# Patient Record
Sex: Female | Born: 1970 | Race: White | Hispanic: No | Marital: Married | Smoking: Current every day smoker
Health system: Southern US, Community
[De-identification: ages and names within clinical notes are randomized; demographics above are authoritative.]

## PROBLEM LIST (undated history)

## (undated) DIAGNOSIS — K209 Esophagitis, unspecified without bleeding: Secondary | ICD-10-CM

## (undated) DIAGNOSIS — F1010 Alcohol abuse, uncomplicated: Secondary | ICD-10-CM

## (undated) DIAGNOSIS — Z972 Presence of dental prosthetic device (complete) (partial): Secondary | ICD-10-CM

## (undated) DIAGNOSIS — I739 Peripheral vascular disease, unspecified: Secondary | ICD-10-CM

## (undated) DIAGNOSIS — I214 Non-ST elevation (NSTEMI) myocardial infarction: Secondary | ICD-10-CM

## (undated) DIAGNOSIS — Z85810 Personal history of malignant neoplasm of tongue: Secondary | ICD-10-CM

## (undated) DIAGNOSIS — Z87898 Personal history of other specified conditions: Secondary | ICD-10-CM

## (undated) DIAGNOSIS — Z955 Presence of coronary angioplasty implant and graft: Secondary | ICD-10-CM

## (undated) DIAGNOSIS — Z720 Tobacco use: Secondary | ICD-10-CM

## (undated) HISTORY — PX: TONGUE SURGERY: SHX810

## (undated) HISTORY — PX: BILATERAL SALPINGECTOMY: SHX5743

## (undated) HISTORY — DX: Peripheral vascular disease, unspecified: I73.9

---

## 1998-04-29 ENCOUNTER — Emergency Department (HOSPITAL_COMMUNITY): Admission: EM | Admit: 1998-04-29 | Discharge: 1998-04-29 | Payer: Self-pay | Admitting: Emergency Medicine

## 1998-05-06 ENCOUNTER — Emergency Department (HOSPITAL_COMMUNITY): Admission: EM | Admit: 1998-05-06 | Discharge: 1998-05-06 | Payer: Self-pay | Admitting: Emergency Medicine

## 2000-02-16 ENCOUNTER — Other Ambulatory Visit: Admission: RE | Admit: 2000-02-16 | Discharge: 2000-02-16 | Payer: Self-pay | Admitting: Obstetrics

## 2002-03-15 ENCOUNTER — Other Ambulatory Visit: Admission: RE | Admit: 2002-03-15 | Discharge: 2002-03-15 | Payer: Self-pay | Admitting: Family Medicine

## 2007-06-23 ENCOUNTER — Emergency Department (HOSPITAL_COMMUNITY): Admission: EM | Admit: 2007-06-23 | Discharge: 2007-06-23 | Payer: Self-pay | Admitting: Emergency Medicine

## 2017-07-15 DIAGNOSIS — I10 Essential (primary) hypertension: Secondary | ICD-10-CM | POA: Insufficient documentation

## 2017-07-15 DIAGNOSIS — R002 Palpitations: Secondary | ICD-10-CM | POA: Insufficient documentation

## 2017-07-15 DIAGNOSIS — Z87898 Personal history of other specified conditions: Secondary | ICD-10-CM | POA: Insufficient documentation

## 2017-07-15 HISTORY — DX: Palpitations: R00.2

## 2017-07-16 DIAGNOSIS — I4719 Other supraventricular tachycardia: Secondary | ICD-10-CM | POA: Insufficient documentation

## 2017-07-16 HISTORY — DX: Other supraventricular tachycardia: I47.19

## 2017-08-01 DIAGNOSIS — R6510 Systemic inflammatory response syndrome (SIRS) of non-infectious origin without acute organ dysfunction: Secondary | ICD-10-CM | POA: Insufficient documentation

## 2017-08-01 HISTORY — DX: Systemic inflammatory response syndrome (sirs) of non-infectious origin without acute organ dysfunction: R65.10

## 2017-09-30 DIAGNOSIS — Z860100 Personal history of colon polyps, unspecified: Secondary | ICD-10-CM | POA: Insufficient documentation

## 2017-11-24 DIAGNOSIS — Z87898 Personal history of other specified conditions: Secondary | ICD-10-CM | POA: Insufficient documentation

## 2017-11-24 DIAGNOSIS — I639 Cerebral infarction, unspecified: Secondary | ICD-10-CM | POA: Insufficient documentation

## 2017-12-02 DIAGNOSIS — G629 Polyneuropathy, unspecified: Secondary | ICD-10-CM | POA: Insufficient documentation

## 2017-12-02 DIAGNOSIS — I6783 Posterior reversible encephalopathy syndrome: Secondary | ICD-10-CM

## 2017-12-02 HISTORY — DX: Polyneuropathy, unspecified: G62.9

## 2017-12-02 HISTORY — DX: Posterior reversible encephalopathy syndrome: I67.83

## 2017-12-22 DIAGNOSIS — R569 Unspecified convulsions: Secondary | ICD-10-CM | POA: Insufficient documentation

## 2017-12-22 HISTORY — DX: Unspecified convulsions: R56.9

## 2018-01-01 DIAGNOSIS — F39 Unspecified mood [affective] disorder: Secondary | ICD-10-CM | POA: Insufficient documentation

## 2018-01-01 DIAGNOSIS — R45851 Suicidal ideations: Secondary | ICD-10-CM | POA: Insufficient documentation

## 2018-01-01 DIAGNOSIS — F339 Major depressive disorder, recurrent, unspecified: Secondary | ICD-10-CM

## 2018-01-01 HISTORY — DX: Major depressive disorder, recurrent, unspecified: F33.9

## 2018-01-01 HISTORY — DX: Suicidal ideations: R45.851

## 2018-01-01 HISTORY — DX: Unspecified mood (affective) disorder: F39

## 2018-01-21 ENCOUNTER — Ambulatory Visit: Payer: Self-pay | Admitting: Physician Assistant

## 2018-06-27 DIAGNOSIS — C040 Malignant neoplasm of anterior floor of mouth: Secondary | ICD-10-CM | POA: Insufficient documentation

## 2018-07-15 DIAGNOSIS — C049 Malignant neoplasm of floor of mouth, unspecified: Secondary | ICD-10-CM

## 2018-07-15 HISTORY — DX: Malignant neoplasm of floor of mouth, unspecified: C04.9

## 2021-09-04 DIAGNOSIS — I2510 Atherosclerotic heart disease of native coronary artery without angina pectoris: Secondary | ICD-10-CM

## 2021-09-04 HISTORY — DX: Atherosclerotic heart disease of native coronary artery without angina pectoris: I25.10

## 2021-09-07 ENCOUNTER — Emergency Department: Payer: 59

## 2021-09-07 ENCOUNTER — Encounter: Payer: Self-pay | Admitting: Emergency Medicine

## 2021-09-07 ENCOUNTER — Other Ambulatory Visit: Payer: Self-pay

## 2021-09-07 ENCOUNTER — Inpatient Hospital Stay
Admission: EM | Admit: 2021-09-07 | Discharge: 2021-09-11 | DRG: 247 | Disposition: A | Discharge status: Home / Self Care | Payer: 59 | Attending: Hospitalist | Admitting: Hospitalist

## 2021-09-07 DIAGNOSIS — Z85810 Personal history of malignant neoplasm of tongue: Secondary | ICD-10-CM

## 2021-09-07 DIAGNOSIS — F1010 Alcohol abuse, uncomplicated: Secondary | ICD-10-CM

## 2021-09-07 DIAGNOSIS — E871 Hypo-osmolality and hyponatremia: Secondary | ICD-10-CM

## 2021-09-07 DIAGNOSIS — I25111 Atherosclerotic heart disease of native coronary artery with angina pectoris with documented spasm: Principal | ICD-10-CM

## 2021-09-07 DIAGNOSIS — R1084 Generalized abdominal pain: Secondary | ICD-10-CM | POA: Diagnosis not present

## 2021-09-07 DIAGNOSIS — Z79899 Other long term (current) drug therapy: Secondary | ICD-10-CM

## 2021-09-07 DIAGNOSIS — I2584 Coronary atherosclerosis due to calcified coronary lesion: Secondary | ICD-10-CM | POA: Diagnosis present

## 2021-09-07 DIAGNOSIS — F419 Anxiety disorder, unspecified: Secondary | ICD-10-CM | POA: Diagnosis present

## 2021-09-07 DIAGNOSIS — Z20822 Contact with and (suspected) exposure to covid-19: Secondary | ICD-10-CM | POA: Diagnosis present

## 2021-09-07 DIAGNOSIS — L7632 Postprocedural hematoma of skin and subcutaneous tissue following other procedure: Secondary | ICD-10-CM | POA: Diagnosis not present

## 2021-09-07 DIAGNOSIS — E162 Hypoglycemia, unspecified: Secondary | ICD-10-CM

## 2021-09-07 DIAGNOSIS — M79605 Pain in left leg: Secondary | ICD-10-CM

## 2021-09-07 DIAGNOSIS — I2510 Atherosclerotic heart disease of native coronary artery without angina pectoris: Secondary | ICD-10-CM

## 2021-09-07 DIAGNOSIS — K551 Chronic vascular disorders of intestine: Secondary | ICD-10-CM

## 2021-09-07 DIAGNOSIS — Z716 Tobacco abuse counseling: Secondary | ICD-10-CM

## 2021-09-07 DIAGNOSIS — R710 Precipitous drop in hematocrit: Secondary | ICD-10-CM | POA: Diagnosis not present

## 2021-09-07 DIAGNOSIS — K760 Fatty (change of) liver, not elsewhere classified: Secondary | ICD-10-CM

## 2021-09-07 DIAGNOSIS — E872 Acidosis, unspecified: Secondary | ICD-10-CM

## 2021-09-07 DIAGNOSIS — I10 Essential (primary) hypertension: Secondary | ICD-10-CM | POA: Diagnosis present

## 2021-09-07 DIAGNOSIS — I739 Peripheral vascular disease, unspecified: Secondary | ICD-10-CM

## 2021-09-07 DIAGNOSIS — Z758 Other problems related to medical facilities and other health care: Secondary | ICD-10-CM | POA: Diagnosis not present

## 2021-09-07 DIAGNOSIS — F17200 Nicotine dependence, unspecified, uncomplicated: Secondary | ICD-10-CM

## 2021-09-07 DIAGNOSIS — I200 Unstable angina: Secondary | ICD-10-CM

## 2021-09-07 DIAGNOSIS — R109 Unspecified abdominal pain: Secondary | ICD-10-CM | POA: Diagnosis present

## 2021-09-07 DIAGNOSIS — E785 Hyperlipidemia, unspecified: Secondary | ICD-10-CM | POA: Diagnosis present

## 2021-09-07 DIAGNOSIS — F17210 Nicotine dependence, cigarettes, uncomplicated: Secondary | ICD-10-CM | POA: Diagnosis present

## 2021-09-07 DIAGNOSIS — K2970 Gastritis, unspecified, without bleeding: Secondary | ICD-10-CM | POA: Diagnosis present

## 2021-09-07 DIAGNOSIS — M79604 Pain in right leg: Secondary | ICD-10-CM

## 2021-09-07 DIAGNOSIS — G629 Polyneuropathy, unspecified: Secondary | ICD-10-CM | POA: Diagnosis present

## 2021-09-07 DIAGNOSIS — R079 Chest pain, unspecified: Principal | ICD-10-CM

## 2021-09-07 DIAGNOSIS — Z880 Allergy status to penicillin: Secondary | ICD-10-CM

## 2021-09-07 HISTORY — DX: Personal history of malignant neoplasm of tongue: Z85.810

## 2021-09-07 HISTORY — DX: Alcohol abuse, uncomplicated: F10.10

## 2021-09-07 HISTORY — DX: Tobacco use: Z72.0

## 2021-09-07 LAB — CBC WITH DIFFERENTIAL/PLATELET
Abs Immature Granulocytes: 0.02 10*3/uL (ref 0.00–0.07)
Basophils Absolute: 0.1 10*3/uL (ref 0.0–0.1)
Basophils Relative: 1 %
Eosinophils Absolute: 0.1 10*3/uL (ref 0.0–0.5)
Eosinophils Relative: 1 %
HCT: 41.2 % (ref 36.0–46.0)
Hemoglobin: 14.4 g/dL (ref 12.0–15.0)
Immature Granulocytes: 0 %
Lymphocytes Relative: 21 %
Lymphs Abs: 1.9 10*3/uL (ref 0.7–4.0)
MCH: 33.2 pg (ref 26.0–34.0)
MCHC: 35 g/dL (ref 30.0–36.0)
MCV: 94.9 fL (ref 80.0–100.0)
Monocytes Absolute: 0.5 10*3/uL (ref 0.1–1.0)
Monocytes Relative: 6 %
Neutro Abs: 6.3 10*3/uL (ref 1.7–7.7)
Neutrophils Relative %: 71 %
Platelets: 338 10*3/uL (ref 150–400)
RBC: 4.34 MIL/uL (ref 3.87–5.11)
RDW: 13 % (ref 11.5–15.5)
WBC: 8.9 10*3/uL (ref 4.0–10.5)
nRBC: 0 % (ref 0.0–0.2)

## 2021-09-07 LAB — COMPREHENSIVE METABOLIC PANEL
ALT: 74 U/L — ABNORMAL HIGH (ref 0–44)
AST: 121 U/L — ABNORMAL HIGH (ref 15–41)
Albumin: 4.7 g/dL (ref 3.5–5.0)
Alkaline Phosphatase: 76 U/L (ref 38–126)
Anion gap: 10 (ref 5–15)
BUN: 6 mg/dL (ref 6–20)
CO2: 22 mmol/L (ref 22–32)
Calcium: 9.1 mg/dL (ref 8.9–10.3)
Chloride: 100 mmol/L (ref 98–111)
Creatinine, Ser: 0.6 mg/dL (ref 0.44–1.00)
GFR, Estimated: >60 mL/min (ref >60)
Glucose, Bld: 124 mg/dL — ABNORMAL HIGH (ref 70–99)
Potassium: 3.6 mmol/L (ref 3.5–5.1)
Sodium: 132 mmol/L — ABNORMAL LOW (ref 135–145)
Total Bilirubin: 1 mg/dL (ref 0.3–1.2)
Total Protein: 8.2 g/dL — ABNORMAL HIGH (ref 6.5–8.1)

## 2021-09-07 LAB — BRAIN NATRIURETIC PEPTIDE: B Natriuretic Peptide: 30.4 pg/mL (ref 0.0–100.0)

## 2021-09-07 LAB — URINALYSIS, ROUTINE W REFLEX MICROSCOPIC
Bilirubin Urine: NEGATIVE
Glucose, UA: NEGATIVE mg/dL
Hgb urine dipstick: NEGATIVE
Ketones, ur: NEGATIVE mg/dL
Leukocytes,Ua: NEGATIVE
Nitrite: NEGATIVE
Protein, ur: NEGATIVE mg/dL
Specific Gravity, Urine: 1.001 — ABNORMAL LOW (ref 1.005–1.030)
pH: 6 (ref 5.0–8.0)

## 2021-09-07 LAB — LIPASE, BLOOD: Lipase: 33 U/L (ref 11–51)

## 2021-09-07 LAB — APTT: aPTT: 31 seconds (ref 24–36)

## 2021-09-07 LAB — PROTIME-INR
INR: 1 (ref 0.8–1.2)
Prothrombin Time: 13.1 seconds (ref 11.4–15.2)

## 2021-09-07 LAB — MAGNESIUM: Magnesium: 2.1 mg/dL (ref 1.7–2.4)

## 2021-09-07 LAB — SARS CORONAVIRUS 2 BY RT PCR: SARS Coronavirus 2 by RT PCR: NEGATIVE

## 2021-09-07 LAB — TROPONIN I (HIGH SENSITIVITY)
Troponin I (High Sensitivity): 6 ng/L (ref <18)
Troponin I (High Sensitivity): 7 ng/L (ref <18)

## 2021-09-07 LAB — POC URINE PREG, ED: Preg Test, Ur: NEGATIVE

## 2021-09-07 MED ORDER — MORPHINE SULFATE (PF) 2 MG/ML IV SOLN
2.0000 mg | INTRAVENOUS | Status: DC | PRN
  Administered 2021-09-08 (×2): 2 mg via INTRAVENOUS
  Filled 2021-09-07 (×3): qty 1

## 2021-09-07 MED ORDER — HEPARIN BOLUS VIA INFUSION
2800.0000 [IU] | Freq: Once | INTRAVENOUS | Status: AC
Start: 2021-09-07 — End: 2021-09-07
  Administered 2021-09-07: 2800 [IU] via INTRAVENOUS
  Filled 2021-09-07: qty 2800

## 2021-09-07 MED ORDER — NITROGLYCERIN 2 % TD OINT: 1.0000 [in_us] | TOPICAL_OINTMENT | Freq: Four times a day (QID) | TRANSDERMAL | Status: DC | PRN

## 2021-09-07 MED ORDER — MORPHINE SULFATE (PF) 2 MG/ML IV SOLN
2.0000 mg | INTRAVENOUS | Status: DC | PRN
Start: 2021-09-07 — End: 2021-09-07

## 2021-09-07 MED ORDER — HEPARIN (PORCINE) 25000 UT/250ML-% IV SOLN
850.0000 [IU]/h | INTRAVENOUS | Status: DC
  Administered 2021-09-07: 600 [IU]/h via INTRAVENOUS
  Filled 2021-09-07 (×2): qty 250

## 2021-09-07 MED ORDER — SODIUM CHLORIDE 0.9% FLUSH
3.0000 mL | Freq: Two times a day (BID) | INTRAVENOUS | Status: DC
Start: 2021-09-07 — End: 2021-09-11
  Administered 2021-09-07 – 2021-09-10 (×4): 3 mL via INTRAVENOUS

## 2021-09-07 MED ORDER — SENNOSIDES-DOCUSATE SODIUM 8.6-50 MG PO TABS: 1.0000 | ORAL_TABLET | Freq: Two times a day (BID) | ORAL | Status: DC | PRN

## 2021-09-07 MED ORDER — ACETAMINOPHEN 325 MG PO TABS
650.0000 mg | ORAL_TABLET | Freq: Four times a day (QID) | ORAL | Status: AC | PRN
  Filled 2021-09-07: qty 2

## 2021-09-07 MED ORDER — LORAZEPAM 2 MG/ML IJ SOLN: 2.0000 mg | INTRAMUSCULAR | Status: DC | PRN

## 2021-09-07 MED ORDER — ONDANSETRON HCL 4 MG/2ML IJ SOLN
4.0000 mg | Freq: Four times a day (QID) | INTRAMUSCULAR | Status: DC | PRN
  Administered 2021-09-08: 4 mg via INTRAVENOUS
  Filled 2021-09-07 (×2): qty 2

## 2021-09-07 MED ORDER — IOHEXOL 350 MG/ML SOLN
80.0000 mL | Freq: Once | INTRAVENOUS | Status: AC | PRN
  Administered 2021-09-07: 80 mL via INTRAVENOUS

## 2021-09-07 MED ORDER — ACETAMINOPHEN 650 MG RE SUPP: 650.0000 mg | Freq: Four times a day (QID) | RECTAL | Status: AC | PRN

## 2021-09-07 MED ORDER — ASPIRIN 81 MG PO CHEW
324.0000 mg | CHEWABLE_TABLET | Freq: Once | ORAL | Status: AC
  Administered 2021-09-07: 324 mg via ORAL
  Filled 2021-09-07: qty 4

## 2021-09-07 MED ORDER — NICOTINE 14 MG/24HR TD PT24: 14.0000 mg | MEDICATED_PATCH | Freq: Every day | TRANSDERMAL | Status: DC | PRN

## 2021-09-07 MED ORDER — ONDANSETRON HCL 4 MG PO TABS: 4.0000 mg | ORAL_TABLET | Freq: Four times a day (QID) | ORAL | Status: DC | PRN

## 2021-09-07 MED ORDER — FENTANYL CITRATE PF 50 MCG/ML IJ SOSY
25.0000 ug | PREFILLED_SYRINGE | INTRAMUSCULAR | Status: DC | PRN
Start: 2021-09-07 — End: 2021-09-07

## 2021-09-07 MED ORDER — LORAZEPAM 2 MG/ML IJ SOLN
2.0000 mg | INTRAMUSCULAR | Status: AC | PRN
  Administered 2021-09-08: 2 mg via INTRAVENOUS
  Filled 2021-09-07: qty 1

## 2021-09-07 MED ORDER — LORAZEPAM 2 MG/ML IJ SOLN
1.0000 mg | Freq: Once | INTRAMUSCULAR | Status: AC
  Administered 2021-09-07: 1 mg via INTRAVENOUS
  Filled 2021-09-07: qty 1

## 2021-09-07 NOTE — Assessment & Plan Note (Signed)
-   Bilateral leg pain and cramping that is worse with ambulation - Bilateral lower extremity ABI to assess for PAD

## 2021-09-07 NOTE — Assessment & Plan Note (Signed)
TOC consulted. 

## 2021-09-07 NOTE — Consult Note (Signed)
ANTICOAGULATION CONSULT NOTE   Pharmacy Consult for heparin Indication: chest pain/ACS  Allergies  Allergen Reactions   Amoxicillin Rash    Patient Measurements: Height: '5\' 2"'$  (157.5 cm) Weight: 47.6 kg (104 lb 15 oz) IBW/kg (Calculated) : 50.1 Heparin Dosing Weight: 47.6 kg  Vital Signs: Temp: 97.9 F (36.6 C) (06/04 1054) Temp Source: Oral (06/04 1054) BP: 102/65 (06/04 1400) Pulse Rate: 68 (06/04 1415)  Labs: Recent Labs    09/07/21 1125 09/07/21 1308  HGB 14.4  --   HCT 41.2  --   PLT 338  --   CREATININE 0.60  --   TROPONINIHS 6 7    Estimated Creatinine Clearance: 64.6 mL/min (by C-G formula based on SCr of 0.6 mg/dL).   Medical History: History reviewed. No pertinent past medical history.  Medications:  No PTA anticoagulation or antiplatelet therapy   Assessment: 51 y.o. female presenting to the ED with complaints of constant but progressively worsening epigastric and lower chest pain over the last several months. Pharmacy has been consulted for heparin dosing for ACS.   Baseline labs: Hgb: 14.4, plts 338, aPTT 31, INR 1.0  Goal of Therapy:  Heparin level 0.3-0.7 units/ml Monitor platelets by anticoagulation protocol: Yes   Plan:  Give 2800 units bolus x 1 Start heparin infusion at 600 units/hr Check anti-Xa level in 6 hours and daily while on heparin Continue to monitor H&H and platelets  Darnelle Bos, PharmD 09/07/2021,3:11 PM

## 2021-09-07 NOTE — Consult Note (Signed)
Sonya Harmon is a 51 y.o. female  [id number]  Primary Cardiologist: Neoma Laming. MD  Reason for Consultation: chest pain, abnormal EKG  HPI: Sonya Harmon is a 51 y.o. female here with multiple complaints.  The patient states that over the last several months, she has had fairly constant but progressively worsening epigastric and lower chest pain.  The pain is fairly sharp and stabbing in nature.  It is intermittently worse but has been increasingly severe and constant over the last week or so.  She has had some associated shortness of breath, generalized weakness.  She has had significant dyspnea with exertion and feels like she is having difficulty getting around as well.  She states that she does not regularly see doctors and has been trying to put this off to see if it would get better, but symptoms have gotten severe enough that she finally presents for evaluation.  Denies history of coronary disease.  She does have a history of fairly regular alcohol use, and reports a history of peripheral neuropathy related to this.  No leg swelling.   Review of Systems: currently denies chest pain, shortness of breath dizziness.    History reviewed. No pertinent past medical history.  (Not in a hospital admission)     aspirin  324 mg Oral Once    Infusions:   Allergies  Allergen Reactions   Amoxicillin Rash    Social History   Socioeconomic History   Marital status: Married    Spouse name: Not on file   Number of children: Not on file   Years of education: Not on file   Highest education level: Not on file  Occupational History   Not on file  Tobacco Use   Smoking status: Every Day    Types: Cigarettes   Smokeless tobacco: Current  Substance and Sexual Activity   Alcohol use: Not on file   Drug use: Not on file   Sexual activity: Not on file  Other Topics Concern   Not on file  Social History Narrative   Not on file   Social Determinants of Health   Financial  Resource Strain: Not on file  Food Insecurity: Not on file  Transportation Needs: Not on file  Physical Activity: Not on file  Stress: Not on file  Social Connections: Not on file  Intimate Partner Violence: Not on file    History reviewed. No pertinent family history.  PHYSICAL EXAM: Vitals:   09/07/21 1400 09/07/21 1415  BP: 102/65   Pulse: 63 68  Resp: 18 15  Temp:    SpO2: 97% 100%    No intake or output data in the 24 hours ending 09/07/21 1453  General:  Well appearing. No respiratory difficulty HEENT: normal Neck: supple. no JVD. Carotids 2+ bilat; no bruits. No lymphadenopathy or thryomegaly appreciated. Cor: PMI nondisplaced. Regular rate & rhythm. No rubs, gallops or murmurs. Lungs: clear Abdomen: soft, nontender, nondistended. No hepatosplenomegaly. No bruits or masses. Good bowel sounds. Extremities: no cyanosis, clubbing, rash, edema Neuro: alert & oriented x 3, cranial nerves grossly intact. moves all 4 extremities w/o difficulty. Affect pleasant.  ECG: sinus rhythm, HR 86 bpm, ST elevation noted in the inferior leads as well as T wave inversion in 1 and aVL  Results for orders placed or performed during the hospital encounter of 09/07/21 (from the past 24 hour(s))  CBC with Differential     Status: None   Collection Time: 09/07/21 11:25 AM  Result Value Ref  Range   WBC 8.9 4.0 - 10.5 K/uL   RBC 4.34 3.87 - 5.11 MIL/uL   Hemoglobin 14.4 12.0 - 15.0 g/dL   HCT 41.2 36.0 - 46.0 %   MCV 94.9 80.0 - 100.0 fL   MCH 33.2 26.0 - 34.0 pg   MCHC 35.0 30.0 - 36.0 g/dL   RDW 13.0 11.5 - 15.5 %   Platelets 338 150 - 400 K/uL   nRBC 0.0 0.0 - 0.2 %   Neutrophils Relative % 71 %   Neutro Abs 6.3 1.7 - 7.7 K/uL   Lymphocytes Relative 21 %   Lymphs Abs 1.9 0.7 - 4.0 K/uL   Monocytes Relative 6 %   Monocytes Absolute 0.5 0.1 - 1.0 K/uL   Eosinophils Relative 1 %   Eosinophils Absolute 0.1 0.0 - 0.5 K/uL   Basophils Relative 1 %   Basophils Absolute 0.1 0.0 - 0.1  K/uL   Immature Granulocytes 0 %   Abs Immature Granulocytes 0.02 0.00 - 0.07 K/uL  Comprehensive metabolic panel     Status: Abnormal   Collection Time: 09/07/21 11:25 AM  Result Value Ref Range   Sodium 132 (L) 135 - 145 mmol/L   Potassium 3.6 3.5 - 5.1 mmol/L   Chloride 100 98 - 111 mmol/L   CO2 22 22 - 32 mmol/L   Glucose, Bld 124 (H) 70 - 99 mg/dL   BUN 6 6 - 20 mg/dL   Creatinine, Ser 0.60 0.44 - 1.00 mg/dL   Calcium 9.1 8.9 - 10.3 mg/dL   Total Protein 8.2 (H) 6.5 - 8.1 g/dL   Albumin 4.7 3.5 - 5.0 g/dL   AST 121 (H) 15 - 41 U/L   ALT 74 (H) 0 - 44 U/L   Alkaline Phosphatase 76 38 - 126 U/L   Total Bilirubin 1.0 0.3 - 1.2 mg/dL   GFR, Estimated >60 >60 mL/min   Anion gap 10 5 - 15  Brain natriuretic peptide     Status: None   Collection Time: 09/07/21 11:25 AM  Result Value Ref Range   B Natriuretic Peptide 30.4 0.0 - 100.0 pg/mL  Troponin I (High Sensitivity)     Status: None   Collection Time: 09/07/21 11:25 AM  Result Value Ref Range   Troponin I (High Sensitivity) 6 <18 ng/L  Magnesium     Status: None   Collection Time: 09/07/21 11:25 AM  Result Value Ref Range   Magnesium 2.1 1.7 - 2.4 mg/dL  Lipase, blood     Status: None   Collection Time: 09/07/21 11:25 AM  Result Value Ref Range   Lipase 33 11 - 51 U/L  POC Urine Pregnancy, ED     Status: None   Collection Time: 09/07/21  1:03 PM  Result Value Ref Range   Preg Test, Ur NEGATIVE NEGATIVE  Urinalysis, Routine w reflex microscopic Urine, Clean Catch     Status: Abnormal   Collection Time: 09/07/21  1:08 PM  Result Value Ref Range   Color, Urine COLORLESS (A) YELLOW   APPearance CLEAR (A) CLEAR   Specific Gravity, Urine 1.001 (L) 1.005 - 1.030   pH 6.0 5.0 - 8.0   Glucose, UA NEGATIVE NEGATIVE mg/dL   Hgb urine dipstick NEGATIVE NEGATIVE   Bilirubin Urine NEGATIVE NEGATIVE   Ketones, ur NEGATIVE NEGATIVE mg/dL   Protein, ur NEGATIVE NEGATIVE mg/dL   Nitrite NEGATIVE NEGATIVE   Leukocytes,Ua NEGATIVE  NEGATIVE  Troponin I (High Sensitivity)     Status: None  Collection Time: 09/07/21  1:08 PM  Result Value Ref Range   Troponin I (High Sensitivity) 7 <18 ng/L   DG Chest Portable 1 View  Result Date: 09/07/2021 CLINICAL DATA:  Chest pain EXAM: PORTABLE CHEST 1 VIEW COMPARISON:  None Available. FINDINGS: The cardiomediastinal silhouette is normal in contour. No pleural effusion. No pneumothorax. No acute pleuroparenchymal abnormality. Visualized abdomen is unremarkable. IMPRESSION: No acute cardiopulmonary abnormality. Electronically Signed   By: Valentino Saxon M.D.   On: 09/07/2021 12:00   CT Angio Chest/Abd/Pel for Dissection W and/or Wo Contrast  Result Date: 09/07/2021 CLINICAL DATA:  51 year old female with acute chest, abdominal and pelvic pain. Evaluate aorta. EXAM: CT ANGIOGRAPHY CHEST, ABDOMEN AND PELVIS TECHNIQUE: Non-contrast CT of the chest was initially obtained. Multidetector CT imaging through the chest, abdomen and pelvis was performed using the standard protocol during bolus administration of intravenous contrast. Multiplanar reconstructed images and MIPs were obtained and reviewed to evaluate the vascular anatomy. RADIATION DOSE REDUCTION: This exam was performed according to the departmental dose-optimization program which includes automated exposure control, adjustment of the mA and/or kV according to patient size and/or use of iterative reconstruction technique. CONTRAST:  67m OMNIPAQUE IOHEXOL 350 MG/ML SOLN COMPARISON:  09/07/2021 chest radiograph FINDINGS: CTA CHEST FINDINGS Cardiovascular: Moderate atherosclerotic plaque within the descending thoracic aorta noted. There is no evidence of thoracic aortic aneurysm or dissection. No pulmonary emboli are identified. Heart size is normal. No pericardial effusion identified. Coronary artery atherosclerotic calcifications are present. Approximally 50% stenosis of the proximal LEFT subclavian artery noted. Mediastinum/Nodes: An 8 mm  probable lymph node in the anterior mediastinum/pre-vascular region noted (series 4: Image 25). Lungs/Pleura: Lungs are clear. No pleural effusion or pneumothorax. Musculoskeletal: No acute or suspicious bony abnormalities are noted. Remote LEFT rib fractures noted. Review of the MIP images confirms the above findings. CTA ABDOMEN AND PELVIS FINDINGS VASCULAR Aorta: Heavy atherosclerotic plaque/calcification of the abdominal aorta noted without aneurysm or dissection. Celiac: Patent without evidence of aneurysm, dissection, vasculitis or significant stenosis. SMA: Approximately 50% stenosis of the proximal SMA noted from atherosclerotic plaque. No evidence of aneurysm. Renals: Approximately 50% stenosis of the proximal LEFT renal artery noted. No other significant abnormalities. IMA: Patent without evidence of aneurysm, dissection, vasculitis or significant stenosis. Inflow: Moderate atherosclerotic plaque and calcifications noted in the common, external, and internal iliac arteries. 50-80% stenosis of portions of the portions of the LEFT common iliac, bilateral external iliac and bilateral internal iliac arteries noted. Veins: No obvious venous abnormality within the limitations of this arterial phase study. Review of the MIP images confirms the above findings. NON-VASCULAR Hepatobiliary: Hepatic steatosis noted. No focal hepatic abnormalities are identified. The gallbladder is unremarkable. There is no evidence of intrahepatic or extrahepatic biliary dilatation. Pancreas: Unremarkable Spleen: Unremarkable Adrenals/Urinary Tract: The kidneys, adrenal glands and bladder are unremarkable. Stomach/Bowel: Stomach is within normal limits. Appendix appears normal. No evidence of bowel wall thickening, distention, or inflammatory changes. Lymphatic: No abnormal lymph nodes identified. Reproductive: Uterus and bilateral adnexa are unremarkable. Other: No ascites, focal collection or pneumoperitoneum. Musculoskeletal: No  acute or suspicious bony abnormalities are noted. Review of the MIP images confirms the above findings. IMPRESSION: 1. No evidence of aortic aneurysm or dissection. No evidence of pulmonary emboli. 2. 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 3. 50-80% stenosis of portions of the LEFT common iliac, bilateral external iliac and bilateral internal iliac arteries. 4. Hepatic steatosis. 5. Coronary artery disease and aortic  Atherosclerosis (ICD10-I70.0). Electronically Signed   By: Margarette Canada M.D.   On: 09/07/2021 14:29     ASSESSMENT AND PLAN: Patient presented to ED complaining of left sided chest pressure and abdominal pain. Denies shortness of brain. Patient reports history of tobacco use, alcohol use, HTN, HLD. Denies DM, family history of known CAD. Troponin levels within normal range. Abnormal EKG with elevated T waves in setting of chest pain. Will schedule for cardiac cath for tomorrow. Procedure explained to patient, she is agreeable to proceed.  Engineer, drilling FNP-C

## 2021-09-07 NOTE — Hospital Course (Signed)
Sonya Harmon is a 51 year old female with history of poor healthcare maintenance, has not seen a doctor in a long time, heavy tobacco use, heavy tobacco dependence, who presents emergency department for chief concerns of generalized abdominal pain for several months. CT scan of the chest/abdomen/pelvis with contrast showed 50% stenosis in superior mesenteric arteries.  Peripheral vascular disease. Heart catheter was performed by cardiology on 6/5, showed RCA stenosis, intervention scheduled on 6/7

## 2021-09-07 NOTE — TOC Initial Note (Signed)
Transition of Care [facility]) - Initial/Assessment Note    Patient Details  Name: Sonya Harmon MRN: [id number] Date of Birth: April 12, 1970  [facility] CM/SW Contact:    Janyth Contes, [place] Phone Number: 09/07/2021, 4:19 PM  Clinical Narrative:                  [facility] consult for PCP need. CSW attached list of PCP's in network with the patient's insurance to AVS for patient to review. CSW also attached residential and outpatient substance use resources due to tobacco use concerns.       Patient Goals and CMS Choice        Expected Discharge Plan and Services                                                Prior Living Arrangements/Services                       Activities of Daily Living      Permission Sought/Granted                  Emotional Assessment              Admission diagnosis:  Abdominal pain [R10.9] Patient Active Problem List   Diagnosis Date Noted   Abdominal pain 09/07/2021   Tobacco dependence 09/07/2021   Leg pain, bilateral 09/07/2021   Alcohol abuse 09/07/2021   Hepatic steatosis 09/07/2021   Does not have primary care provider 09/07/2021   PCP:  Pcp, No Pharmacy:  No Pharmacies Listed    Social Determinants of Health (SDOH) Interventions    Readmission Risk Interventions     View : No data to display.

## 2021-09-07 NOTE — Assessment & Plan Note (Signed)
-   Patient endorses readiness to stop tobacco use. - Greater than 5 minutes spent counseling patient on tobacco cessation - Tobacco cessation counseling:  1. Week one and two, smoke 9 cigarettes per day. Week three and four, smoke 8 cigarettes per day. Week five and six, smoke 7 cigarettes per day, continue until smoking half the amount of cigarettes per day 2. Discuss with PCP for pharmacologic assistance with smoking cessation 3. Clean all indoor clothing, sheets, blankets, and freshen textile furniture to rid the smell of cigarettes 4. Only smoke outside and wear outer covering 5. Leave cigarettes and lighters outside in separate places to make it difficult for yourself to access cigarette smoking 6. During in-between cigarettes, if you feel the urge to smoke, use the following: stress squeezing devices/phone a trusted friend to talk you through the urge/walk in a safe environment 7. Avoid prolong interactions with individuals actively smoking cigarettes 8. If you smoke in social setting, avoid social setting where cigarette smoking is expected or considered acceptable  9. Call [facility] if in need of nicotine patches to help with cessation -Patient endorses understanding and compliance

## 2021-09-07 NOTE — H&P (Signed)
History and Physical   Sonya Harmon IOE:[id number] DOB: 1970/08/11 DOA: 09/07/2021  PCP: Pcp, No  Outpatient Specialists: None Patient coming from: Home  I have personally briefly reviewed patient's old medical records in [place].  Chief Concern: Abdominal pain  HPI: [name] is a 51 year old female with history of poor healthcare maintenance, has not seen a doctor in a long time, heavy tobacco use, heavy tobacco dependence, who presents emergency department for chief concerns of generalized abdominal pain for several months.  Initial vitals in the emergency department showed temperature of 97.9, respiration rate of 18, heart rate of 87, blood pressure of 128/85, SPO2 of 96% on room air.  Serum sodium is 132, potassium 3.6, chloride 100, bicarb 22, BUN of 6, serum creatinine 0.60, GFR greater than 60, nonfasting blood glucose 124, WBC 8.9, hemoglobin 14.4, platelets of 338.  UA was negative for leukocytes or nitrates.  High sensitive troponin was 6 and increased to 7.  BNP is 30.4, pregnancy test was negative.    EKG showed inferior Q waves.  No prior EKG for comparison. EDP consulted cardiology who states that they will come and evaluate the patient.  ED treatment: Aspirin 324 mg p.o. one-time dose, Ativan 1 mg IV  At bedside, she is aao to self, age, current location, and current year.   She reports left and generalized abdominal pain that radiates to her back.  She states this started about 3 months ago.  She denies trauma to her person.  She reports the pain comes and goes and not associated with eating.  She was not able to describe consistent duration of the pain.  She reports it is worse when she bends over.  She further states that nothing makes the pain go away. She reports at the peak, the pain is a 9-10/10 and currently at bedside, the pain is 5/10. She endorses associated shortness of breath. She endorses right arm and bilateral legs.   She endorses  shortness of breath that is worse with exertion.  She also endorses lower extremity pain and cramping that is worse with ambulation.   Social history: She lives at home with her husband. She smokes tobacco, currently smoking 1/2 ppd. She started smoking at age 51. She drinks about 6 (12 oz) beers per day. She denies history of seizures or tremors. Her last drinkw as 09/06/21. She endorses smoking THC, once per week.   ROS: Constitutional: no weight change, no fever ENT/Mouth: no sore throat, no rhinorrhea Eyes: no eye pain, no vision changes Cardiovascular: no chest pain, + dyspnea,  no edema, no palpitations Respiratory: no cough, no sputum, no wheezing Gastrointestinal: no nausea, no vomiting, no diarrhea, no constipation Genitourinary: no urinary incontinence, no dysuria, no hematuria Musculoskeletal: no arthralgias, no myalgias Skin: no skin lesions, no pruritus, Neuro: + weakness, no loss of consciousness, no syncope Psych: no anxiety, no depression, + decrease appetite Heme/Lymph: no bruising, no bleeding  ED Course: Discussed with emergency medicine provider, patient requiring hospitalization for chief concerns of acute cardiac ischemia with inferior Q waves, no prior EKG for comparison.  Assessment/Plan  Principal Problem:   Abdominal pain Active Problems:   Tobacco dependence   Leg pain, bilateral   Alcohol abuse   Hepatic steatosis   Does not have primary care provider   Assessment and Plan:  * Abdominal pain - Inferior Q waves on EKG, no prior EKG for comparison - Epigastric and back pain - Query chronic mesenteric ischemia - CTA  of the chest abdomen pelvis was read as no evidence of acute aortic aneurysm or dissection.  No evidence of PE.  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  50 to 80% stenosis with portion of left common iliac and bilateral external iliac and bilateral internal iliac arteries. -EDP  consulted cardiology Dr. Humphrey Rolls, who states he will see the patient, recommends heparin GGT for possible cath - Pain management: Nitroglycerin ointment, 1 inch every 6 hours as needed for chest and abdominal pain ordered; morphine 2 to 4 mg IV every 4 hours as needed for moderate and severe pain not controlled with nitroglycerin ointment, 4 doses ordered - AM team to reassess patient at bedside for continued opioid pain requirement - Continue with n.p.o. except for sips with meds  Alcohol abuse - Extensive alcohol cessation counseling has been given at bedside - Patient states that she has never had a history of delirium tremors, seizures - She states that over the last month she has gone 3 days without drinking before - Her last EtOH drink was 09/06/2021 evening - No indication for CIWA precaution at this time as she is not within the withdrawal window - Ativan 2 mg IV as needed for seizure and anxiety, 3 days ordered with instructions to administer as appropriate and then let provider know  Leg pain, bilateral - Bilateral leg pain and cramping that is worse with ambulation - Bilateral lower extremity ABI to assess for PAD  Tobacco dependence - Patient endorses readiness to stop tobacco use. - Greater than 5 minutes spent counseling patient on tobacco cessation - Tobacco cessation counseling:  Week one and two, smoke 9 cigarettes per day. Week three and four, smoke 8 cigarettes per day. Week five and six, smoke 7 cigarettes per day, continue until smoking half the amount of cigarettes per day Discuss with PCP for pharmacologic assistance with smoking cessation Clean all indoor clothing, sheets, blankets, and freshen textile furniture to rid the smell of cigarettes Only smoke outside and wear outer covering Leave cigarettes and lighters outside in separate places to make it difficult for yourself to access cigarette smoking During in-between cigarettes, if you feel the urge to smoke, use the  following: stress squeezing devices/phone a trusted friend to talk you through the urge/walk in a safe environment Avoid prolong interactions with individuals actively smoking cigarettes If you smoke in social setting, avoid social setting where cigarette smoking is expected or considered acceptable  Call [facility] if in need of nicotine patches to help with cessation -Patient endorses understanding and compliance  Does not have primary care provider - TOC consulted  Hepatic steatosis - Presumed secondary to alcohol abuse - Alcohol cessation counseling has been given - Outpatient follow-up with PCP  Chart reviewed.   DVT prophylaxis: Heparin GTT Code Status: Full code Diet: N.p.o. except for sips of meds Family Communication: No Disposition Plan: Pending clinical course Consults called: Cardiology Admission status: Progressive cardiac, observation  Past Medical History:  Diagnosis Date   Alcohol abuse    History of tongue cancer    Tobacco abuse    Past Surgical History:  Procedure Laterality Date   BILATERAL SALPINGECTOMY     TONGUE SURGERY     Social History:  reports that she has been smoking cigarettes. She has been smoking an average of .5 packs per day. She uses smokeless tobacco. She reports current alcohol use of about 42.0 standard drinks per week. She reports current  drug use. Drug: Marijuana.  Allergies  Allergen Reactions   Amoxicillin Rash   Family History  Problem Relation Age of Onset   Cancer Mother    Cancer Father    Stroke Sister    Aneurysm Sister    Family history: Family history reviewed and not pertinent.  Prior to Admission medications   Not on File   Physical Exam: Vitals:   09/07/21 1300 09/07/21 1330 09/07/21 1400 09/07/21 1415  BP: 117/67 114/71 102/65   Pulse: 60 70 63 68  Resp: 15 (!) '21 18 15  '$ Temp:      TempSrc:      SpO2: 100% 100% 97% 100%  Weight:      Height:       Constitutional: appears age-appropriate, NAD,  calm, comfortable Eyes: PERRL, lids and conjunctivae normal ENMT: Mucous membranes are moist. Posterior pharynx clear of any exudate or lesions. Age-appropriate dentition. Hearing appropriate Neck: normal, supple, no masses, no thyromegaly Respiratory: clear to auscultation bilaterally, no wheezing, no crackles. Normal respiratory effort. No accessory muscle use.  Cardiovascular: Regular rate and rhythm, no murmurs / rubs / gallops. No extremity edema. 2+ pedal pulses. No carotid bruits.  Abdomen: Generalized abdominal tenderness, no masses palpated, no hepatosplenomegaly. Bowel sounds positive.  Musculoskeletal: no clubbing / cyanosis. No joint deformity upper and lower extremities. Good ROM, no contractures, no atrophy. Normal muscle tone.  Skin: no rashes, lesions, ulcers. No induration Neurologic: Sensation intact. Strength 5/5 in all 4.  Psychiatric: Normal judgment and insight. Alert and oriented x 3. Normal mood.   EKG: independently reviewed, showing sinus rhythm with rate of 86, inferior Q waves in leads II, III and aVF, QTc 438  Chest x-ray on Admission: I personally reviewed and I agree with radiologist reading as below.  DG Chest Portable 1 View  Result Date: 09/07/2021 CLINICAL DATA:  Chest pain EXAM: PORTABLE CHEST 1 VIEW COMPARISON:  None Available. FINDINGS: The cardiomediastinal silhouette is normal in contour. No pleural effusion. No pneumothorax. No acute pleuroparenchymal abnormality. Visualized abdomen is unremarkable. IMPRESSION: No acute cardiopulmonary abnormality. Electronically Signed   By: Valentino Saxon M.D.   On: 09/07/2021 12:00   CT Angio Chest/Abd/Pel for Dissection W and/or Wo Contrast  Result Date: 09/07/2021 CLINICAL DATA:  51 year old female with acute chest, abdominal and pelvic pain. Evaluate aorta. EXAM: CT ANGIOGRAPHY CHEST, ABDOMEN AND PELVIS TECHNIQUE: Non-contrast CT of the chest was initially obtained. Multidetector CT imaging through the chest,  abdomen and pelvis was performed using the standard protocol during bolus administration of intravenous contrast. Multiplanar reconstructed images and MIPs were obtained and reviewed to evaluate the vascular anatomy. RADIATION DOSE REDUCTION: This exam was performed according to the departmental dose-optimization program which includes automated exposure control, adjustment of the mA and/or kV according to patient size and/or use of iterative reconstruction technique. CONTRAST:  70m OMNIPAQUE IOHEXOL 350 MG/ML SOLN COMPARISON:  09/07/2021 chest radiograph FINDINGS: CTA CHEST FINDINGS Cardiovascular: Moderate atherosclerotic plaque within the descending thoracic aorta noted. There is no evidence of thoracic aortic aneurysm or dissection. No pulmonary emboli are identified. Heart size is normal. No pericardial effusion identified. Coronary artery atherosclerotic calcifications are present. Approximally 50% stenosis of the proximal LEFT subclavian artery noted. Mediastinum/Nodes: An 8 mm probable lymph node in the anterior mediastinum/pre-vascular region noted (series 4: Image 25). Lungs/Pleura: Lungs are clear. No pleural effusion or pneumothorax. Musculoskeletal: No acute or suspicious bony abnormalities are noted. Remote LEFT rib fractures noted. Review of the MIP images confirms the above findings.  CTA ABDOMEN AND PELVIS FINDINGS VASCULAR Aorta: Heavy atherosclerotic plaque/calcification of the abdominal aorta noted without aneurysm or dissection. Celiac: Patent without evidence of aneurysm, dissection, vasculitis or significant stenosis. SMA: Approximately 50% stenosis of the proximal SMA noted from atherosclerotic plaque. No evidence of aneurysm. Renals: Approximately 50% stenosis of the proximal LEFT renal artery noted. No other significant abnormalities. IMA: Patent without evidence of aneurysm, dissection, vasculitis or significant stenosis. Inflow: Moderate atherosclerotic plaque and calcifications noted in  the common, external, and internal iliac arteries. 50-80% stenosis of portions of the portions of the LEFT common iliac, bilateral external iliac and bilateral internal iliac arteries noted. Veins: No obvious venous abnormality within the limitations of this arterial phase study. Review of the MIP images confirms the above findings. NON-VASCULAR Hepatobiliary: Hepatic steatosis noted. No focal hepatic abnormalities are identified. The gallbladder is unremarkable. There is no evidence of intrahepatic or extrahepatic biliary dilatation. Pancreas: Unremarkable Spleen: Unremarkable Adrenals/Urinary Tract: The kidneys, adrenal glands and bladder are unremarkable. Stomach/Bowel: Stomach is within normal limits. Appendix appears normal. No evidence of bowel wall thickening, distention, or inflammatory changes. Lymphatic: No abnormal lymph nodes identified. Reproductive: Uterus and bilateral adnexa are unremarkable. Other: No ascites, focal collection or pneumoperitoneum. Musculoskeletal: No acute or suspicious bony abnormalities are noted. Review of the MIP images confirms the above findings. IMPRESSION: 1. No evidence of aortic aneurysm or dissection. No evidence of pulmonary emboli. 2. 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 3. 50-80% stenosis of portions of the LEFT common iliac, bilateral external iliac and bilateral internal iliac arteries. 4. Hepatic steatosis. 5. Coronary artery disease and aortic Atherosclerosis (ICD10-I70.0). Electronically Signed   By: Margarette Canada M.D.   On: 09/07/2021 14:29    Labs on Admission: I have personally reviewed following labs  CBC: Recent Labs  Lab 09/07/21 1125  WBC 8.9  NEUTROABS 6.3  HGB 14.4  HCT 41.2  MCV 94.9  PLT 756   Basic Metabolic Panel: Recent Labs  Lab 09/07/21 1125  NA 132*  K 3.6  CL 100  CO2 22  GLUCOSE 124*  BUN 6  CREATININE 0.60  CALCIUM 9.1  MG 2.1   GFR: Estimated  Creatinine Clearance: 64.6 mL/min (by C-G formula based on SCr of 0.6 mg/dL).  Liver Function Tests: Recent Labs  Lab 09/07/21 1125  AST 121*  ALT 74*  ALKPHOS 76  BILITOT 1.0  PROT 8.2*  ALBUMIN 4.7   Recent Labs  Lab 09/07/21 1125  LIPASE 33   Urine analysis:    Component Value Date/Time   COLORURINE COLORLESS (A) 09/07/2021 1308   APPEARANCEUR CLEAR (A) 09/07/2021 1308   LABSPEC 1.001 (L) 09/07/2021 1308   PHURINE 6.0 09/07/2021 1308   GLUCOSEU NEGATIVE 09/07/2021 1308   HGBUR NEGATIVE 09/07/2021 1308   BILIRUBINUR NEGATIVE 09/07/2021 1308   KETONESUR NEGATIVE 09/07/2021 1308   PROTEINUR NEGATIVE 09/07/2021 1308   NITRITE NEGATIVE 09/07/2021 1308   LEUKOCYTESUR NEGATIVE 09/07/2021 1308   Dr. Tobie Poet [facility]  If 7PM-7AM, please contact overnight-coverage provider If 7AM-7PM, please contact day coverage provider [email or web address]  09/07/2021, 4:03 PM

## 2021-09-07 NOTE — ED Triage Notes (Signed)
Pt reports severe abd pain for the last 3 months or longer. Pt reports pain is sharp and stabbing in nature and intermittent. Pt reports pain is over her abd in general. Pt also reports no appetite.

## 2021-09-07 NOTE — ED Provider Notes (Signed)
[facility] Provider Note    Event Date/Time   First MD Initiated Contact with Patient 09/07/21 1102     (approximate)   History   Abdominal Pain   HPI  Sonya Harmon is a 51 y.o. female   here with multiple complaints.  The patient states that over the last several months, she has had fairly constant but progressively worsening epigastric and lower chest pain.  The pain is fairly sharp and stabbing in nature.  It is intermittently worse but has been increasingly severe and constant over the last week or so.  She has had some associated shortness of breath, generalized weakness.  She has had significant dyspnea with exertion and feels like she is having difficulty getting around as well.  She states that she does not regularly see doctors and has been trying to put this off to see if it would get better, but symptoms have gotten severe enough that she finally presents for evaluation.  Denies history of coronary disease.  She does have a history of fairly regular alcohol use, and reports a history of peripheral neuropathy related to this.  No leg swelling      Physical Exam   Triage Vital Signs: ED Triage Vitals  Enc Vitals Group     BP 09/07/21 1054 126/85     Pulse Rate 09/07/21 1054 87     Resp 09/07/21 1054 18     Temp 09/07/21 1054 97.9 F (36.6 C)     Temp Source 09/07/21 1054 Oral     SpO2 09/07/21 1054 96 %     Weight 09/07/21 1054 105 lb (47.6 kg)     Height 09/07/21 1054 '5\' 2"'$  (1.575 m)     Head Circumference --      Peak Flow --      Pain Score 09/07/21 1058 10     Pain Loc --      Pain Edu? --      Excl. in [place]? --     Most recent vital signs: Vitals:   09/07/21 1700 09/07/21 1900  BP: 136/74 120/71  Pulse: (!) 56 67  Resp: 15 16  Temp: 97.7 F (36.5 C)   SpO2: 100% 97%     General: Awake, no distress.  Mildly anxious. CV:  Good peripheral perfusion.  Regular rate and rhythm.  No murmurs. Resp:  Normal effort.  Mild  tachypnea, lungs clear bilaterally. Abd:  No distention.  Moderate epigastric discomfort, no rebound or guarding. Other:  Anxious.   ED Results / Procedures / Treatments   Labs (all labs ordered are listed, but only abnormal results are displayed) Labs Reviewed  COMPREHENSIVE METABOLIC PANEL - Abnormal; Notable for the following components:      Result Value   Sodium 132 (*)    Glucose, Bld 124 (*)    Total Protein 8.2 (*)    AST 121 (*)    ALT 74 (*)    All other components within normal limits  URINALYSIS, ROUTINE W REFLEX MICROSCOPIC - Abnormal; Notable for the following components:   Color, Urine COLORLESS (*)    APPearance CLEAR (*)    Specific Gravity, Urine 1.001 (*)    All other components within normal limits  SARS CORONAVIRUS 2 BY RT PCR  CBC WITH DIFFERENTIAL/PLATELET  BRAIN NATRIURETIC PEPTIDE  MAGNESIUM  LIPASE, BLOOD  APTT  PROTIME-INR  HIV ANTIBODY (ROUTINE TESTING W REFLEX)  HEPARIN LEVEL (UNFRACTIONATED)  CBC  PROTIME-INR  APTT  BASIC METABOLIC PANEL  POC URINE PREG, ED  TROPONIN I (HIGH SENSITIVITY)  TROPONIN I (HIGH SENSITIVITY)     EKG Normal sinus rhythm, ventricular rate 86.  PR 180, QRS 83, QTc 438.  ST elevations noted in the inferior leads as well as T wave inversion in 1 and aVL, though the elevations do not quite meet criteria and I suspect are exacerbated by some slight PR depression.   RADIOLOGY CT angio: Significant stenosis noted in the left subclavian, left renal artery, left common iliac, bilateral internal iliac, coronary artery disease Chest x-ray: No acute disease   I also independently reviewed and agree with radiologist interpretations.   PROCEDURES:  Critical Care performed: No  .1-3 Lead EKG Interpretation Performed by: Duffy Bruce, MD Authorized by: Duffy Bruce, MD     Interpretation: normal     ECG rate:  60-70   ECG rate assessment: normal     Rhythm: sinus rhythm     Ectopy: none     Conduction:  normal   Comments:     Indication: Chest pain    MEDICATIONS ORDERED IN ED: Medications  acetaminophen (TYLENOL) tablet 650 mg (has no administration in time range)    Or  acetaminophen (TYLENOL) suppository 650 mg (has no administration in time range)  ondansetron (ZOFRAN) tablet 4 mg (has no administration in time range)    Or  ondansetron (ZOFRAN) injection 4 mg (has no administration in time range)  senna-docusate (Senokot-S) tablet 1 tablet (has no administration in time range)  heparin ADULT infusion 100 units/mL (25000 units/240m) (600 Units/hr Intravenous Handoff 09/07/21 1711)  nicotine (NICODERM CQ - dosed in mg/24 hours) patch 14 mg (has no administration in time range)  nitroGLYCERIN (NITROGLYN) 2 % ointment 1 inch (has no administration in time range)  morphine (PF) 2 MG/ML injection 2-4 mg (has no administration in time range)  LORazepam (ATIVAN) injection 2 mg (has no administration in time range)  sodium chloride flush (NS) 0.9 % injection 3 mL (has no administration in time range)  LORazepam (ATIVAN) injection 1 mg (1 mg Intravenous Given 09/07/21 1322)  iohexol (OMNIPAQUE) 350 MG/ML injection 80 mL (80 mLs Intravenous Contrast Given 09/07/21 1347)  aspirin chewable tablet 324 mg (324 mg Oral Given 09/07/21 1528)  heparin bolus via infusion 2,800 Units (2,800 Units Intravenous Bolus from Bag 09/07/21 1647)     IMPRESSION / [place] / [place]/ [place]RSE  I reviewed the triage vital signs and the nursing notes.                               The patient is on the cardiac monitor to evaluate for evidence of arrhythmia and/or significant heart rate changes.   Ddx:  Differential includes the following, with pertinent life- or limb-threatening emergencies considered:  ACS, PE, dissection, PNA, PTX, pancreatitis, gastritis  Patient's presentation is most consistent with acute presentation with potential threat to life or bodily function.  MDM:  51yo F with h/o  tobacco independence here with CP, SOB. EKG on arrival with inferior ST changes, Q waves concerning for ischemia though pt has no active CP here. Discussed with Dr. KHumphrey Rollsgiven EKG findings, who recommends medical eval/admission and further workup. Given sharp, tearing nature of pain, CT angio also obtained and reviewed. No dissectoin noted, but pt does have significant PVD based on review. Pt also has notable CAD. Will place on heparin per discussion with  Cardiology, plan to admit for likely cath. Pt updated and is in agreement. Labs o/w reassuring. No significant leukocytosis. Lytes unremarkable.   MEDICATIONS GIVEN IN ED: Medications  acetaminophen (TYLENOL) tablet 650 mg (has no administration in time range)    Or  acetaminophen (TYLENOL) suppository 650 mg (has no administration in time range)  ondansetron (ZOFRAN) tablet 4 mg (has no administration in time range)    Or  ondansetron (ZOFRAN) injection 4 mg (has no administration in time range)  senna-docusate (Senokot-S) tablet 1 tablet (has no administration in time range)  heparin ADULT infusion 100 units/mL (25000 units/257m) (600 Units/hr Intravenous Handoff 09/07/21 1711)  nicotine (NICODERM CQ - dosed in mg/24 hours) patch 14 mg (has no administration in time range)  nitroGLYCERIN (NITROGLYN) 2 % ointment 1 inch (has no administration in time range)  morphine (PF) 2 MG/ML injection 2-4 mg (has no administration in time range)  LORazepam (ATIVAN) injection 2 mg (has no administration in time range)  sodium chloride flush (NS) 0.9 % injection 3 mL (has no administration in time range)  LORazepam (ATIVAN) injection 1 mg (1 mg Intravenous Given 09/07/21 1322)  iohexol (OMNIPAQUE) 350 MG/ML injection 80 mL (80 mLs Intravenous Contrast Given 09/07/21 1347)  aspirin chewable tablet 324 mg (324 mg Oral Given 09/07/21 1528)  heparin bolus via infusion 2,800 Units (2,800 Units Intravenous Bolus from Bag 09/07/21 1647)     Consults:   Hospitalist Cardiology, Dr. KHumphrey Rolls  EMR reviewed  No relevant     FINAL CLINICAL IMPRESSION(S) / ED DIAGNOSES   Final diagnoses:  Chest pain with high risk for cardiac etiology  PVD (peripheral vascular disease) ([place]     Rx / DC Orders   ED Discharge Orders     None        Note:  This document was prepared using Dragon voice recognition software and may include unintentional dictation errors.   IDuffy Bruce MD 09/07/21 1919

## 2021-09-07 NOTE — Assessment & Plan Note (Addendum)
-   Inferior Q waves on EKG, no prior EKG for comparison - Epigastric and back pain - Query chronic mesenteric ischemia - CTA of the chest abdomen pelvis was read as no evidence of acute aortic aneurysm or dissection.  No evidence of PE.  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  50 to 80% stenosis with portion of left common iliac and bilateral external iliac and bilateral internal iliac arteries. -EDP consulted cardiology Dr. Humphrey Rolls, who states he will see the patient, recommends heparin GGT for possible cath - Pain management: Nitroglycerin ointment, 1 inch every 6 hours as needed for chest and abdominal pain ordered; morphine 2 to 4 mg IV every 4 hours as needed for moderate and severe pain not controlled with nitroglycerin ointment, 4 doses ordered - AM team to reassess patient at bedside for continued opioid pain requirement - Continue with n.p.o. except for sips with meds

## 2021-09-07 NOTE — ED Notes (Signed)
Pt with c/o SOB and left lower chest pain, EKG done and EDP notified.

## 2021-09-07 NOTE — Discharge Instructions (Addendum)
PCPs in Network with [facility]   -Each of these offices are currently accepting new patients   -to access full list of providers, [email or web address] Scroll down, and click "Find a Doctor"     Charmian Muff, Jeneen Rinks, NP   [place] at [place], [place] [address].6 miles   [phone]      Jeralyn Ruths, NP   [place] at [place]   [place], [place] [address].6 miles   [phone]      Viviana Simpler, MD    [place] at [place], [place] [address].6 miles   [phone]     Denice Paradise, NP   [place] at [place]   [place], [place] [address].6 miles   [phone]      Doristine Mango, [place]   [place], [place] [address].9 miles   [phone]

## 2021-09-07 NOTE — Assessment & Plan Note (Addendum)
-   Presumed secondary to alcohol abuse - Alcohol cessation counseling has been given - Outpatient follow-up with PCP

## 2021-09-07 NOTE — Assessment & Plan Note (Addendum)
-   Extensive alcohol cessation counseling has been given at bedside - Patient states that she has never had a history of delirium tremors, seizures - She states that over the last month she has gone 3 days without drinking before - Her last EtOH drink was 09/06/2021 evening - No indication for CIWA precaution at this time as she is not within the withdrawal window - Ativan 2 mg IV as needed for seizure and anxiety, 3 days ordered with instructions to administer as appropriate and then let provider know

## 2021-09-08 ENCOUNTER — Encounter
Admission: EM | Disposition: A | Discharge status: Home / Self Care | Payer: Self-pay | Source: Home / Self Care | Attending: Internal Medicine

## 2021-09-08 ENCOUNTER — Encounter: Payer: Self-pay | Admitting: Cardiovascular Disease

## 2021-09-08 DIAGNOSIS — I2510 Atherosclerotic heart disease of native coronary artery without angina pectoris: Secondary | ICD-10-CM | POA: Diagnosis not present

## 2021-09-08 DIAGNOSIS — K551 Chronic vascular disorders of intestine: Secondary | ICD-10-CM | POA: Diagnosis present

## 2021-09-08 DIAGNOSIS — I10 Essential (primary) hypertension: Secondary | ICD-10-CM | POA: Diagnosis present

## 2021-09-08 DIAGNOSIS — K2970 Gastritis, unspecified, without bleeding: Secondary | ICD-10-CM | POA: Diagnosis present

## 2021-09-08 DIAGNOSIS — I739 Peripheral vascular disease, unspecified: Secondary | ICD-10-CM

## 2021-09-08 DIAGNOSIS — I25111 Atherosclerotic heart disease of native coronary artery with angina pectoris with documented spasm: Secondary | ICD-10-CM | POA: Diagnosis present

## 2021-09-08 DIAGNOSIS — Z79899 Other long term (current) drug therapy: Secondary | ICD-10-CM | POA: Diagnosis not present

## 2021-09-08 DIAGNOSIS — E162 Hypoglycemia, unspecified: Secondary | ICD-10-CM | POA: Diagnosis not present

## 2021-09-08 DIAGNOSIS — K760 Fatty (change of) liver, not elsewhere classified: Secondary | ICD-10-CM | POA: Diagnosis present

## 2021-09-08 DIAGNOSIS — F419 Anxiety disorder, unspecified: Secondary | ICD-10-CM | POA: Diagnosis present

## 2021-09-08 DIAGNOSIS — Z880 Allergy status to penicillin: Secondary | ICD-10-CM | POA: Diagnosis not present

## 2021-09-08 DIAGNOSIS — I200 Unstable angina: Secondary | ICD-10-CM | POA: Diagnosis not present

## 2021-09-08 DIAGNOSIS — F17200 Nicotine dependence, unspecified, uncomplicated: Secondary | ICD-10-CM | POA: Diagnosis not present

## 2021-09-08 DIAGNOSIS — F17210 Nicotine dependence, cigarettes, uncomplicated: Secondary | ICD-10-CM | POA: Diagnosis present

## 2021-09-08 DIAGNOSIS — R109 Unspecified abdominal pain: Secondary | ICD-10-CM | POA: Diagnosis present

## 2021-09-08 DIAGNOSIS — Z85810 Personal history of malignant neoplasm of tongue: Secondary | ICD-10-CM | POA: Diagnosis not present

## 2021-09-08 DIAGNOSIS — F1010 Alcohol abuse, uncomplicated: Secondary | ICD-10-CM | POA: Diagnosis present

## 2021-09-08 DIAGNOSIS — E871 Hypo-osmolality and hyponatremia: Secondary | ICD-10-CM | POA: Diagnosis present

## 2021-09-08 DIAGNOSIS — G629 Polyneuropathy, unspecified: Secondary | ICD-10-CM | POA: Diagnosis present

## 2021-09-08 DIAGNOSIS — R1084 Generalized abdominal pain: Secondary | ICD-10-CM | POA: Diagnosis not present

## 2021-09-08 DIAGNOSIS — E785 Hyperlipidemia, unspecified: Secondary | ICD-10-CM | POA: Diagnosis present

## 2021-09-08 DIAGNOSIS — E872 Acidosis, unspecified: Secondary | ICD-10-CM

## 2021-09-08 DIAGNOSIS — M79605 Pain in left leg: Secondary | ICD-10-CM | POA: Diagnosis present

## 2021-09-08 DIAGNOSIS — I25110 Atherosclerotic heart disease of native coronary artery with unstable angina pectoris: Secondary | ICD-10-CM | POA: Diagnosis not present

## 2021-09-08 DIAGNOSIS — M79604 Pain in right leg: Secondary | ICD-10-CM | POA: Diagnosis present

## 2021-09-08 DIAGNOSIS — I214 Non-ST elevation (NSTEMI) myocardial infarction: Secondary | ICD-10-CM | POA: Diagnosis not present

## 2021-09-08 DIAGNOSIS — L7632 Postprocedural hematoma of skin and subcutaneous tissue following other procedure: Secondary | ICD-10-CM | POA: Diagnosis not present

## 2021-09-08 DIAGNOSIS — Z20822 Contact with and (suspected) exposure to covid-19: Secondary | ICD-10-CM | POA: Diagnosis present

## 2021-09-08 DIAGNOSIS — I2584 Coronary atherosclerosis due to calcified coronary lesion: Secondary | ICD-10-CM | POA: Diagnosis present

## 2021-09-08 DIAGNOSIS — R710 Precipitous drop in hematocrit: Secondary | ICD-10-CM | POA: Diagnosis not present

## 2021-09-08 HISTORY — PX: CORONARY BALLOON ANGIOPLASTY: CATH118233

## 2021-09-08 HISTORY — PX: LEFT HEART CATH AND CORONARY ANGIOGRAPHY: CATH118249

## 2021-09-08 LAB — BASIC METABOLIC PANEL
Anion gap: 16 — ABNORMAL HIGH (ref 5–15)
BUN: 11 mg/dL (ref 6–20)
CO2: 18 mmol/L — ABNORMAL LOW (ref 22–32)
Calcium: 9.5 mg/dL (ref 8.9–10.3)
Chloride: 102 mmol/L (ref 98–111)
Creatinine, Ser: 0.76 mg/dL (ref 0.44–1.00)
GFR, Estimated: >60 mL/min (ref >60)
Glucose, Bld: 51 mg/dL — ABNORMAL LOW (ref 70–99)
Potassium: 4.1 mmol/L (ref 3.5–5.1)
Sodium: 136 mmol/L (ref 135–145)

## 2021-09-08 LAB — CBC
HCT: 41.7 % (ref 36.0–46.0)
Hemoglobin: 13.9 g/dL (ref 12.0–15.0)
MCH: 33.1 pg (ref 26.0–34.0)
MCHC: 33.3 g/dL (ref 30.0–36.0)
MCV: 99.3 fL (ref 80.0–100.0)
Platelets: 334 10*3/uL (ref 150–400)
RBC: 4.2 MIL/uL (ref 3.87–5.11)
RDW: 13.5 % (ref 11.5–15.5)
WBC: 9.7 10*3/uL (ref 4.0–10.5)
nRBC: 0 % (ref 0.0–0.2)

## 2021-09-08 LAB — HEPARIN LEVEL (UNFRACTIONATED)
Heparin Unfractionated: 0.16 IU/mL — ABNORMAL LOW (ref 0.30–0.70)
Heparin Unfractionated: 0.23 IU/mL — ABNORMAL LOW (ref 0.30–0.70)

## 2021-09-08 LAB — APTT: aPTT: 52 seconds — ABNORMAL HIGH (ref 24–36)

## 2021-09-08 LAB — PROTIME-INR
INR: 1 (ref 0.8–1.2)
Prothrombin Time: 13.3 seconds (ref 11.4–15.2)

## 2021-09-08 LAB — HIV ANTIBODY (ROUTINE TESTING W REFLEX): HIV Screen 4th Generation wRfx: NONREACTIVE

## 2021-09-08 LAB — TSH: TSH: 4.241 u[IU]/mL (ref 0.350–4.500)

## 2021-09-08 SURGERY — LEFT HEART CATH AND CORONARY ANGIOGRAPHY
Anesthesia: Moderate Sedation

## 2021-09-08 MED ORDER — SODIUM CHLORIDE 0.9 % WEIGHT BASED INFUSION: 1.0000 mL/kg/h | INTRAVENOUS | Status: AC

## 2021-09-08 MED ORDER — ATORVASTATIN CALCIUM 80 MG PO TABS
80.0000 mg | ORAL_TABLET | Freq: Every day | ORAL | Status: DC
  Administered 2021-09-09 – 2021-09-11 (×3): 80 mg via ORAL
  Filled 2021-09-08 (×5): qty 1

## 2021-09-08 MED ORDER — LABETALOL HCL 5 MG/ML IV SOLN: 10.0000 mg | INTRAVENOUS | Status: AC | PRN

## 2021-09-08 MED ORDER — SODIUM CHLORIDE 0.9 % IV SOLN: 250.0000 mL | INTRAVENOUS | Status: DC | PRN

## 2021-09-08 MED ORDER — SODIUM BICARBONATE 650 MG PO TABS
650.0000 mg | ORAL_TABLET | Freq: Three times a day (TID) | ORAL | Status: DC
  Administered 2021-09-08 – 2021-09-11 (×8): 650 mg via ORAL
  Filled 2021-09-08 (×8): qty 1

## 2021-09-08 MED ORDER — IOHEXOL 300 MG/ML  SOLN
INTRAMUSCULAR | Status: DC | PRN
  Administered 2021-09-08: 4 mL

## 2021-09-08 MED ORDER — MORPHINE SULFATE (PF) 2 MG/ML IV SOLN
INTRAVENOUS | Status: AC
  Administered 2021-09-08: 4 mg via INTRAVENOUS
  Filled 2021-09-08: qty 2

## 2021-09-08 MED ORDER — ASPIRIN 81 MG PO CHEW
81.0000 mg | CHEWABLE_TABLET | Freq: Every day | ORAL | Status: DC
  Administered 2021-09-10 – 2021-09-11 (×2): 81 mg via ORAL
  Filled 2021-09-08 (×4): qty 1

## 2021-09-08 MED ORDER — FENTANYL CITRATE (PF) 100 MCG/2ML IJ SOLN
INTRAMUSCULAR | Status: AC
  Filled 2021-09-08: qty 2

## 2021-09-08 MED ORDER — PANTOPRAZOLE SODIUM 40 MG PO TBEC
40.0000 mg | DELAYED_RELEASE_TABLET | Freq: Every day | ORAL | Status: DC
  Administered 2021-09-08 – 2021-09-11 (×4): 40 mg via ORAL
  Filled 2021-09-08 (×4): qty 1

## 2021-09-08 MED ORDER — HEPARIN (PORCINE) IN NACL 1000-0.9 UT/500ML-% IV SOLN
INTRAVENOUS | Status: AC
  Filled 2021-09-08: qty 1000

## 2021-09-08 MED ORDER — FENTANYL CITRATE (PF) 100 MCG/2ML IJ SOLN
25.0000 ug | Freq: Once | INTRAMUSCULAR | Status: AC | PRN
  Administered 2021-09-08: 25 ug via INTRAVENOUS

## 2021-09-08 MED ORDER — HYDRALAZINE HCL 20 MG/ML IJ SOLN
10.0000 mg | INTRAMUSCULAR | Status: AC | PRN
  Administered 2021-09-08: 10 mg via INTRAVENOUS
  Filled 2021-09-08: qty 1

## 2021-09-08 MED ORDER — ASPIRIN 81 MG PO CHEW
81.0000 mg | CHEWABLE_TABLET | ORAL | Status: AC
  Administered 2021-09-08 – 2021-09-09 (×2): 81 mg via ORAL

## 2021-09-08 MED ORDER — SODIUM CHLORIDE 0.9 % IV SOLN: INTRAVENOUS | Status: AC

## 2021-09-08 MED ORDER — SODIUM CHLORIDE 0.9 % WEIGHT BASED INFUSION
3.0000 mL/kg/h | INTRAVENOUS | Status: DC
  Administered 2021-09-08: 3 mL/kg/h via INTRAVENOUS

## 2021-09-08 MED ORDER — MIDAZOLAM HCL 2 MG/2ML IJ SOLN
INTRAMUSCULAR | Status: DC | PRN
  Administered 2021-09-08: 1 mg via INTRAVENOUS

## 2021-09-08 MED ORDER — IOHEXOL 300 MG/ML  SOLN
INTRAMUSCULAR | Status: DC | PRN
  Administered 2021-09-08: 63 mL

## 2021-09-08 MED ORDER — MIDAZOLAM HCL 2 MG/2ML IJ SOLN
INTRAMUSCULAR | Status: AC
  Filled 2021-09-08: qty 2

## 2021-09-08 MED ORDER — SODIUM CHLORIDE 0.9% FLUSH: 3.0000 mL | INTRAVENOUS | Status: DC | PRN

## 2021-09-08 MED ORDER — LIDOCAINE HCL (PF) 1 % IJ SOLN
INTRAMUSCULAR | Status: DC | PRN
  Administered 2021-09-08: 20 mL

## 2021-09-08 MED ORDER — LIDOCAINE HCL 1 % IJ SOLN
INTRAMUSCULAR | Status: AC
  Filled 2021-09-08: qty 20

## 2021-09-08 MED ORDER — SODIUM CHLORIDE 0.9% FLUSH
3.0000 mL | Freq: Two times a day (BID) | INTRAVENOUS | Status: DC
  Administered 2021-09-09 – 2021-09-10 (×3): 3 mL via INTRAVENOUS

## 2021-09-08 MED ORDER — METOPROLOL TARTRATE 25 MG PO TABS
12.5000 mg | ORAL_TABLET | Freq: Two times a day (BID) | ORAL | Status: DC
  Administered 2021-09-08 – 2021-09-11 (×6): 12.5 mg via ORAL
  Filled 2021-09-08 (×9): qty 1

## 2021-09-08 MED ORDER — SODIUM CHLORIDE 0.9% FLUSH
3.0000 mL | Freq: Two times a day (BID) | INTRAVENOUS | Status: DC
  Administered 2021-09-08 – 2021-09-10 (×4): 3 mL via INTRAVENOUS

## 2021-09-08 MED ORDER — SODIUM CHLORIDE 0.9 % WEIGHT BASED INFUSION
1.0000 mL/kg/h | INTRAVENOUS | Status: DC
Start: 2021-09-09 — End: 2021-09-08
  Administered 2021-09-08: 1 mL/kg/h via INTRAVENOUS

## 2021-09-08 MED ORDER — FENTANYL CITRATE (PF) 100 MCG/2ML IJ SOLN
INTRAMUSCULAR | Status: DC | PRN
Start: 2021-09-08 — End: 2021-09-08
  Administered 2021-09-08: 50 ug via INTRAVENOUS

## 2021-09-08 MED ORDER — ONDANSETRON HCL 4 MG/2ML IJ SOLN: 4.0000 mg | Freq: Four times a day (QID) | INTRAMUSCULAR | Status: DC | PRN

## 2021-09-08 MED ORDER — HEPARIN BOLUS VIA INFUSION
1400.0000 [IU] | Freq: Once | INTRAVENOUS | Status: AC
Start: 2021-09-08 — End: 2021-09-08
  Administered 2021-09-08: 1400 [IU] via INTRAVENOUS
  Filled 2021-09-08: qty 1400

## 2021-09-08 MED ORDER — HEPARIN SODIUM (PORCINE) 1000 UNIT/ML IJ SOLN
INTRAMUSCULAR | Status: AC
  Filled 2021-09-08: qty 10

## 2021-09-08 MED ORDER — FENTANYL CITRATE (PF) 100 MCG/2ML IJ SOLN
INTRAMUSCULAR | Status: DC | PRN
  Administered 2021-09-08: 50 ug via INTRAVENOUS

## 2021-09-08 MED ORDER — TICAGRELOR 90 MG PO TABS
ORAL_TABLET | ORAL | Status: AC
  Filled 2021-09-08: qty 2

## 2021-09-08 MED ORDER — ACETAMINOPHEN 325 MG PO TABS
650.0000 mg | ORAL_TABLET | ORAL | Status: DC | PRN
  Administered 2021-09-08 – 2021-09-10 (×2): 650 mg via ORAL
  Filled 2021-09-08: qty 2

## 2021-09-08 MED ORDER — SODIUM CHLORIDE 0.9% FLUSH
3.0000 mL | INTRAVENOUS | Status: DC | PRN
  Administered 2021-09-08: 3 mL via INTRAVENOUS

## 2021-09-08 MED ORDER — HEPARIN BOLUS VIA INFUSION
700.0000 [IU] | Freq: Once | INTRAVENOUS | Status: DC
  Filled 2021-09-08: qty 700

## 2021-09-08 MED ORDER — ASPIRIN 81 MG PO CHEW
CHEWABLE_TABLET | ORAL | Status: AC
  Filled 2021-09-08: qty 1

## 2021-09-08 MED ORDER — SODIUM CHLORIDE 0.9 % IV SOLN
250.0000 mL | INTRAVENOUS | Status: DC | PRN
Start: 2021-09-08 — End: 2021-09-11

## 2021-09-08 SURGICAL SUPPLY — 15 items
CATH INFINITI 5FR MULTPACK ANG (CATHETERS) ×1 IMPLANT
DEVICE CLOSURE MYNXGRIP 6/7F (Vascular Products) ×1 IMPLANT
DEVICE SAFEGUARD 24CM (GAUZE/BANDAGES/DRESSINGS) ×2 IMPLANT
KIT ENCORE 26 ADVANTAGE (KITS) IMPLANT
NDL PERC 18GX7CM (NEEDLE) IMPLANT
NEEDLE PERC 18GX7CM (NEEDLE) ×2 IMPLANT
PACK CARDIAC CATH (CUSTOM PROCEDURE TRAY) ×2 IMPLANT
PROTECTION STATION PRESSURIZED (MISCELLANEOUS) ×2
SET ATX SIMPLICITY (MISCELLANEOUS) ×1 IMPLANT
SHEATH AVANTI 5FR X 11CM (SHEATH) ×1 IMPLANT
SHEATH AVANTI 6FR X 11CM (SHEATH) ×1 IMPLANT
STATION PROTECTION PRESSURIZED (MISCELLANEOUS) IMPLANT
TUBING CIL FLEX 10 FLL-RA (TUBING) IMPLANT
WIRE GUIDERIGHT .035X150 (WIRE) ×1 IMPLANT
WIRE RUNTHROUGH .014X180CM (WIRE) IMPLANT

## 2021-09-08 NOTE — Progress Notes (Signed)
I was asked to perform right coronary artery PCI by Dr. Humphrey Rolls earlier today.  The patient has peripheral arterial disease.  Arterial access was already obtained via the right common femoral artery.  The sheath was exchanged into a 6 French sheath for PCI.  Angiography via the sheath showed significant right external iliac artery stenosis and the sheath was occlusive.  I felt it was not safe to proceed with PCI due to increased risk of right leg thrombosis and ischemia.  Thus, the sheath was removed and hemostasis was achieved with a Mynx grip closure device done under fluoroscopy.  A small hematoma was noted and thus manual pressure was held.  I felt that it was not safe to proceed with right coronary artery PCI via the right radial artery at the same time. The patient had recurrent right groin hematoma in the holding area and additional pressure was held.  Vital signs remained stable and distal pulses were palpable.  The plan is to proceed with right coronary artery PCI tomorrow via the right radial artery by Dr. Saunders Revel as long as the right groin hematoma is stable and hemoglobin remained stable.  We will check CBC in a.m.  I discussed the PCI procedure in details with the patient as well as risk and benefits. The patient does have significant bilateral leg claudication.  CTA already showed significant iliac disease bilaterally.  This can be managed in the outpatient setting.

## 2021-09-08 NOTE — Progress Notes (Signed)
She has a high-grade lesion in the mid RCA requiring PCI and stenting and will be kept in the hospital till tomorrow.  Patient with was added Brilinta 90 twice daily on top of aspirin.  Advised cessation of smoking.

## 2021-09-08 NOTE — Progress Notes (Signed)
  Progress Note   Patient: Sonya Harmon ZMO:[id number] DOB: [birth year] DOA: 09/07/2021     0 DOS: the patient was seen and examined on 09/08/2021   Brief hospital course: Sonya Harmon is a 51 year old female with history of poor healthcare maintenance, has not seen a doctor in a long time, heavy tobacco use, heavy tobacco dependence, who presents emergency department for chief concerns of generalized abdominal pain for several months. CT scan of the chest/abdomen/pelvis with contrast showed 50% stenosis in superior mesenteric arteries.  Peripheral vascular disease.     Assessment and Plan: Abdominal pain. Patient is complaining of left upper abdominal pain for the last 3 months.  She also has significant anxiety.  The pain happens on a daily basis, with each episode lasted about 15 minutes.  She was also nauseated, but no vomiting.  Examination showed tenderness in left upper quadrant, most likely due to gastritis versus peptic ulcer disease. I will start Protonix. Another consideration is due to angina from right coronary disease.  Patient had heart cath today, showed RCA stenosis.  Planning for intervention tomorrow.  Coronary disease. Patient has RCA stenosis, planning for heart cath tomorrow.  Anxiety. Check a TSH to rule out hyperthyroidism.  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  This does not most likely not the source of abdominal pain.  Patient can follow-up with vascular surgery.  Peripheral arterial disease. Follow-up with vascular surgery as outpatient.  Hyponatremia. Metabolic acidosis. Start sodium bicarb and orally.  Check level tomorrow.  Liver function changes. Given patient nausea, will check hepatitis panel.     Subjective:  Patient currently doing well, denies any abdominal pain or chest pain.  No shortness of breath.  No nausea.  Physical Exam: Vitals:   09/08/21 1125 09/08/21 1130 09/08/21  1200 09/08/21 1230  BP: (!) 132/106 130/86 (!) 143/76 (!) 155/78  Pulse: (!) 101 63 (!) 57 (!) 50  Resp: (!) '25 14 16 16  '$ Temp:      TempSrc:      SpO2: 100% 99% 98% 99%  Weight:      Height:       General exam: Appears calm and comfortable  Respiratory system: Clear to auscultation. Respiratory effort normal. Cardiovascular system: S1 & S2 heard, RRR. No JVD, murmurs, rubs, gallops or clicks. No pedal edema. Gastrointestinal system: Abdomen is nondistended, soft and LUQ tender. No organomegaly or masses felt. Normal bowel sounds heard. Central nervous system: Alert and oriented. No focal neurological deficits. Extremities: Symmetric 5 x 5 power. Skin: No rashes, lesions or ulcers Psychiatry: Judgement and insight appear normal. Mood & affect appropriate.   Data Reviewed:  Reviewed the CT angiogram of the chest/abdomen/pelvis.  Reviewed all lab results.  Family Communication:   Disposition: Status is: Inpatient Remains inpatient appropriate because: Severity of disease, pending inpatient procedure  Planned Discharge Destination: Home    Time spent: 35 minutes  Author: Sharen Hones, MD 09/08/2021 1:32 PM  For on call review [email or web address].

## 2021-09-08 NOTE — Progress Notes (Signed)
Unable to do PCI of RCA today due to severe PVD, needing to reschedual tomorrow via radial approach.

## 2021-09-08 NOTE — Consult Note (Signed)
ANTICOAGULATION CONSULT NOTE   Pharmacy Consult for heparin Indication: chest pain/ACS  Allergies  Allergen Reactions   Amoxicillin Rash    Patient Measurements: Height: '5\' 2"'$  (157.5 cm) Weight: 47.6 kg (104 lb 15 oz) IBW/kg (Calculated) : 50.1 Heparin Dosing Weight: 47.6 kg  Vital Signs: Temp: 97.7 F (36.5 C) (06/04 1700) Temp Source: Oral (06/04 1700) BP: 127/72 (06/04 2100) Pulse Rate: 62 (06/04 2100)  Labs: Recent Labs    09/07/21 1125 09/07/21 1308 09/07/21 1600 09/08/21 0057  HGB 14.4  --   --   --   HCT 41.2  --   --   --   PLT 338  --   --   --   APTT  --   --  31  --   LABPROT  --   --  13.1  --   INR  --   --  1.0  --   HEPARINUNFRC  --   --   --  0.16*  CREATININE 0.60  --   --   --   TROPONINIHS 6 7  --   --      Estimated Creatinine Clearance: 64.6 mL/min (by C-G formula based on SCr of 0.6 mg/dL).   Medical History: Past Medical History:  Diagnosis Date   Alcohol abuse    History of tongue cancer    Tobacco abuse     Medications:  No PTA anticoagulation or antiplatelet therapy   Assessment: 51 y.o. female presenting to the ED with complaints of constant but progressively worsening epigastric and lower chest pain over the last several months. Pharmacy has been consulted for heparin dosing for ACS.   Baseline labs: Hgb: 14.4, plts 338, aPTT 31, INR 1.0  Goal of Therapy:  Heparin level 0.3-0.7 units/ml Monitor platelets by anticoagulation protocol: Yes  6/05 0057 HL 0.16, subtherapeutic   Plan:  Bolus 1400 units x 1 Increase heparin infusion to 750 units/hr Recheck HL in 6 hr after rate change. CBC daily while on heparin.  Renda Rolls, PharmD, [facility] 09/08/2021 1:34 AM

## 2021-09-08 NOTE — Progress Notes (Signed)
SUBJECTIVE: Has intermittent chest pain.   Vitals:   09/08/21 0400 09/08/21 0430 09/08/21 0600 09/08/21 0630  BP: 105/64 98/66 109/68 (!) 104/57  Pulse: 65 77 72 71  Resp: '14 18 15 20  '$ Temp:      TempSrc:      SpO2: 98% 96% 94% 98%  Weight:      Height:       No intake or output data in the 24 hours ending 09/08/21 0742  LABS: Basic Metabolic Panel: Recent Labs    09/07/21 1125  NA 132*  K 3.6  CL 100  CO2 22  GLUCOSE 124*  BUN 6  CREATININE 0.60  CALCIUM 9.1  MG 2.1   Liver Function Tests: Recent Labs    09/07/21 1125  AST 121*  ALT 74*  ALKPHOS 76  BILITOT 1.0  PROT 8.2*  ALBUMIN 4.7   Recent Labs    09/07/21 1125  LIPASE 33   CBC: Recent Labs    09/07/21 1125 09/08/21 0722  WBC 8.9 9.7  NEUTROABS 6.3  --   HGB 14.4 13.9  HCT 41.2 41.7  MCV 94.9 99.3  PLT 338 334   Cardiac Enzymes: No results for input(s): CKTOTAL, CKMB, CKMBINDEX, TROPONINI in the last 72 hours. BNP: Invalid input(s): POCBNP D-Dimer: No results for input(s): DDIMER in the last 72 hours. Hemoglobin A1C: No results for input(s): HGBA1C in the last 72 hours. Fasting Lipid Panel: No results for input(s): CHOL, HDL, LDLCALC, TRIG, CHOLHDL, LDLDIRECT in the last 72 hours. Thyroid Function Tests: No results for input(s): TSH, T4TOTAL, T3FREE, THYROIDAB in the last 72 hours.  Invalid input(s): FREET3 Anemia Panel: No results for input(s): VITAMINB12, FOLATE, FERRITIN, TIBC, IRON, RETICCTPCT in the last 72 hours.   PHYSICAL EXAM General: Well developed, well nourished, in no acute distress HEENT:  Normocephalic and atramatic Neck:  No JVD.  Lungs: Clear bilaterally to auscultation and percussion. Heart: HRRR . Normal S1 and S2 without gallops or murmurs.  Abdomen: Bowel sounds are positive, abdomen soft and non-tender  Msk:  Back normal, normal gait. Normal strength and tone for age. Extremities: No clubbing, cyanosis or edema.   Neuro: Alert and oriented X 3. Psych:   Good affect, responds appropriately  TELEMETRY: Sinus rhythm  ASSESSMENT AND PLAN: Unstable angina with minimal ST elevation in the inferior and lateral leads with Q waves.  Will do left heart catheterization today and if is negative patient can be discharged with follow-up in the office 1 week from now.  May be next Monday at 9 AM.  Principal Problem:   Abdominal pain Active Problems:   Tobacco dependence   Leg pain, bilateral   Alcohol abuse   Hepatic steatosis   Does not have primary care provider    Dionisio David, MD, [facility] 09/08/2021 7:42 AM

## 2021-09-08 NOTE — Consult Note (Signed)
ANTICOAGULATION CONSULT NOTE   Pharmacy Consult for heparin Indication: chest pain/ACS  Allergies  Allergen Reactions   Amoxicillin Rash    Patient Measurements: Height: '5\' 2"'$  (157.5 cm) Weight: 47.6 kg (104 lb 15 oz) IBW/kg (Calculated) : 50.1 Heparin Dosing Weight: 47.6 kg  Vital Signs: Temp: 97.7 F (36.5 C) (06/05 0823) Temp Source: Oral (06/05 0823) BP: 162/89 (06/05 0823) Pulse Rate: 70 (06/05 0823)  Labs: Recent Labs    09/07/21 1125 09/07/21 1308 09/07/21 1600 09/08/21 0057 09/08/21 0722  HGB 14.4  --   --   --  13.9  HCT 41.2  --   --   --  41.7  PLT 338  --   --   --  334  APTT  --   --  31  --  52*  LABPROT  --   --  13.1  --  13.3  INR  --   --  1.0  --  1.0  HEPARINUNFRC  --   --   --  0.16* 0.23*  CREATININE 0.60  --   --   --  0.76  TROPONINIHS 6 7  --   --   --      Estimated Creatinine Clearance: 64.6 mL/min (by C-G formula based on SCr of 0.76 mg/dL).   Medical History: Past Medical History:  Diagnosis Date   Alcohol abuse    History of tongue cancer    Tobacco abuse     Medications:  No PTA anticoagulation or antiplatelet therapy   Assessment: 51 y.o. female presenting to the ED with complaints of constant but progressively worsening epigastric and lower chest pain over the last several months. Pharmacy has been consulted for heparin dosing for ACS.   Baseline labs: Hgb: 14.4, plts 338, aPTT 31, INR 1.0  Goal of Therapy:  Heparin level 0.3-0.7 units/ml Monitor platelets by anticoagulation protocol: Yes  6/05 0057 HL 0.16, subtherapeutic 6/5   0722 HL 0.23, subthera, increase rate from 750 to 850 units/hr   Plan:  *patient in Cath lab currently 6/5'@1000'$ * (Will consider Bolus 700 units x 1 and Increase heparin infusion to 850 units/hr if it is to be continued) Recheck HL in 6 hr after rate change. CBC daily while on heparin.  Chinita Greenland PharmD Clinical Pharmacist 09/08/2021

## 2021-09-08 NOTE — Plan of Care (Signed)
  Problem: Activity: Goal: Ability to return to baseline activity level will improve Outcome: Progressing   Problem: Cardiovascular: Goal: Ability to achieve and maintain adequate cardiovascular perfusion will improve Outcome: Progressing Goal: Vascular access site(s) Level 0-1 will be maintained Outcome: Progressing   

## 2021-09-09 ENCOUNTER — Encounter: Payer: Self-pay | Admitting: Cardiovascular Disease

## 2021-09-09 DIAGNOSIS — K760 Fatty (change of) liver, not elsewhere classified: Secondary | ICD-10-CM | POA: Diagnosis not present

## 2021-09-09 DIAGNOSIS — I739 Peripheral vascular disease, unspecified: Secondary | ICD-10-CM

## 2021-09-09 DIAGNOSIS — R1084 Generalized abdominal pain: Secondary | ICD-10-CM | POA: Diagnosis not present

## 2021-09-09 DIAGNOSIS — E871 Hypo-osmolality and hyponatremia: Secondary | ICD-10-CM

## 2021-09-09 DIAGNOSIS — I214 Non-ST elevation (NSTEMI) myocardial infarction: Secondary | ICD-10-CM | POA: Diagnosis not present

## 2021-09-09 DIAGNOSIS — E162 Hypoglycemia, unspecified: Secondary | ICD-10-CM

## 2021-09-09 DIAGNOSIS — I25111 Atherosclerotic heart disease of native coronary artery with angina pectoris with documented spasm: Secondary | ICD-10-CM | POA: Diagnosis not present

## 2021-09-09 LAB — CBC
HCT: 31.4 % — ABNORMAL LOW (ref 36.0–46.0)
HCT: 30.7 % — ABNORMAL LOW (ref 36.0–46.0)
Hemoglobin: 10.7 g/dL — ABNORMAL LOW (ref 12.0–15.0)
Hemoglobin: 10.7 g/dL — ABNORMAL LOW (ref 12.0–15.0)
MCH: 33.5 pg (ref 26.0–34.0)
MCH: 34 pg (ref 26.0–34.0)
MCHC: 34.1 g/dL (ref 30.0–36.0)
MCHC: 34.9 g/dL (ref 30.0–36.0)
MCV: 98.4 fL (ref 80.0–100.0)
MCV: 97.5 fL (ref 80.0–100.0)
Platelets: 276 10*3/uL (ref 150–400)
Platelets: 259 10*3/uL (ref 150–400)
RBC: 3.19 MIL/uL — ABNORMAL LOW (ref 3.87–5.11)
RBC: 3.15 MIL/uL — ABNORMAL LOW (ref 3.87–5.11)
RDW: 13.2 % (ref 11.5–15.5)
RDW: 13.2 % (ref 11.5–15.5)
WBC: 8.2 10*3/uL (ref 4.0–10.5)
WBC: 7.9 10*3/uL (ref 4.0–10.5)
nRBC: 0 % (ref 0.0–0.2)
nRBC: 0 % (ref 0.0–0.2)

## 2021-09-09 LAB — BASIC METABOLIC PANEL
Anion gap: 9 (ref 5–15)
BUN: 11 mg/dL (ref 6–20)
CO2: 22 mmol/L (ref 22–32)
Calcium: 9.1 mg/dL (ref 8.9–10.3)
Chloride: 100 mmol/L (ref 98–111)
Creatinine, Ser: 0.78 mg/dL (ref 0.44–1.00)
GFR, Estimated: >60 mL/min (ref >60)
Glucose, Bld: 69 mg/dL — ABNORMAL LOW (ref 70–99)
Potassium: 4.1 mmol/L (ref 3.5–5.1)
Sodium: 131 mmol/L — ABNORMAL LOW (ref 135–145)

## 2021-09-09 LAB — HEPATITIS PANEL, ACUTE
HCV Ab: NONREACTIVE
Hep A IgM: NONREACTIVE
Hep B C IgM: NONREACTIVE
Hepatitis B Surface Ag: NONREACTIVE

## 2021-09-09 LAB — LIPID PANEL
Cholesterol: 177 mg/dL (ref 0–200)
HDL: 95 mg/dL (ref >40)
LDL Cholesterol: 76 mg/dL (ref 0–99)
Total CHOL/HDL Ratio: 1.9 RATIO
Triglycerides: 30 mg/dL (ref <150)
VLDL: 6 mg/dL (ref 0–40)

## 2021-09-09 LAB — MAGNESIUM: Magnesium: 2.1 mg/dL (ref 1.7–2.4)

## 2021-09-09 MED ORDER — CLOPIDOGREL BISULFATE 75 MG PO TABS
75.0000 mg | ORAL_TABLET | Freq: Every day | ORAL | Status: DC
  Administered 2021-09-10 – 2021-09-11 (×2): 75 mg via ORAL
  Filled 2021-09-09 (×2): qty 1

## 2021-09-09 MED ORDER — CLOPIDOGREL BISULFATE 75 MG PO TABS
600.0000 mg | ORAL_TABLET | Freq: Once | ORAL | Status: AC
  Administered 2021-09-09: 600 mg via ORAL
  Filled 2021-09-09: qty 8

## 2021-09-09 NOTE — Progress Notes (Signed)
  Progress Note   Patient: Sonya Harmon BSJ:[id number] DOB: 1970/12/12 DOA: 09/07/2021     1 DOS: the patient was seen and examined on 09/09/2021   Brief hospital course: Sonya Harmon is a 51 year old female with history of poor healthcare maintenance, has not seen a doctor in a long time, heavy tobacco use, heavy tobacco dependence, who presents emergency department for chief concerns of generalized abdominal pain for several months. CT scan of the chest/abdomen/pelvis with contrast showed 50% stenosis in superior mesenteric arteries.  Peripheral vascular disease. Heart catheter was performed by cardiology on 6/5, showed RCA stenosis, intervention scheduled on 6/7     Assessment and Plan: Abdominal pain. Chest pain appears to be secondary to gastritis.  Another possibility is due to angina from RCA.  Continue PPI for now.  Abdominal twinge better today.  Coronary artery disease. Patient has RCA stenosis, planning for heart cath 6/7.   Anxiety. Normal TSH.  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  This does not most likely not the source of abdominal pain.  Patient can follow-up with vascular surgery as outpatient.  Peripheral arterial disease. Outpatient follow-up with vascular surgery.   Hyponatremia. Metabolic acidosis. Hypoglycemia. Continue sodium bicarbonate tablets for another day.  May discontinue tomorrow. Patient also developed hypoglycemia, she is not diabetic.  We will check cortisol level to rule out adrenal insufficiency.  Liver function changes. Check acute hepatitis panel.        Subjective:  Patient doing much better, no abdominal pain today.  No nausea vomiting.  Physical Exam: Vitals:   09/09/21 0003 09/09/21 0459 09/09/21 0759 09/09/21 1202  BP: 122/75 110/79 102/63 102/63  Pulse: 73 70 61 65  Resp: '19 20 20 18  '$ Temp: 98.3 F (36.8 C) 98.2 F (36.8 C) 97.9 F (36.6 C) 98.1 F (36.7 C)   TempSrc: Oral Oral Oral   SpO2: 100% 99% 100% 100%  Weight:      Height:       General exam: Appears calm and comfortable  Respiratory system: Clear to auscultation. Respiratory effort normal. Cardiovascular system: S1 & S2 heard, RRR. No JVD, murmurs, rubs, gallops or clicks. No pedal edema. Gastrointestinal system: Abdomen is nondistended, soft and nontender. No organomegaly or masses felt. Normal bowel sounds heard. Central nervous system: Alert and oriented. No focal neurological deficits. Extremities: Symmetric 5 x 5 power. Skin: No rashes, lesions or ulcers Psychiatry: Judgement and insight appear normal. Mood & affect appropriate.   Data Reviewed:  Lab results reviewed  Family Communication:   Disposition: Status is: Inpatient Remains inpatient appropriate because: Severity of disease, pending procedure.  Planned Discharge Destination: Home    Time spent: 30 minutes  Author: Sharen Hones, MD 09/09/2021 1:36 PM  For on call review [email or web address].

## 2021-09-09 NOTE — Progress Notes (Signed)
Interventional Cardiology Progress Note  Patient Name: YANIAH THIEMANN Date of Encounter: 09/09/2021  [facility] Cardiologist: Humphrey Rolls  Subjective   No CP or shortness of breath.  Right groin pain has resolved, though Safeguard is still in place.  Inpatient Medications    Scheduled Meds:  aspirin  81 mg Oral Daily   atorvastatin  80 mg Oral Daily   metoprolol tartrate  12.5 mg Oral BID   pantoprazole  40 mg Oral Daily   sodium bicarbonate  650 mg Oral TID   sodium chloride flush  3 mL Intravenous Q12H   sodium chloride flush  3 mL Intravenous Q12H   sodium chloride flush  3 mL Intravenous Q12H   Continuous Infusions:  sodium chloride     sodium chloride     PRN Meds: sodium chloride, sodium chloride, acetaminophen **OR** acetaminophen, acetaminophen, LORazepam, morphine injection, nicotine, nitroGLYCERIN, ondansetron **OR** ondansetron (ZOFRAN) IV, ondansetron (ZOFRAN) IV, senna-docusate, sodium chloride flush, sodium chloride flush   Vital Signs    Vitals:   09/08/21 1956 09/09/21 0003 09/09/21 0459 09/09/21 0759  BP: 102/70 122/75 110/79 102/63  Pulse: 93 73 70 61  Resp: '20 19 20 20  '$ Temp: 98.3 F (36.8 C) 98.3 F (36.8 C) 98.2 F (36.8 C) 97.9 F (36.6 C)  TempSrc: Oral Oral Oral Oral  SpO2: 100% 100% 99% 100%  Weight:      Height:        Intake/Output Summary (Last 24 hours) at 09/09/2021 0810 Last data filed at 09/09/2021 0500 Gross per 24 hour  Intake 600 ml  Output 351 ml  Net 249 ml      09/07/2021   10:59 AM 09/07/2021   10:54 AM  Last 3 Weights  Weight (lbs) 104 lb 15 oz 105 lb  Weight (kg) 47.6 kg 47.628 kg      Telemetry    NSR and sinus tachycardia - Personally Reviewed  ECG    No new tracing.  Physical Exam   GEN: No acute distress.   Neck: No JVD Cardiac: RRR, no murmurs, rubs, or gallops.  Respiratory: Mildly diminished breath sounds throughout.  Right groin with Safeguard in place.  After removal of Safeguard, minimal bruising  noted.  No hematoma palpable.  Right femoral pulse is 1+ without bruit. GI: Soft, nontender, non-distended  MS: No edema; No deformity. Neuro:  Nonfocal  Psych: Normal affect   Labs    High Sensitivity Troponin:   Recent Labs  Lab 09/07/21 1125 09/07/21 1308  TROPONINIHS 6 7     Chemistry Recent Labs  Lab 09/07/21 1125 09/08/21 0722 09/09/21 0541  NA 132* 136 131*  K 3.6 4.1 4.1  CL 100 102 100  CO2 22 18* 22  GLUCOSE 124* 51* 69*  BUN '6 11 11  '$ CREATININE 0.60 0.76 0.78  CALCIUM 9.1 9.5 9.1  MG 2.1  --  2.1  PROT 8.2*  --   --   ALBUMIN 4.7  --   --   AST 121*  --   --   ALT 74*  --   --   ALKPHOS 76  --   --   BILITOT 1.0  --   --   GFRNONAA >60 >60 >60  ANIONGAP 10 16* 9    Lipids  Recent Labs  Lab 09/09/21 0541  CHOL 177  TRIG 30  HDL 95  LDLCALC 76  CHOLHDL 1.9    Hematology Recent Labs  Lab 09/07/21 1125 09/08/21 0722 09/09/21 0541  WBC 8.9 9.7 8.2  RBC 4.34 4.20 3.19*  HGB 14.4 13.9 10.7*  HCT 41.2 41.7 31.4*  MCV 94.9 99.3 98.4  MCH 33.2 33.1 33.5  MCHC 35.0 33.3 34.1  RDW 13.0 13.5 13.2  PLT 338 334 276   Thyroid  Recent Labs  Lab 09/08/21 1524  TSH 4.241    BNP Recent Labs  Lab 09/07/21 1125  BNP 30.4    DDimer No results for input(s): DDIMER in the last 168 hours.   Radiology    CARDIAC CATHETERIZATION  Result Date: 09/08/2021   Mid RCA lesion is 85% stenosed.   LV Johnn Krasowski diastolic pressure is normal.   The left ventricular ejection fraction is greater than 65% by visual estimate. Patient has high-grade lesion in mid RCA with normal coronaries and normal ejection fraction.  Advised PCI with stenting of the RCA.  Advise Brilinta and aspirin.   CARDIAC CATHETERIZATION  Result Date: 09/08/2021 Percutaneous coronary intervention was requested on the right coronary artery by Dr. Humphrey Rolls after diagnostic angiography. However, the 6 French sheath was occlusive in the right common femoral artery due to significant disease in the right  external iliac artery.  As such, I had no option but to remove the sheath and achieve hemostasis with a Mynx grip closure device. We will plan on RCA PCI with Dr. Saunders Revel tomorrow via the right radial artery. The patient has significant leg claudication that can be managed in the outpatient setting.   DG Chest Portable 1 View  Result Date: 09/07/2021 CLINICAL DATA:  Chest pain EXAM: PORTABLE CHEST 1 VIEW COMPARISON:  None Available. FINDINGS: The cardiomediastinal silhouette is normal in contour. No pleural effusion. No pneumothorax. No acute pleuroparenchymal abnormality. Visualized abdomen is unremarkable. IMPRESSION: No acute cardiopulmonary abnormality. Electronically Signed   By: Valentino Saxon M.D.   On: 09/07/2021 12:00   CT Angio Chest/Abd/Pel for Dissection W and/or Wo Contrast  Result Date: 09/07/2021 CLINICAL DATA:  51 year old female with acute chest, abdominal and pelvic pain. Evaluate aorta. EXAM: CT ANGIOGRAPHY CHEST, ABDOMEN AND PELVIS TECHNIQUE: Non-contrast CT of the chest was initially obtained. Multidetector CT imaging through the chest, abdomen and pelvis was performed using the standard protocol during bolus administration of intravenous contrast. Multiplanar reconstructed images and MIPs were obtained and reviewed to evaluate the vascular anatomy. RADIATION DOSE REDUCTION: This exam was performed according to the departmental dose-optimization program which includes automated exposure control, adjustment of the mA and/or kV according to patient size and/or use of iterative reconstruction technique. CONTRAST:  70m OMNIPAQUE IOHEXOL 350 MG/ML SOLN COMPARISON:  09/07/2021 chest radiograph FINDINGS: CTA CHEST FINDINGS Cardiovascular: Moderate atherosclerotic plaque within the descending thoracic aorta noted. There is no evidence of thoracic aortic aneurysm or dissection. No pulmonary emboli are identified. Heart size is normal. No pericardial effusion identified. Coronary artery  atherosclerotic calcifications are present. Approximally 50% stenosis of the proximal LEFT subclavian artery noted. Mediastinum/Nodes: An 8 mm probable lymph node in the anterior mediastinum/pre-vascular region noted (series 4: Image 25). Lungs/Pleura: Lungs are clear. No pleural effusion or pneumothorax. Musculoskeletal: No acute or suspicious bony abnormalities are noted. Remote LEFT rib fractures noted. Review of the MIP images confirms the above findings. CTA ABDOMEN AND PELVIS FINDINGS VASCULAR Aorta: Heavy atherosclerotic plaque/calcification of the abdominal aorta noted without aneurysm or dissection. Celiac: Patent without evidence of aneurysm, dissection, vasculitis or significant stenosis. SMA: Approximately 50% stenosis of the proximal SMA noted from atherosclerotic plaque. No evidence of aneurysm. Renals: Approximately 50% stenosis of the proximal LEFT  renal artery noted. No other significant abnormalities. IMA: Patent without evidence of aneurysm, dissection, vasculitis or significant stenosis. Inflow: Moderate atherosclerotic plaque and calcifications noted in the common, external, and internal iliac arteries. 50-80% stenosis of portions of the portions of the LEFT common iliac, bilateral external iliac and bilateral internal iliac arteries noted. Veins: No obvious venous abnormality within the limitations of this arterial phase study. Review of the MIP images confirms the above findings. NON-VASCULAR Hepatobiliary: Hepatic steatosis noted. No focal hepatic abnormalities are identified. The gallbladder is unremarkable. There is no evidence of intrahepatic or extrahepatic biliary dilatation. Pancreas: Unremarkable Spleen: Unremarkable Adrenals/Urinary Tract: The kidneys, adrenal glands and bladder are unremarkable. Stomach/Bowel: Stomach is within normal limits. Appendix appears normal. No evidence of bowel wall thickening, distention, or inflammatory changes. Lymphatic: No abnormal lymph nodes  identified. Reproductive: Uterus and bilateral adnexa are unremarkable. Other: No ascites, focal collection or pneumoperitoneum. Musculoskeletal: No acute or suspicious bony abnormalities are noted. Review of the MIP images confirms the above findings. IMPRESSION: 1. No evidence of aortic aneurysm or dissection. No evidence of pulmonary emboli. 2. 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 3. 50-80% stenosis of portions of the LEFT common iliac, bilateral external iliac and bilateral internal iliac arteries. 4. Hepatic steatosis. 5. Coronary artery disease and aortic Atherosclerosis (ICD10-I70.0). Electronically Signed   By: Margarette Canada M.D.   On: 09/07/2021 14:29    Cardiac Studies   See above.  Patient Profile     51 y.o. female admitted with NSTEMI found to have LAD bridging and severe RCA disease.  Assessment & Plan    NSTEMI: No further angina.  PCI aborted yesterday due to severe PAD with 52F sheath in the right femoral artery occluding the iliac artery.  Post-procedure course also complicated by hematoma with significant drop in hemoglobin. -Plan for PCI tomorrow if hemoglobin stable.  I will recheck a CBC this afternoon and load her with clopidogrel if stable and no other signs of bleeding. -Continued medical therapy per Dr. Humphrey Rolls. -OK to eat today.  NPO after MN for possible cath tomorrow.  PAD: Significant bilateral iliac disease with reported claudication.  Further evaluation as an outpatient.   For questions or updates, please contact [place] Please consult [email or web address] for contact info under [place].     Signed, Nelva Bush, MD  09/09/2021, 8:10 AM

## 2021-09-10 ENCOUNTER — Encounter
Admission: EM | Disposition: A | Discharge status: Home / Self Care | Payer: Self-pay | Source: Home / Self Care | Attending: Internal Medicine

## 2021-09-10 DIAGNOSIS — I2510 Atherosclerotic heart disease of native coronary artery without angina pectoris: Secondary | ICD-10-CM | POA: Diagnosis not present

## 2021-09-10 DIAGNOSIS — I25110 Atherosclerotic heart disease of native coronary artery with unstable angina pectoris: Secondary | ICD-10-CM

## 2021-09-10 DIAGNOSIS — I25111 Atherosclerotic heart disease of native coronary artery with angina pectoris with documented spasm: Secondary | ICD-10-CM | POA: Diagnosis not present

## 2021-09-10 DIAGNOSIS — F17200 Nicotine dependence, unspecified, uncomplicated: Secondary | ICD-10-CM | POA: Diagnosis not present

## 2021-09-10 HISTORY — PX: CORONARY STENT INTERVENTION: CATH118234

## 2021-09-10 LAB — BASIC METABOLIC PANEL
Anion gap: 8 (ref 5–15)
BUN: 9 mg/dL (ref 6–20)
CO2: 25 mmol/L (ref 22–32)
Calcium: 9 mg/dL (ref 8.9–10.3)
Chloride: 98 mmol/L (ref 98–111)
Creatinine, Ser: 0.64 mg/dL (ref 0.44–1.00)
GFR, Estimated: >60 mL/min (ref >60)
Glucose, Bld: 89 mg/dL (ref 70–99)
Potassium: 3.5 mmol/L (ref 3.5–5.1)
Sodium: 131 mmol/L — ABNORMAL LOW (ref 135–145)

## 2021-09-10 LAB — CBC
HCT: 30.4 % — ABNORMAL LOW (ref 36.0–46.0)
Hemoglobin: 10.7 g/dL — ABNORMAL LOW (ref 12.0–15.0)
MCH: 34.6 pg — ABNORMAL HIGH (ref 26.0–34.0)
MCHC: 35.2 g/dL (ref 30.0–36.0)
MCV: 98.4 fL (ref 80.0–100.0)
Platelets: 246 10*3/uL (ref 150–400)
RBC: 3.09 MIL/uL — ABNORMAL LOW (ref 3.87–5.11)
RDW: 12.9 % (ref 11.5–15.5)
WBC: 9.4 10*3/uL (ref 4.0–10.5)
nRBC: 0 % (ref 0.0–0.2)

## 2021-09-10 LAB — MAGNESIUM: Magnesium: 2 mg/dL (ref 1.7–2.4)

## 2021-09-10 LAB — LIPOPROTEIN A (LPA): Lipoprotein (a): 67.6 nmol/L — ABNORMAL HIGH (ref <75.0)

## 2021-09-10 LAB — CORTISOL-AM, BLOOD: Cortisol - AM: 12.8 ug/dL (ref 6.7–22.6)

## 2021-09-10 LAB — HCV INTERPRETATION

## 2021-09-10 SURGERY — LEFT HEART CATH AND CORONARY ANGIOGRAPHY
Anesthesia: Moderate Sedation

## 2021-09-10 SURGERY — CORONARY STENT INTERVENTION
Anesthesia: Moderate Sedation

## 2021-09-10 MED ORDER — NITROGLYCERIN 1 MG/10 ML FOR IR/CATH LAB
INTRA_ARTERIAL | Status: DC | PRN
  Administered 2021-09-10: 200 ug via INTRACORONARY
  Administered 2021-09-10: 100 ug via INTRACORONARY

## 2021-09-10 MED ORDER — LIDOCAINE HCL 1 % IJ SOLN
INTRAMUSCULAR | Status: AC
  Filled 2021-09-10: qty 20

## 2021-09-10 MED ORDER — SODIUM CHLORIDE 0.9 % IV SOLN: 250.0000 mL | INTRAVENOUS | Status: DC | PRN

## 2021-09-10 MED ORDER — IOHEXOL 300 MG/ML  SOLN
INTRAMUSCULAR | Status: DC | PRN
  Administered 2021-09-10: 58 mL

## 2021-09-10 MED ORDER — HEPARIN (PORCINE) IN NACL 1000-0.9 UT/500ML-% IV SOLN
INTRAVENOUS | Status: AC
  Filled 2021-09-10: qty 1000

## 2021-09-10 MED ORDER — HEPARIN (PORCINE) IN NACL 1000-0.9 UT/500ML-% IV SOLN
INTRAVENOUS | Status: DC | PRN
  Administered 2021-09-10 (×2): 500 mL

## 2021-09-10 MED ORDER — SODIUM CHLORIDE 0.9 % WEIGHT BASED INFUSION: 3.0000 mL/kg/h | INTRAVENOUS | Status: DC

## 2021-09-10 MED ORDER — SODIUM CHLORIDE 0.9% FLUSH
3.0000 mL | Freq: Two times a day (BID) | INTRAVENOUS | Status: DC
  Administered 2021-09-10: 3 mL via INTRAVENOUS

## 2021-09-10 MED ORDER — HEPARIN SODIUM (PORCINE) 1000 UNIT/ML IJ SOLN
INTRAMUSCULAR | Status: AC
  Filled 2021-09-10: qty 10

## 2021-09-10 MED ORDER — MIDAZOLAM HCL 2 MG/2ML IJ SOLN
INTRAMUSCULAR | Status: DC | PRN
  Administered 2021-09-10: 1 mg via INTRAVENOUS

## 2021-09-10 MED ORDER — FENTANYL CITRATE (PF) 100 MCG/2ML IJ SOLN
INTRAMUSCULAR | Status: AC
  Filled 2021-09-10: qty 2

## 2021-09-10 MED ORDER — ASPIRIN 81 MG PO CHEW: 81.0000 mg | CHEWABLE_TABLET | ORAL | Status: DC

## 2021-09-10 MED ORDER — SODIUM CHLORIDE 0.9 % IV SOLN
INTRAVENOUS | Status: AC | PRN
  Administered 2021-09-10: 250 mL via INTRAVENOUS

## 2021-09-10 MED ORDER — SODIUM CHLORIDE 0.9% FLUSH: 3.0000 mL | INTRAVENOUS | Status: DC | PRN

## 2021-09-10 MED ORDER — MIDAZOLAM HCL 2 MG/2ML IJ SOLN
INTRAMUSCULAR | Status: AC
  Filled 2021-09-10: qty 2

## 2021-09-10 MED ORDER — HEPARIN SODIUM (PORCINE) 1000 UNIT/ML IJ SOLN
INTRAMUSCULAR | Status: DC | PRN
  Administered 2021-09-10: 5000 [IU] via INTRAVENOUS

## 2021-09-10 MED ORDER — FENTANYL CITRATE (PF) 100 MCG/2ML IJ SOLN
INTRAMUSCULAR | Status: DC | PRN
Start: 2021-09-10 — End: 2021-09-10
  Administered 2021-09-10: 50 ug via INTRAVENOUS

## 2021-09-10 MED ORDER — LIDOCAINE HCL (PF) 1 % IJ SOLN
INTRAMUSCULAR | Status: DC | PRN
  Administered 2021-09-10: 2 mL

## 2021-09-10 MED ORDER — SODIUM CHLORIDE 0.9 % WEIGHT BASED INFUSION: 1.0000 mL/kg/h | INTRAVENOUS | Status: DC

## 2021-09-10 MED ORDER — VERAPAMIL HCL 2.5 MG/ML IV SOLN
INTRAVENOUS | Status: AC
  Filled 2021-09-10: qty 2

## 2021-09-10 SURGICAL SUPPLY — 18 items
BALLN TREK RX 2.5X20 (BALLOONS) ×2
BALLN ~~LOC~~ EUPHORA RX 3.0X20 (BALLOONS) ×2
BALLOON TREK RX 2.5X20 (BALLOONS) IMPLANT
BALLOON ~~LOC~~ EUPHORA RX 3.0X20 (BALLOONS) IMPLANT
CATH VISTA GUIDE 6FR JR4 (CATHETERS) ×1 IMPLANT
DEVICE RAD TR BAND REGULAR (VASCULAR PRODUCTS) ×1 IMPLANT
DRAPE BRACHIAL (DRAPES) ×1 IMPLANT
GLIDESHEATH SLEND SS 6F .021 (SHEATH) ×1 IMPLANT
GUIDEWIRE INQWIRE 1.5J.035X260 (WIRE) IMPLANT
INQWIRE 1.5J .035X260CM (WIRE) ×2
KIT ENCORE 26 ADVANTAGE (KITS) ×1 IMPLANT
PACK CARDIAC CATH (CUSTOM PROCEDURE TRAY) ×2 IMPLANT
PROTECTION STATION PRESSURIZED (MISCELLANEOUS) ×2
SET ATX SIMPLICITY (MISCELLANEOUS) ×1 IMPLANT
STATION PROTECTION PRESSURIZED (MISCELLANEOUS) IMPLANT
STENT ONYX FRONTIER 2.75X38 (Permanent Stent) ×1 IMPLANT
TUBING CIL FLEX 10 FLL-RA (TUBING) ×1 IMPLANT
WIRE RUNTHROUGH .014X180CM (WIRE) ×1 IMPLANT

## 2021-09-10 NOTE — Interval H&P Note (Signed)
Cath Lab Visit (complete for each Cath Lab visit)  Clinical Evaluation Leading to the Procedure:   ACS: Yes.   Unstable angina  Non-ACS:  n/a  History and Physical Interval Note:  09/10/2021 2:27 PM  Shelva Majestic  has presented today for surgery, with the diagnosis of unstable angina.  The various methods of treatment have been discussed with the patient and family. After consideration of risks, benefits and other options for treatment, the patient has consented to  Procedure(s): CORONARY STENT INTERVENTION (N/A) as a surgical intervention.  The patient's history has been reviewed, patient examined, no change in status, stable for surgery.  I have reviewed the patient's chart and labs.  Questions were answered to the patient's satisfaction.     Kathlyn Sacramento

## 2021-09-10 NOTE — H&P (View-Only) (Signed)
Progress Note  Patient Name: Sonya Harmon Date of Encounter: 09/10/2021   Cardiologist: Dr. Humphrey Rolls  Subjective   Denies chest pain or shortness of breath, awaiting left heart cardiac radial approach today.  Inpatient Medications    Scheduled Meds:  aspirin  81 mg Oral Daily   atorvastatin  80 mg Oral Daily   clopidogrel  75 mg Oral Daily   metoprolol tartrate  12.5 mg Oral BID   pantoprazole  40 mg Oral Daily   sodium bicarbonate  650 mg Oral TID   sodium chloride flush  3 mL Intravenous Q12H   sodium chloride flush  3 mL Intravenous Q12H   sodium chloride flush  3 mL Intravenous Q12H   Continuous Infusions:  sodium chloride     sodium chloride     PRN Meds: sodium chloride, sodium chloride, acetaminophen **OR** acetaminophen, acetaminophen, LORazepam, morphine injection, nicotine, nitroGLYCERIN, ondansetron **OR** ondansetron (ZOFRAN) IV, ondansetron (ZOFRAN) IV, senna-docusate, sodium chloride flush, sodium chloride flush   Vital Signs    Vitals:   09/09/21 2006 09/09/21 2352 09/10/21 0458 09/10/21 1202  BP: 129/61 138/78 (!) 159/77 123/77  Pulse: 66 (!) 58 61 70  Resp: '18 17 17 17  '$ Temp: 97.7 F (36.5 C) 97.8 F (36.6 C) 97.8 F (36.6 C) 98.3 F (36.8 C)  TempSrc:      SpO2: 100% 100% 100% 100%  Weight:      Height:        Intake/Output Summary (Last 24 hours) at 09/10/2021 1328 Last data filed at 09/10/2021 0845 Gross per 24 hour  Intake 240 ml  Output 150 ml  Net 90 ml      09/07/2021   10:59 AM 09/07/2021   10:54 AM  Last 3 Weights  Weight (lbs) 104 lb 15 oz 105 lb  Weight (kg) 47.6 kg 47.628 kg      Telemetry    Sinus rhythm/sinus bradycardia heart rate 59- Personally Reviewed  ECG     - Personally Reviewed  Physical Exam   GEN: No acute distress.   Neck: No JVD Cardiac: RRR, no murmurs, rubs, or gallops.  Respiratory: Clear to auscultation bilaterally. GI: Soft, nontender, non-distended  MS: No edema; No deformity. Neuro:  Nonfocal   Psych: Normal affect   Labs    High Sensitivity Troponin:   Recent Labs  Lab 09/07/21 1125 09/07/21 1308  TROPONINIHS 6 7     Chemistry Recent Labs  Lab 09/07/21 1125 09/08/21 0722 09/09/21 0541 09/10/21 0338  NA 132* 136 131* 131*  K 3.6 4.1 4.1 3.5  CL 100 102 100 98  CO2 22 18* 22 25  GLUCOSE 124* 51* 69* 89  BUN '6 11 11 9  '$ CREATININE 0.60 0.76 0.78 0.64  CALCIUM 9.1 9.5 9.1 9.0  MG 2.1  --  2.1 2.0  PROT 8.2*  --   --   --   ALBUMIN 4.7  --   --   --   AST 121*  --   --   --   ALT 74*  --   --   --   ALKPHOS 76  --   --   --   BILITOT 1.0  --   --   --   GFRNONAA >60 >60 >60 >60  ANIONGAP 10 16* 9 8    Lipids  Recent Labs  Lab 09/09/21 0541  CHOL 177  TRIG 30  HDL 95  LDLCALC 76  CHOLHDL 1.9    Hematology Recent  Labs  Lab 09/08/21 0722 09/09/21 0541 09/09/21 1552  WBC 9.7 8.2 7.9  RBC 4.20 3.19* 3.15*  HGB 13.9 10.7* 10.7*  HCT 41.7 31.4* 30.7*  MCV 99.3 98.4 97.5  MCH 33.1 33.5 34.0  MCHC 33.3 34.1 34.9  RDW 13.5 13.2 13.2  PLT 334 276 259   Thyroid  Recent Labs  Lab 09/08/21 1524  TSH 4.241    BNP Recent Labs  Lab 09/07/21 1125  BNP 30.4    DDimer No results for input(s): DDIMER in the last 168 hours.   Radiology    No results found.  Cardiac Studies     Patient Profile     51 y.o. female with history of CAD, tobacco use being seen due to CAD.  Left heart cath showed significant RCA stenosis, cardiac cath pending today.  Assessment & Plan    CAD, severe RCA stenosis -Denies chest pain -Left heart cath and possible PCI today -Continue aspirin and Lipitor, Plavix, Lopressor. -Further recommendations pending left heart cath results.  2. Smoking -Cessation recommended       Signed, Kate Sable, MD  09/10/2021, 1:28 PM

## 2021-09-10 NOTE — Progress Notes (Signed)
Progress Note  Patient Name: Sonya Harmon Date of Encounter: 09/10/2021   Cardiologist: Dr. Humphrey Rolls  Subjective   Denies chest pain or shortness of breath, awaiting left heart cardiac radial approach today.  Inpatient Medications    Scheduled Meds:  aspirin  81 mg Oral Daily   atorvastatin  80 mg Oral Daily   clopidogrel  75 mg Oral Daily   metoprolol tartrate  12.5 mg Oral BID   pantoprazole  40 mg Oral Daily   sodium bicarbonate  650 mg Oral TID   sodium chloride flush  3 mL Intravenous Q12H   sodium chloride flush  3 mL Intravenous Q12H   sodium chloride flush  3 mL Intravenous Q12H   Continuous Infusions:  sodium chloride     sodium chloride     PRN Meds: sodium chloride, sodium chloride, acetaminophen **OR** acetaminophen, acetaminophen, LORazepam, morphine injection, nicotine, nitroGLYCERIN, ondansetron **OR** ondansetron (ZOFRAN) IV, ondansetron (ZOFRAN) IV, senna-docusate, sodium chloride flush, sodium chloride flush   Vital Signs    Vitals:   09/09/21 2006 09/09/21 2352 09/10/21 0458 09/10/21 1202  BP: 129/61 138/78 (!) 159/77 123/77  Pulse: 66 (!) 58 61 70  Resp: '18 17 17 17  '$ Temp: 97.7 F (36.5 C) 97.8 F (36.6 C) 97.8 F (36.6 C) 98.3 F (36.8 C)  TempSrc:      SpO2: 100% 100% 100% 100%  Weight:      Height:        Intake/Output Summary (Last 24 hours) at 09/10/2021 1328 Last data filed at 09/10/2021 0845 Gross per 24 hour  Intake 240 ml  Output 150 ml  Net 90 ml      09/07/2021   10:59 AM 09/07/2021   10:54 AM  Last 3 Weights  Weight (lbs) 104 lb 15 oz 105 lb  Weight (kg) 47.6 kg 47.628 kg      Telemetry    Sinus rhythm/sinus bradycardia heart rate 59- Personally Reviewed  ECG     - Personally Reviewed  Physical Exam   GEN: No acute distress.   Neck: No JVD Cardiac: RRR, no murmurs, rubs, or gallops.  Respiratory: Clear to auscultation bilaterally. GI: Soft, nontender, non-distended  MS: No edema; No deformity. Neuro:  Nonfocal   Psych: Normal affect   Labs    High Sensitivity Troponin:   Recent Labs  Lab 09/07/21 1125 09/07/21 1308  TROPONINIHS 6 7     Chemistry Recent Labs  Lab 09/07/21 1125 09/08/21 0722 09/09/21 0541 09/10/21 0338  NA 132* 136 131* 131*  K 3.6 4.1 4.1 3.5  CL 100 102 100 98  CO2 22 18* 22 25  GLUCOSE 124* 51* 69* 89  BUN '6 11 11 9  '$ CREATININE 0.60 0.76 0.78 0.64  CALCIUM 9.1 9.5 9.1 9.0  MG 2.1  --  2.1 2.0  PROT 8.2*  --   --   --   ALBUMIN 4.7  --   --   --   AST 121*  --   --   --   ALT 74*  --   --   --   ALKPHOS 76  --   --   --   BILITOT 1.0  --   --   --   GFRNONAA >60 >60 >60 >60  ANIONGAP 10 16* 9 8    Lipids  Recent Labs  Lab 09/09/21 0541  CHOL 177  TRIG 30  HDL 95  LDLCALC 76  CHOLHDL 1.9    Hematology Recent  Labs  Lab 09/08/21 0722 09/09/21 0541 09/09/21 1552  WBC 9.7 8.2 7.9  RBC 4.20 3.19* 3.15*  HGB 13.9 10.7* 10.7*  HCT 41.7 31.4* 30.7*  MCV 99.3 98.4 97.5  MCH 33.1 33.5 34.0  MCHC 33.3 34.1 34.9  RDW 13.5 13.2 13.2  PLT 334 276 259   Thyroid  Recent Labs  Lab 09/08/21 1524  TSH 4.241    BNP Recent Labs  Lab 09/07/21 1125  BNP 30.4    DDimer No results for input(s): DDIMER in the last 168 hours.   Radiology    No results found.  Cardiac Studies     Patient Profile     51 y.o. female with history of CAD, tobacco use being seen due to CAD.  Left heart cath showed significant RCA stenosis, cardiac cath pending today.  Assessment & Plan    CAD, severe RCA stenosis -Denies chest pain -Left heart cath and possible PCI today -Continue aspirin and Lipitor, Plavix, Lopressor. -Further recommendations pending left heart cath results.  2. Smoking -Cessation recommended       Signed, Kate Sable, MD  09/10/2021, 1:28 PM

## 2021-09-10 NOTE — Progress Notes (Signed)
  Progress Note   Patient: Sonya Harmon PPJ:[id number] DOB: [birth year] DOA: 09/07/2021     2 DOS: the patient was seen and examined on 09/10/2021   Brief hospital course: [name] is a 51 year old female with history of poor healthcare maintenance, has not seen a doctor in a long time, heavy tobacco use, heavy tobacco dependence, who presents emergency department for chief concerns of generalized abdominal pain for several months. CT scan of the chest/abdomen/pelvis with contrast showed 50% stenosis in superior mesenteric arteries.  Peripheral vascular disease. Heart catheter was performed by cardiology on 6/5, showed RCA stenosis, intervention scheduled on 6/7     Assessment and Plan: Abdominal pain. Chest pain appears to be secondary to gastritis.  Another possibility is due to angina from RCA.   --cont PPI  Coronary artery disease. --heart cath today  Anxiety.  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  This is most likely not the source of abdominal pain.   --Patient can follow-up with vascular surgery as outpatient.  Peripheral arterial disease. Outpatient follow-up with Dr. Fletcher Anon   Metabolic acidosis. --resolved after Na bicarb   Hyponatremia, mild --of unclear clinical significance  Liver function changes. acute hepatitis panel neg        Subjective:  Abdominal pain resolved.     Physical Exam:  Constitutional: NAD, AAOx3 HEENT: conjunctivae and lids normal, EOMI CV: No cyanosis.   RESP: normal respiratory effort, on RA Neuro: II - XII grossly intact.   Psych: labile mood and affect.     Data Reviewed:  Lab results reviewed  Family Communication:   Disposition: Status is: Inpatient Remains inpatient appropriate because: Severity of disease, pending procedure.  Planned Discharge Destination: Home    Time spent: 25 minutes  Author: Enzo Bi, MD 09/10/2021 6:01 PM  For on call  review [email or web address].

## 2021-09-11 ENCOUNTER — Telehealth: Payer: Self-pay

## 2021-09-11 ENCOUNTER — Encounter: Payer: Self-pay | Admitting: Cardiovascular Disease

## 2021-09-11 DIAGNOSIS — I739 Peripheral vascular disease, unspecified: Secondary | ICD-10-CM

## 2021-09-11 DIAGNOSIS — I25111 Atherosclerotic heart disease of native coronary artery with angina pectoris with documented spasm: Secondary | ICD-10-CM | POA: Diagnosis not present

## 2021-09-11 LAB — CBC
HCT: 30.1 % — ABNORMAL LOW (ref 36.0–46.0)
Hemoglobin: 10.4 g/dL — ABNORMAL LOW (ref 12.0–15.0)
MCH: 33.8 pg (ref 26.0–34.0)
MCHC: 34.6 g/dL (ref 30.0–36.0)
MCV: 97.7 fL (ref 80.0–100.0)
Platelets: 251 10*3/uL (ref 150–400)
RBC: 3.08 MIL/uL — ABNORMAL LOW (ref 3.87–5.11)
RDW: 12.8 % (ref 11.5–15.5)
WBC: 7.8 10*3/uL (ref 4.0–10.5)
nRBC: 0 % (ref 0.0–0.2)

## 2021-09-11 LAB — HEPATITIS PANEL, ACUTE

## 2021-09-11 LAB — BASIC METABOLIC PANEL
Anion gap: 8 (ref 5–15)
BUN: 7 mg/dL (ref 6–20)
CO2: 24 mmol/L (ref 22–32)
Calcium: 9 mg/dL (ref 8.9–10.3)
Chloride: 103 mmol/L (ref 98–111)
Creatinine, Ser: 0.54 mg/dL (ref 0.44–1.00)
GFR, Estimated: >60 mL/min (ref >60)
Glucose, Bld: 98 mg/dL (ref 70–99)
Potassium: 3.5 mmol/L (ref 3.5–5.1)
Sodium: 135 mmol/L (ref 135–145)

## 2021-09-11 LAB — POCT ACTIVATED CLOTTING TIME: Activated Clotting Time: 353 seconds

## 2021-09-11 LAB — MAGNESIUM: Magnesium: 2 mg/dL (ref 1.7–2.4)

## 2021-09-11 MED ORDER — NICOTINE 14 MG/24HR TD PT24: 14.0000 mg | MEDICATED_PATCH | Freq: Every day | TRANSDERMAL | 0 refills | Status: DC | PRN

## 2021-09-11 MED ORDER — CLOPIDOGREL BISULFATE 75 MG PO TABS: 75.0000 mg | ORAL_TABLET | Freq: Every day | ORAL | 11 refills | Status: DC

## 2021-09-11 MED ORDER — ASPIRIN 81 MG PO CHEW
81.0000 mg | CHEWABLE_TABLET | Freq: Every day | ORAL | Status: DC
Start: 2021-09-11 — End: 2021-11-02

## 2021-09-11 MED ORDER — ATORVASTATIN CALCIUM 80 MG PO TABS: 80.0000 mg | ORAL_TABLET | Freq: Every day | ORAL | 2 refills | Status: DC

## 2021-09-11 MED ORDER — METOPROLOL TARTRATE 25 MG PO TABS: 12.5000 mg | ORAL_TABLET | Freq: Two times a day (BID) | ORAL | 2 refills | Status: DC

## 2021-09-11 NOTE — Progress Notes (Signed)
Patient discharged to home. Tele and IV d/c'd. Patient verbalizes understanding of discharge instructions. Discharge care regarding post cardiac cath site care went over with and given to patient.

## 2021-09-11 NOTE — Telephone Encounter (Signed)
-----   Message from Wellington Hampshire, MD sent at 09/11/2021  4:24 PM EDT ----- This patient was discharged from [facility] today.  Please schedule her for lower extremity arterial segmental Doppler.  Follow-up with me in 1 to 2 weeks.

## 2021-09-11 NOTE — Progress Notes (Signed)
Progress Note  Patient Name: Sonya Harmon Date of Encounter: 09/11/2021  [facility] Cardiologist: Dr Humphrey Rolls  Subjective   Patient seen on AM rounds. Denies any chest pain or shortness of breath. Denies any groin pain. Underwent successful DES placement to the mid RCA via right radial approach on 09/10/2021.  Inpatient Medications    Scheduled Meds:  aspirin  81 mg Oral Daily   atorvastatin  80 mg Oral Daily   clopidogrel  75 mg Oral Daily   metoprolol tartrate  12.5 mg Oral BID   pantoprazole  40 mg Oral Daily   sodium bicarbonate  650 mg Oral TID   sodium chloride flush  3 mL Intravenous Q12H   sodium chloride flush  3 mL Intravenous Q12H   sodium chloride flush  3 mL Intravenous Q12H   sodium chloride flush  3 mL Intravenous Q12H   Continuous Infusions:  sodium chloride     sodium chloride     PRN Meds: sodium chloride, sodium chloride, acetaminophen, morphine injection, nicotine, nitroGLYCERIN, ondansetron **OR** ondansetron (ZOFRAN) IV, ondansetron (ZOFRAN) IV, senna-docusate, sodium chloride flush, sodium chloride flush   Vital Signs    Vitals:   09/10/21 2001 09/10/21 2358 09/11/21 0408 09/11/21 0811  BP: 114/71 110/66 121/65 (!) 144/83  Pulse: 76 64 61 86  Resp: '16 18 15 19  '$ Temp:  97.8 F (36.6 C) (!) 97.1 F (36.2 C) 98 F (36.7 C)  TempSrc:  Oral Oral   SpO2:  100%  100%  Weight:      Height:        Intake/Output Summary (Last 24 hours) at 09/11/2021 0832 Last data filed at 09/10/2021 0845 Gross per 24 hour  Intake 240 ml  Output --  Net 240 ml      09/10/2021    1:50 PM 09/07/2021   10:59 AM 09/07/2021   10:54 AM  Last 3 Weights  Weight (lbs) 105 lb 104 lb 15 oz 105 lb  Weight (kg) 47.628 kg 47.6 kg 47.628 kg      Telemetry    SR rates 60-80- Personally Reviewed  ECG    No new tracings- Personally Reviewed  Physical Exam   GEN: No acute distress. Sitting upright in bed watching TV.  Neck: No JVD appreciated Cardiac: RRR, no murmurs,  rubs, or gallops. Right radial cath site with small amount of bruising at site without bleeding or hematoma, opsite and gauze dressing with old blood noted. 2+ pulse. Right groin site with bruising without hematoma or bleeding, 1+ pulse without bruit Respiratory: Clear to auscultation bilaterally. Respirations are unlabored on room air. GI: Soft, nontender, non-distended  MS: No edema; No deformity.  Neuro:  Nonfocal  Psych: Normal affect   Labs    High Sensitivity Troponin:   Recent Labs  Lab 09/07/21 1125 09/07/21 1308  TROPONINIHS 6 7     Chemistry Recent Labs  Lab 09/07/21 1125 09/08/21 0722 09/09/21 0541 09/10/21 0338 09/11/21 0604  NA 132*   < > 131* 131* 135  K 3.6   < > 4.1 3.5 3.5  CL 100   < > 100 98 103  CO2 22   < > '22 25 24  '$ GLUCOSE 124*   < > 69* 89 98  BUN 6   < > '11 9 7  '$ CREATININE 0.60   < > 0.78 0.64 0.54  CALCIUM 9.1   < > 9.1 9.0 9.0  MG 2.1  --  2.1 2.0 2.0  PROT  8.2*  --   --   --   --   ALBUMIN 4.7  --   --   --   --   AST 121*  --   --   --   --   ALT 74*  --   --   --   --   ALKPHOS 76  --   --   --   --   BILITOT 1.0  --   --   --   --   GFRNONAA >60   < > >60 >60 >60  ANIONGAP 10   < > '9 8 8   '$ < > = values in this interval not displayed.    Lipids  Recent Labs  Lab 09/09/21 0541  CHOL 177  TRIG 30  HDL 95  LDLCALC 76  CHOLHDL 1.9    Hematology Recent Labs  Lab 09/09/21 1552 09/10/21 1337 09/11/21 0604  WBC 7.9 9.4 7.8  RBC 3.15* 3.09* 3.08*  HGB 10.7* 10.7* 10.4*  HCT 30.7* 30.4* 30.1*  MCV 97.5 98.4 97.7  MCH 34.0 34.6* 33.8  MCHC 34.9 35.2 34.6  RDW 13.2 12.9 12.8  PLT 259 246 251   Thyroid  Recent Labs  Lab 09/08/21 1524  TSH 4.241    BNP Recent Labs  Lab 09/07/21 1125  BNP 30.4    DDimer No results for input(s): "DDIMER" in the last 168 hours.   Radiology    CARDIAC CATHETERIZATION  Result Date: 09/10/2021   Prox RCA to Mid RCA lesion is 90% stenosed.   Prox RCA lesion is 30% stenosed.   A  drug-eluting stent was successfully placed using a STENT ONYX FRONTIER [id number].   Post intervention, there is a 0% residual stenosis. Successful angioplasty and drug-eluting stent placement to the mid right coronary artery. Recommendations: Dual antiplatelet therapy for at least 6 months. Smoking cessation. Aggressive treatment of risk factors. The patient can be discharged home tomorrow morning.  She can follow-up with me for management of peripheral arterial disease as an outpatient.    Cardiac Studies  Coronary Stent Intervention completed on 09/10/2021  Prox RCA to Mid RCA lesion is 90% stenosed.   Prox RCA lesion is 30% stenosed.   A drug-eluting stent was successfully placed using a STENT ONYX FRONTIER [id number].   Post intervention, there is a 0% residual stenosis.   Successful angioplasty and drug-eluting stent placement to the mid right coronary artery.  Diagnostic Dominance: Right  Intervention     Left Heart Cath and Coronary Angiography completed on 09/08/2021   Mid RCA lesion is 85% stenosed.   LV end diastolic pressure is normal.   The left ventricular ejection fraction is greater than 65% by visual estimate. Diagnostic Dominance: Right   Patient Profile     51 y.o. female with a history of coronary artery disease and tobacco use who is being seen due to NSTEMI found to have LAD bridging and severe RCA disease on left heart catheterization. She underwent coronary stenting to the mid RCA on 09/10/2021.  Assessment & Plan    Coronary artery disease with severe RCA stenosis - underwent successful stented with Onyx frontier 2.75 x 38 mm DES to the mid RCA on 09/10/2021 - Continue asa 81 mg daily - Continue plavix 75 mg daily - Continue metoprolol tartrate 12.5 mg bid - Continue atorvastatin 80 mg daily - Will need cardiac rehab scheduled   2. 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 Tobacco abuse - smoking cessation recommended    For questions or updates, please contact [place] Please consult [email or web address] for contact info under        Signed, Amadi Yoshino, NP  09/11/2021, 8:32 AM

## 2021-09-11 NOTE — Telephone Encounter (Signed)
Ordered as requested. Sending to scheduling.

## 2021-09-11 NOTE — Discharge Summary (Incomplete)
Physician Discharge Summary   Sonya Harmon  female DOB: [birth year]  QPY:[id number]  PCP: Pcp, No  Admit date: 09/07/2021 Discharge date: 09/11/2021  Admitted From: home Disposition:  home CODE STATUS: Full code  Discharge Instructions     AMB Referral to Cardiac Rehabilitation - Phase II   Complete by: As directed    Diagnosis: Coronary Stents   After initial evaluation and assessments completed: Virtual Based Care may be provided alone or in conjunction with Phase 2 Cardiac Rehab based on patient barriers.: Yes   Diet - low sodium heart healthy   Complete by: As directed    Discharge instructions   Complete by: As directed    You have received a cardiac stent.  You will need to take aspirin 81 mg and plavix 75 mg daily together for 1 year without missing a dose.   Dr. Enzo Bi [facility] Course:  For full details, please see H&P, progress notes, consult notes and ancillary notes.  Briefly,  [name] is a 51 year old female with history of poor healthcare maintenance, has not seen a doctor in a long time, heavy tobacco use, who presented to emergency department for chief concerns of generalized abdominal pain for several months. CT scan of the chest/abdomen/pelvis with contrast showed 50% stenosis in superior mesenteric arteries.  Peripheral vascular disease.  Heart catheter was performed by cardiology on 6/5, showed RCA stenosis.  Coronary artery disease S/p DES placement to the mid RCA  --cont ASA and plavix for a year.   --cont Lipitor 80 mg daily --F/u with Dr. Fletcher Anon   NSTEMI, ruled out --trop neg x2  Abdominal pain. --unclear etiology.  Resolved prior to discharge.  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  This is most likely not the source of abdominal pain.   --Patient can follow-up with vascular surgery as outpatient.  Peripheral arterial disease. - significant bilateral iliac  disease with reported claudication Outpatient follow-up with Dr. Fletcher Anon  Tobacco abuse - smoking cessation recommended   Metabolic acidosis. --resolved after Na bicarb    Hyponatremia, mild --of unclear clinical significance  Liver function changes. acute hepatitis panel neg   Discharge Diagnoses:  Principal Problem:   Abdominal pain Active Problems:   Tobacco dependence   Leg pain, bilateral   Alcohol abuse   Hepatic steatosis   Does not have primary care provider   Coronary artery disease involving native coronary artery of native heart with angina pectoris with documented spasm ([place])   Metabolic acidosis   Superior mesenteric artery stenosis (HCC)   PAD (peripheral artery disease) ([place])   Unstable angina ([place])   Hyponatremia   Hypoglycemia   Coronary artery disease without angina pectoris   30 Day Unplanned Readmission Risk Score    Flowsheet Row ED to Hosp-Admission (Current) from 09/07/2021 in [place] [facility]  30 Day Unplanned Readmission Risk Score (%) 8.01 Filed at 09/11/2021 0801       This score is the patient's risk of an unplanned readmission within 30 days of being discharged (0 -100%). The score is based on dignosis, age, lab data, medications, orders, and past utilization.   Low:  0-14.9   Medium: 15-21.9   High: 22-29.9   Extreme: 30 and above         Discharge Instructions:  Allergies as of 09/11/2021       Reactions   Amoxicillin Rash  Medication List     TAKE these medications    aspirin 81 MG chewable tablet Chew 1 tablet (81 mg total) by mouth daily.   atorvastatin 80 MG tablet Commonly known as: LIPITOR Take 1 tablet (80 mg total) by mouth daily.   clopidogrel 75 MG tablet Commonly known as: PLAVIX Take 1 tablet (75 mg total) by mouth daily.   ibuprofen 200 MG tablet Commonly known as: ADVIL Take 400-600 mg by mouth every 6 (six) hours as needed for mild pain or moderate pain.   metoprolol tartrate  25 MG tablet Commonly known as: LOPRESSOR Take 0.5 tablets (12.5 mg total) by mouth 2 (two) times daily.   nicotine 14 mg/24hr patch Commonly known as: NICODERM CQ - dosed in mg/24 hours Place 1 patch (14 mg total) onto the skin daily as needed (nicotine craving).         Follow-up Information     Wellington Hampshire, MD Follow up in 1 week(s).   Specialty: Cardiology Contact information: [address] [place] [address] [phone]                 Allergies  Allergen Reactions   Amoxicillin Rash     The results of significant diagnostics from this hospitalization (including imaging, microbiology, ancillary and laboratory) are listed below for reference.   Consultations:   Procedures/Studies: CARDIAC CATHETERIZATION  Result Date: 09/10/2021   Prox RCA to Mid RCA lesion is 90% stenosed.   Prox RCA lesion is 30% stenosed.   A drug-eluting stent was successfully placed using a STENT ONYX FRONTIER [id number].   Post intervention, there is a 0% residual stenosis. Successful angioplasty and drug-eluting stent placement to the mid right coronary artery. Recommendations: Dual antiplatelet therapy for at least 6 months. Smoking cessation. Aggressive treatment of risk factors. The patient can be discharged home tomorrow morning.  She can follow-up with me for management of peripheral arterial disease as an outpatient.   CARDIAC CATHETERIZATION  Result Date: 09/08/2021   Mid RCA lesion is 85% stenosed.   LV end diastolic pressure is normal.   The left ventricular ejection fraction is greater than 65% by visual estimate. Patient has high-grade lesion in mid RCA with normal coronaries and normal ejection fraction.  Advised PCI with stenting of the RCA.  Advise Brilinta and aspirin.   CARDIAC CATHETERIZATION  Result Date: 09/08/2021 Percutaneous coronary intervention was requested on the right coronary artery by Dr. Humphrey Rolls after diagnostic angiography. However, the 6  French sheath was occlusive in the right common femoral artery due to significant disease in the right external iliac artery.  As such, I had no option but to remove the sheath and achieve hemostasis with a Mynx grip closure device. We will plan on RCA PCI with Dr. Saunders Revel tomorrow via the right radial artery. The patient has significant leg claudication that can be managed in the outpatient setting.   CT Angio Chest/Abd/Pel for Dissection W and/or Wo Contrast  Result Date: 09/07/2021 CLINICAL DATA:  51 year old female with acute chest, abdominal and pelvic pain. Evaluate aorta. EXAM: CT ANGIOGRAPHY CHEST, ABDOMEN AND PELVIS TECHNIQUE: Non-contrast CT of the chest was initially obtained. Multidetector CT imaging through the chest, abdomen and pelvis was performed using the standard protocol during bolus administration of intravenous contrast. Multiplanar reconstructed images and MIPs were obtained and reviewed to evaluate the vascular anatomy. RADIATION DOSE REDUCTION: This exam was performed according to the departmental dose-optimization program which includes automated exposure control, adjustment of  the mA and/or kV according to patient size and/or use of iterative reconstruction technique. CONTRAST:  49m OMNIPAQUE IOHEXOL 350 MG/ML SOLN COMPARISON:  09/07/2021 chest radiograph FINDINGS: CTA CHEST FINDINGS Cardiovascular: Moderate atherosclerotic plaque within the descending thoracic aorta noted. There is no evidence of thoracic aortic aneurysm or dissection. No pulmonary emboli are identified. Heart size is normal. No pericardial effusion identified. Coronary artery atherosclerotic calcifications are present. Approximally 50% stenosis of the proximal LEFT subclavian artery noted. Mediastinum/Nodes: An 8 mm probable lymph node in the anterior mediastinum/pre-vascular region noted (series 4: Image 25). Lungs/Pleura: Lungs are clear. No pleural effusion or pneumothorax. Musculoskeletal: No acute or suspicious bony  abnormalities are noted. Remote LEFT rib fractures noted. Review of the MIP images confirms the above findings. CTA ABDOMEN AND PELVIS FINDINGS VASCULAR Aorta: Heavy atherosclerotic plaque/calcification of the abdominal aorta noted without aneurysm or dissection. Celiac: Patent without evidence of aneurysm, dissection, vasculitis or significant stenosis. SMA: Approximately 50% stenosis of the proximal SMA noted from atherosclerotic plaque. No evidence of aneurysm. Renals: Approximately 50% stenosis of the proximal LEFT renal artery noted. No other significant abnormalities. IMA: Patent without evidence of aneurysm, dissection, vasculitis or significant stenosis. Inflow: Moderate atherosclerotic plaque and calcifications noted in the common, external, and internal iliac arteries. 50-80% stenosis of portions of the portions of the LEFT common iliac, bilateral external iliac and bilateral internal iliac arteries noted. Veins: No obvious venous abnormality within the limitations of this arterial phase study. Review of the MIP images confirms the above findings. NON-VASCULAR Hepatobiliary: Hepatic steatosis noted. No focal hepatic abnormalities are identified. The gallbladder is unremarkable. There is no evidence of intrahepatic or extrahepatic biliary dilatation. Pancreas: Unremarkable Spleen: Unremarkable Adrenals/Urinary Tract: The kidneys, adrenal glands and bladder are unremarkable. Stomach/Bowel: Stomach is within normal limits. Appendix appears normal. No evidence of bowel wall thickening, distention, or inflammatory changes. Lymphatic: No abnormal lymph nodes identified. Reproductive: Uterus and bilateral adnexa are unremarkable. Other: No ascites, focal collection or pneumoperitoneum. Musculoskeletal: No acute or suspicious bony abnormalities are noted. Review of the MIP images confirms the above findings. IMPRESSION: 1. No evidence of aortic aneurysm or dissection. No evidence of pulmonary emboli. 2.  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 3. 50-80% stenosis of portions of the LEFT common iliac, bilateral external iliac and bilateral internal iliac arteries. 4. Hepatic steatosis. 5. Coronary artery disease and aortic Atherosclerosis (ICD10-I70.0). Electronically Signed   By: JMargarette CanadaM.D.   On: 09/07/2021 14:29   DG Chest Portable 1 View  Result Date: 09/07/2021 CLINICAL DATA:  Chest pain EXAM: PORTABLE CHEST 1 VIEW COMPARISON:  None Available. FINDINGS: The cardiomediastinal silhouette is normal in contour. No pleural effusion. No pneumothorax. No acute pleuroparenchymal abnormality. Visualized abdomen is unremarkable. IMPRESSION: No acute cardiopulmonary abnormality. Electronically Signed   By: SValentino SaxonM.D.   On: 09/07/2021 12:00      Labs: BNP (last 3 results) Recent Labs    09/07/21 1125  BNP 376.7  Basic Metabolic Panel: Recent Labs  Lab 09/07/21 1125 09/08/21 0722 09/09/21 0541 09/10/21 0338 09/11/21 0604  NA 132* 136 131* 131* 135  K 3.6 4.1 4.1 3.5 3.5  CL 100 102 100 98 103  CO2 22 18* '22 25 24  '$ GLUCOSE 124* 51* 69* 89 98  BUN '6 11 11 9 7  '$ CREATININE 0.60 0.76 0.78 0.64 0.54  CALCIUM 9.1 9.5 9.1 9.0 9.0  MG 2.1  --  2.1 2.0 2.0  Liver Function Tests: Recent Labs  Lab 09/07/21 1125  AST 121*  ALT 74*  ALKPHOS 76  BILITOT 1.0  PROT 8.2*  ALBUMIN 4.7   Recent Labs  Lab 09/07/21 1125  LIPASE 33   No results for input(s): "AMMONIA" in the last 168 hours. CBC: Recent Labs  Lab 09/07/21 1125 09/08/21 0722 09/09/21 0541 09/09/21 1552 09/10/21 1337 09/11/21 0604  WBC 8.9 9.7 8.2 7.9 9.4 7.8  NEUTROABS 6.3  --   --   --   --   --   HGB 14.4 13.9 10.7* 10.7* 10.7* 10.4*  HCT 41.2 41.7 31.4* 30.7* 30.4* 30.1*  MCV 94.9 99.3 98.4 97.5 98.4 97.7  PLT 338 334 276 259 246 251   Cardiac Enzymes: No results for input(s): "CKTOTAL", "CKMB", "CKMBINDEX", "TROPONINI" in the  last 168 hours. BNP: Invalid input(s): "POCBNP" CBG: No results for input(s): "GLUCAP" in the last 168 hours. D-Dimer No results for input(s): "DDIMER" in the last 72 hours. Hgb A1c No results for input(s): "HGBA1C" in the last 72 hours. Lipid Profile Recent Labs    09/09/21 0541  CHOL 177  HDL 95  LDLCALC 76  TRIG 30  CHOLHDL 1.9   Thyroid function studies Recent Labs    09/08/21 1524  TSH 4.241   Anemia work up No results for input(s): "VITAMINB12", "FOLATE", "FERRITIN", "TIBC", "IRON", "RETICCTPCT" in the last 72 hours. Urinalysis    Component Value Date/Time   COLORURINE COLORLESS (A) 09/07/2021 1308   APPEARANCEUR CLEAR (A) 09/07/2021 1308   LABSPEC 1.001 (L) 09/07/2021 1308   PHURINE 6.0 09/07/2021 1308   GLUCOSEU NEGATIVE 09/07/2021 1308   HGBUR NEGATIVE 09/07/2021 1308   BILIRUBINUR NEGATIVE 09/07/2021 1308   KETONESUR NEGATIVE 09/07/2021 1308   PROTEINUR NEGATIVE 09/07/2021 1308   NITRITE NEGATIVE 09/07/2021 1308   LEUKOCYTESUR NEGATIVE 09/07/2021 1308   Sepsis Labs Recent Labs  Lab 09/09/21 0541 09/09/21 1552 09/10/21 1337 09/11/21 0604  WBC 8.2 7.9 9.4 7.8   Microbiology Recent Results (from the past 240 hour(s))  SARS Coronavirus 2 by RT PCR (hospital order, performed in [facility] hospital lab) *cepheid single result test* Anterior Nasal Swab     Status: None   Collection Time: 09/07/21  4:00 PM   Specimen: Anterior Nasal Swab  Result Value Ref Range Status   SARS Coronavirus 2 by RT PCR NEGATIVE NEGATIVE Final    Comment: (NOTE) SARS-CoV-2 target nucleic acids are NOT DETECTED.  The SARS-CoV-2 RNA is generally detectable in upper and lower respiratory specimens during the acute phase of infection. The lowest concentration of SARS-CoV-2 viral copies this assay can detect is 250 copies / mL. A negative result does not preclude SARS-CoV-2 infection and should not be used as the sole basis for treatment or other patient management  decisions.  A negative result may occur with improper specimen collection / handling, submission of specimen other than nasopharyngeal swab, presence of viral mutation(s) within the areas targeted by this assay, and inadequate number of viral copies (<250 copies / mL). A negative result must be combined with clinical observations, patient history, and epidemiological information.  Fact Sheet for Patients:   https://www.patel.info/  Fact Sheet for Healthcare Providers: https://hall.com/  This test is not yet approved or  cleared by the [place] FDA and has been authorized for detection and/or diagnosis of SARS-CoV-2 by FDA under an Emergency Use Authorization (EUA).  This EUA will remain in effect (meaning this test can be used) for the duration of the COVID-19  declaration under Section 564(b)(1) of the Act, 21 U.S.C. section 360bbb-3(b)(1), unless the authorization is terminated or revoked sooner.  Performed at [facility], [address] [place], [place] [address]      Total time spend on discharging this patient, including the last patient exam, discussing the hospital stay, instructions for ongoing care as it relates to all pertinent caregivers, as well as preparing the medical discharge records, prescriptions, and/or referrals as applicable, is 35 minutes.    Enzo Bi, MD  [facility] 09/11/2021, 8:32 AM

## 2021-09-11 NOTE — Plan of Care (Signed)
  Problem: Education: Goal: Understanding of CV disease, CV risk reduction, and recovery process will improve Outcome: Progressing Goal: Individualized Educational Video(s) Outcome: Progressing   Problem: Activity: Goal: Ability to return to baseline activity level will improve Outcome: Progressing   Problem: Cardiovascular: Goal: Ability to achieve and maintain adequate cardiovascular perfusion will improve Outcome: Progressing Goal: Vascular access site(s) Level 0-1 will be maintained Outcome: Progressing   Problem: Health Behavior/Discharge Planning: Goal: Ability to safely manage health-related needs after discharge will improve Outcome: Progressing   Problem: Clinical Measurements: Goal: Ability to maintain clinical measurements within normal limits will improve Outcome: Progressing Goal: Will remain free from infection Outcome: Progressing Goal: Diagnostic test results will improve Outcome: Progressing Goal: Respiratory complications will improve Outcome: Progressing Goal: Cardiovascular complication will be avoided Outcome: Progressing   Problem: Nutrition: Goal: Adequate nutrition will be maintained Outcome: Progressing   Problem: Coping: Goal: Level of anxiety will decrease Outcome: Progressing   Problem: Elimination: Goal: Will not experience complications related to bowel motility Outcome: Progressing Goal: Will not experience complications related to urinary retention Outcome: Progressing   Problem: Safety: Goal: Ability to remain free from injury will improve Outcome: Progressing

## 2021-09-12 ENCOUNTER — Other Ambulatory Visit: Payer: Self-pay

## 2021-09-12 DIAGNOSIS — Z7982 Long term (current) use of aspirin: Secondary | ICD-10-CM | POA: Insufficient documentation

## 2021-09-12 DIAGNOSIS — X58XXXA Exposure to other specified factors, initial encounter: Secondary | ICD-10-CM | POA: Insufficient documentation

## 2021-09-12 DIAGNOSIS — Z7902 Long term (current) use of antithrombotics/antiplatelets: Secondary | ICD-10-CM | POA: Diagnosis not present

## 2021-09-12 DIAGNOSIS — Z79899 Other long term (current) drug therapy: Secondary | ICD-10-CM | POA: Insufficient documentation

## 2021-09-12 DIAGNOSIS — I2510 Atherosclerotic heart disease of native coronary artery without angina pectoris: Secondary | ICD-10-CM | POA: Diagnosis not present

## 2021-09-12 DIAGNOSIS — S301XXA Contusion of abdominal wall, initial encounter: Secondary | ICD-10-CM | POA: Insufficient documentation

## 2021-09-12 DIAGNOSIS — Z85810 Personal history of malignant neoplasm of tongue: Secondary | ICD-10-CM | POA: Insufficient documentation

## 2021-09-12 DIAGNOSIS — S3991XA Unspecified injury of abdomen, initial encounter: Secondary | ICD-10-CM | POA: Diagnosis present

## 2021-09-12 LAB — CBC WITH DIFFERENTIAL/PLATELET
Abs Immature Granulocytes: 0.05 10*3/uL (ref 0.00–0.07)
Basophils Absolute: 0.1 10*3/uL (ref 0.0–0.1)
Basophils Relative: 1 %
Eosinophils Absolute: 0.4 10*3/uL (ref 0.0–0.5)
Eosinophils Relative: 3 %
HCT: 28.5 % — ABNORMAL LOW (ref 36.0–46.0)
Hemoglobin: 9.7 g/dL — ABNORMAL LOW (ref 12.0–15.0)
Immature Granulocytes: 0 %
Lymphocytes Relative: 17 %
Lymphs Abs: 2 10*3/uL (ref 0.7–4.0)
MCH: 33.7 pg (ref 26.0–34.0)
MCHC: 34 g/dL (ref 30.0–36.0)
MCV: 99 fL (ref 80.0–100.0)
Monocytes Absolute: 1.2 10*3/uL — ABNORMAL HIGH (ref 0.1–1.0)
Monocytes Relative: 11 %
Neutro Abs: 8.1 10*3/uL — ABNORMAL HIGH (ref 1.7–7.7)
Neutrophils Relative %: 68 %
Platelets: 305 10*3/uL (ref 150–400)
RBC: 2.88 MIL/uL — ABNORMAL LOW (ref 3.87–5.11)
RDW: 12.9 % (ref 11.5–15.5)
WBC: 11.7 10*3/uL — ABNORMAL HIGH (ref 4.0–10.5)
nRBC: 0 % (ref 0.0–0.2)

## 2021-09-12 LAB — COMPREHENSIVE METABOLIC PANEL
ALT: 25 U/L (ref 0–44)
AST: 31 U/L (ref 15–41)
Albumin: 4.2 g/dL (ref 3.5–5.0)
Alkaline Phosphatase: 72 U/L (ref 38–126)
Anion gap: 13 (ref 5–15)
BUN: <5 mg/dL — ABNORMAL LOW (ref 6–20)
CO2: 18 mmol/L — ABNORMAL LOW (ref 22–32)
Calcium: 9.2 mg/dL (ref 8.9–10.3)
Chloride: 103 mmol/L (ref 98–111)
Creatinine, Ser: 0.43 mg/dL — ABNORMAL LOW (ref 0.44–1.00)
GFR, Estimated: >60 mL/min (ref >60)
Glucose, Bld: 100 mg/dL — ABNORMAL HIGH (ref 70–99)
Potassium: 3.1 mmol/L — ABNORMAL LOW (ref 3.5–5.1)
Sodium: 134 mmol/L — ABNORMAL LOW (ref 135–145)
Total Bilirubin: 0.8 mg/dL (ref 0.3–1.2)
Total Protein: 7.6 g/dL (ref 6.5–8.1)

## 2021-09-12 NOTE — ED Triage Notes (Signed)
Pt complains of bruising and pain to right groin where cardiologist performed cardiac stent placement on Tuesday. Pt with ecchymosis noted to site.

## 2021-09-13 ENCOUNTER — Emergency Department
Admission: EM | Admit: 2021-09-13 | Discharge: 2021-09-13 | Disposition: A | Discharge status: Home / Self Care | Payer: 59 | Attending: Emergency Medicine | Admitting: Emergency Medicine

## 2021-09-13 ENCOUNTER — Emergency Department: Payer: 59

## 2021-09-13 DIAGNOSIS — I729 Aneurysm of unspecified site: Secondary | ICD-10-CM

## 2021-09-13 DIAGNOSIS — T148XXA Other injury of unspecified body region, initial encounter: Secondary | ICD-10-CM

## 2021-09-13 MED ORDER — OXYCODONE-ACETAMINOPHEN 5-325 MG PO TABS: 2.0000 | ORAL_TABLET | Freq: Four times a day (QID) | ORAL | 0 refills | Status: DC | PRN

## 2021-09-13 MED ORDER — MORPHINE SULFATE (PF) 4 MG/ML IV SOLN
4.0000 mg | Freq: Once | INTRAVENOUS | Status: AC
  Administered 2021-09-13: 4 mg via INTRAVENOUS
  Filled 2021-09-13: qty 1

## 2021-09-13 MED ORDER — ONDANSETRON HCL 4 MG/2ML IJ SOLN
4.0000 mg | Freq: Once | INTRAMUSCULAR | Status: AC
  Administered 2021-09-13: 4 mg via INTRAVENOUS
  Filled 2021-09-13: qty 2

## 2021-09-13 MED ORDER — POTASSIUM CHLORIDE CRYS ER 20 MEQ PO TBCR
40.0000 meq | EXTENDED_RELEASE_TABLET | Freq: Once | ORAL | Status: AC
  Administered 2021-09-13: 40 meq via ORAL
  Filled 2021-09-13: qty 2

## 2021-09-13 MED ORDER — ONDANSETRON 4 MG PO TBDP: 4.0000 mg | ORAL_TABLET | Freq: Four times a day (QID) | ORAL | 0 refills | Status: DC | PRN

## 2021-09-13 NOTE — ED Provider Notes (Signed)
[facility] Provider Note    Event Date/Time   First MD Initiated Contact with Patient 09/13/21 0101     (approximate)   History   bruising to right groin   HPI  Sonya Harmon is a 51 y.o. female with history of CAD who presents to the emergency department with bruising, swelling and discomfort to her right groin.  Patient underwent cardiac catheterization on 09/08/21 and 09/10/2021 and had angioplasty and drug-eluting stent placement to the mid right coronary artery by Dr. Fletcher Anon.  Patient is on dual antiplatelet therapy with aspirin and Plavix.  She states that her foot is tingling.  She states that she did develop a hematoma during the procedure on 09/08/2021.  Per catheterization note the 6 French sheath was occlusive in the right common femoral artery due to significant disease in the right external iliac artery and the sheath had to be removed and hemostasis was obtained with a Mynx grip closure device.  Patient went back to the catheterization lab 2 days later through the right radial artery.  She denies any chest pain or shortness of breath today.  She does also have some discomfort in the left antecubital fossa where peripheral IV was placed.  No bruising, swelling, redness or warmth to this area.  No fever.   History provided by patient.    Past Medical History:  Diagnosis Date   Alcohol abuse    History of tongue cancer    Tobacco abuse     Past Surgical History:  Procedure Laterality Date   BILATERAL SALPINGECTOMY     CORONARY BALLOON ANGIOPLASTY N/A 09/08/2021   Procedure: CORONARY BALLOON ANGIOPLASTY - ABORTED;  Surgeon: Wellington Hampshire, MD;  Location: [place] [facility];  Service: Cardiovascular;  Laterality: N/A;   CORONARY STENT INTERVENTION N/A 09/10/2021   Procedure: CORONARY STENT INTERVENTION;  Surgeon: Wellington Hampshire, MD;  Location: [place] [facility];  Service: Cardiovascular;  Laterality: N/A;   LEFT HEART CATH AND CORONARY  ANGIOGRAPHY N/A 09/08/2021   Procedure: LEFT HEART CATH AND CORONARY ANGIOGRAPHY;  Surgeon: Dionisio David, MD;  Location: [place] [facility];  Service: Cardiovascular;  Laterality: N/A;   TONGUE SURGERY      MEDICATIONS:  Prior to Admission medications   Medication Sig Start Date End Date Taking? Authorizing Provider  aspirin 81 MG chewable tablet Chew 1 tablet (81 mg total) by mouth daily. 09/11/21 09/11/22  Enzo Bi, MD  atorvastatin (LIPITOR) 80 MG tablet Take 1 tablet (80 mg total) by mouth daily. 09/11/21 12/10/21  Enzo Bi, MD  clopidogrel (PLAVIX) 75 MG tablet Take 1 tablet (75 mg total) by mouth daily. 09/11/21 09/11/22  Enzo Bi, MD  ibuprofen (ADVIL) 200 MG tablet Take 400-600 mg by mouth every 6 (six) hours as needed for mild pain or moderate pain.    [provider]  metoprolol tartrate (LOPRESSOR) 25 MG tablet Take 0.5 tablets (12.5 mg total) by mouth 2 (two) times daily. 09/11/21 12/10/21  Enzo Bi, MD  nicotine (NICODERM CQ - DOSED IN MG/24 HOURS) 14 mg/24hr patch Place 1 patch (14 mg total) onto the skin daily as needed (nicotine craving). 09/11/21   Enzo Bi, MD    Physical Exam   Triage Vital Signs: ED Triage Vitals  Enc Vitals Group     BP 09/12/21 2301 (!) 152/85     Pulse Rate 09/12/21 2301 (!) 101     Resp 09/12/21 2301 16     Temp 09/12/21  2301 98.4 F (36.9 C)     Temp Source 09/12/21 2301 Oral     SpO2 09/12/21 2301 100 %     Weight 09/12/21 2302 103 lb 9.9 oz (47 kg)     Height 09/12/21 2302 '5\' 2"'$  (1.575 m)     Head Circumference --      Peak Flow --      Pain Score 09/12/21 2302 8     Pain Loc --      Pain Edu? --      Excl. in [place]? --     Most recent vital signs: Vitals:   09/12/21 2301 09/13/21 0218  BP: (!) 152/85 111/85  Pulse: (!) 101 80  Resp: 16 18  Temp: 98.4 F (36.9 C)   SpO2: 100% 98%    CONSTITUTIONAL: Alert and oriented and responds appropriately to questions.  Appears uncomfortable, tearful HEAD: Normocephalic, atraumatic EYES:  Conjunctivae clear, pupils appear equal, sclera nonicteric ENT: normal nose; moist mucous membranes NECK: Supple, normal ROM CARD: Regular and slightly tachycardic; S1 and S2 appreciated; no murmurs, no clicks, no rubs, no gallops RESP: Normal chest excursion without splinting or tachypnea; breath sounds clear and equal bilaterally; no wheezes, no rhonchi, no rales, no hypoxia or respiratory distress, speaking full sentences ABD/GI: Normal bowel sounds; non-distended; soft, non-tender, no rebound, no guarding, no peritoneal signs; patient has soft tissue swelling and ecchymosis to the right pubic area and femoral area.  It is difficult to assess a pulse in this area due to her level of discomfort.   BACK: The back appears normal EXT: No joint effusion.  Compartments in the right leg soft.  No calf tenderness or calf swelling.  She has a strong palpable 2+ DP pulse.  Normal capillary refill.  Normal sensation in the right leg. SKIN: Normal color for age and race; warm; no rash on exposed skin NEURO: Moves all extremities equally, normal speech PSYCH: The patient's mood and manner are appropriate.   ED Results / Procedures / Treatments   LABS: (all labs ordered are listed, but only abnormal results are displayed) Labs Reviewed  CBC WITH DIFFERENTIAL/PLATELET - Abnormal; Notable for the following components:      Result Value   WBC 11.7 (*)    RBC 2.88 (*)    Hemoglobin 9.7 (*)    HCT 28.5 (*)    Neutro Abs 8.1 (*)    Monocytes Absolute 1.2 (*)    All other components within normal limits  COMPREHENSIVE METABOLIC PANEL - Abnormal; Notable for the following components:   Sodium 134 (*)    Potassium 3.1 (*)    CO2 18 (*)    Glucose, Bld 100 (*)    BUN <5 (*)    Creatinine, Ser 0.43 (*)    All other components within normal limits     EKG:  EKG Interpretation  Date/Time:  Saturday September 13 2021 01:14:25 EDT Ventricular Rate:  90 PR Interval:  146 QRS Duration: 78 QT  Interval:  370 QTC Calculation: 453 R Axis:   58 Text Interpretation: Sinus or ectopic atrial rhythm Abnormal R-wave progression, early transition No significant change since last tracing Reconfirmed by Pryor Curia [phone]) on 09/13/2021 1:39:39 AM         RADIOLOGY: My personal review and interpretation of imaging: Ultrasound shows hematoma but no pseudoaneurysm or fistula.  I have personally reviewed all radiology reports.   [place] Lower Ext Art Right Ltd  Result Date: 09/13/2021 CLINICAL DATA:  Right groin  pain following catheterization EXAM: RIGHT LOWER EXTREMITY LIMITED SOFT TISSUE ULTRASOUND TECHNIQUE: Ultrasound examination of the lower extremity soft tissues was performed in the area of clinical concern. COMPARISON:  None Available. FINDINGS: Grayscale, color Doppler, and duplex sonographic images were obtained of the right common femoral arterial vasculature in the area of reported focal tenderness. The common femoral artery demonstrates a a monophasic arterial waveform suggesting a more proximal hemodynamically significant stenosis or segmental occlusion involving the lower extremity arterial inflow. The common femoral vein demonstrates a normal venous waveform. No arteriovenous fistula or pseudoaneurysm is identified AA 3.7 x 1.0 x 2.5 cm fluid collection is seen superficial to the common femoral vasculature most in keeping with a subcutaneous hematoma. IMPRESSION: 3.7 cm right groin subcutaneous hematoma. No evidence of pseudoaneurysm or arteriovenous fistula. Electronically Signed   By: Fidela Salisbury M.D.   On: 09/13/2021 02:32     PROCEDURES:  Critical Care performed: No     .1-3 Lead EKG Interpretation  Performed by: Farron Lafond, Delice Bison, DO Authorized by: Yao Hyppolite, Delice Bison, DO     Interpretation: abnormal     ECG rate:  101   ECG rate assessment: tachycardic     Rhythm: sinus tachycardia     Ectopy: none     Conduction: normal       IMPRESSION / MDM / ASSESSMENT AND PLAN  / ED COURSE  I reviewed the triage vital signs and the nursing notes.    Patient here with hematoma, discomfort to the right inguinal area after this area was accessed for cardiac catheterization on June 5.  The patient is on the cardiac monitor to evaluate for evidence of arrhythmia and/or significant heart rate changes.   DIFFERENTIAL DIAGNOSIS (includes but not limited to):   Hematoma, pseudoaneurysm, no signs of compartment syndrome, arterial obstruction, DVT.  No signs of cellulitis, abscess, necrotizing fasciitis.   Patient's presentation is most consistent with acute presentation with potential threat to life or bodily function.   PLAN: We will obtain CBC, BMP, arterial ultrasound to rule out pseudoaneurysm.  We will give pain medication.  We will apply ice to this area.  She is neurovascular intact distally.  No signs of infectious etiology, compartment syndrome, DVT on exam.   MEDICATIONS GIVEN IN ED: Medications  potassium chloride SA (KLOR-CON M) CR tablet 40 mEq (has no administration in time range)  morphine (PF) 4 MG/ML injection 4 mg (4 mg Intravenous Given 09/13/21 0144)  ondansetron (ZOFRAN) injection 4 mg (4 mg Intravenous Given 09/13/21 0142)     ED COURSE: Patient's blood work shows anemia which is stable compared to previous.  No thrombocytopenia.  Slightly low potassium level at 3.1 without EKG changes.  Will give oral replacement.  She does have some metabolic acidosis but has normal blood glucose and normal anion gap.  It appears she has had bicarb of 18 four days ago.  Ultrasound reviewed/interpreted by myself radiology and shows hematoma but no fistula or pseudoaneurysm.  She is still neurovascular intact distally without signs of compartment syndrome or DVT.  Again no signs of infectious etiology.  Her hematoma does not appear to be expanding in nature while in the ED.  Have recommended rest, ice to this area but advised that she continue her antiplatelet therapy  given she had a drug-eluting stent placed to her RCA.  She is not having any chest pain or shortness of breath.  She is hemodynamically stable and pain well controlled after morphine.  She did complain of  some left arm pain but she states this is due to her peripheral IV that was placed.  There is no sign of DVT in the upper extremity, thrombophlebitis, cellulitis, abscess.  I feel she is safe for discharge home with close outpatient follow-up.  Discussed supportive care instructions and return precautions.  Will discharge with course of pain medication for analgesia.   At this time, I do not feel there is any life-threatening condition present. I reviewed all nursing notes, vitals, pertinent previous records.  All lab and urine results, EKGs, imaging ordered have been independently reviewed and interpreted by myself.  I reviewed all available radiology reports from any imaging ordered this visit.  Based on my assessment, I feel the patient is safe to be discharged home without further emergent workup and can continue workup as an outpatient as needed. Discussed all findings, treatment plan as well as usual and customary return precautions with patient.  They verbalize understanding and are comfortable with this plan.  Outpatient follow-up has been provided as needed.  All questions have been answered.    CONSULTS: Admission considered but given hematoma is not expanding, patient is hemodynamically stable and neurovascular intact distally without signs of pseudoaneurysm or fistula, will discharge home.   OUTSIDE RECORDS REVIEWED: Reviewed patient's cardiac catheterization notes on 09/08/2021 and 09/10/2021.       FINAL CLINICAL IMPRESSION(S) / ED DIAGNOSES   Final diagnoses:  Hematoma     Rx / DC Orders   ED Discharge Orders          Ordered    oxyCODONE-acetaminophen (PERCOCET) 5-325 MG tablet  Every 6 hours PRN        09/13/21 0252    ondansetron (ZOFRAN-ODT) 4 MG disintegrating tablet   Every 6 hours PRN        09/13/21 0252             Note:  This document was prepared using Dragon voice recognition software and may include unintentional dictation errors.   Taresa [place], Delice Bison, DO 09/13/21 [phone]

## 2021-09-13 NOTE — Discharge Instructions (Addendum)

## 2021-09-16 NOTE — Telephone Encounter (Signed)
Patient scheduled for LE arterial segmental doppler on 09/25/21, and has follow up with Cadence Furth on 09/30/21. Closing encounter.

## 2021-09-17 ENCOUNTER — Other Ambulatory Visit: Payer: Self-pay | Admitting: Cardiovascular Disease

## 2021-09-17 DIAGNOSIS — I739 Peripheral vascular disease, unspecified: Secondary | ICD-10-CM

## 2021-09-22 ENCOUNTER — Telehealth: Payer: Self-pay | Admitting: Cardiovascular Disease

## 2021-09-22 DIAGNOSIS — M7989 Other specified soft tissue disorders: Secondary | ICD-10-CM

## 2021-09-22 NOTE — Telephone Encounter (Signed)
Pt c/o swelling: STAT is pt has developed SOB within 24 hours  If swelling, where is the swelling located? Right leg down to ankle  How much weight have you gained and in what time span?   Have you gained 3 pounds in a day or 5 pounds in a week?   Do you have a log of your daily weights (if so, list)?   Are you currently taking a fluid pill? No  Are you currently SOB? No  Have you traveled recently? No   Pt states that she is also having pain when she walks and would like to know if she will be able to have scheduled appt on 6/22 due to swelling and pain. Please advise

## 2021-09-22 NOTE — Telephone Encounter (Signed)
I spoke with the patient. She advised: - heart stent was placed 2 weeks ago tomorrow - attempted right groin approach, but could not proceed due to PAD (she did develop a hematoma post procedure>> seen in the ER 2 weekends ago and advised Ice & rest).  - right wrist approach was successfully performed  - this weekend, she noticed that her right leg was hurting - she realized she was swelling from the right knee down (could not see her ankle bone) - no left leg swelling - the right leg is not hot/ warm to touch  The patient is scheduled for a LE arterial study on 09/25/21 and wanted to make sure there was nothing she needed to do in the interim for her right leg swelling.  I have advised the patient to elevated her right lower extremity as much as possible. Will forward to Dr. Fletcher Anon for any further recommendations, but other keep her appointment on 09/25/21. ER precautions advised for worsening symptoms in the interim.  The patient voices understanding and is agreeable.

## 2021-09-23 ENCOUNTER — Ambulatory Visit: Payer: 59

## 2021-09-23 DIAGNOSIS — M7989 Other specified soft tissue disorders: Secondary | ICD-10-CM

## 2021-09-23 NOTE — Telephone Encounter (Signed)
Patient's daughter calling back. She is requesting to rule out a DVT with a venous doppler. She states she is very concerned about the patients severe edema on the same side as her stent.

## 2021-09-23 NOTE — Telephone Encounter (Signed)
Patient Sonya Harmon (DVT) venous dopp scheduled today at the [facility] office today @ 11:30 am.  Patient made aware of the appt date, time, and location.

## 2021-09-23 NOTE — Telephone Encounter (Signed)
I agree with a venous Doppler which should be done today.

## 2021-09-25 ENCOUNTER — Other Ambulatory Visit: Payer: Self-pay | Admitting: Cardiovascular Disease

## 2021-09-25 ENCOUNTER — Ambulatory Visit (INDEPENDENT_AMBULATORY_CARE_PROVIDER_SITE_OTHER): Payer: 59

## 2021-09-25 DIAGNOSIS — Z95828 Presence of other vascular implants and grafts: Secondary | ICD-10-CM

## 2021-09-25 DIAGNOSIS — I739 Peripheral vascular disease, unspecified: Secondary | ICD-10-CM | POA: Diagnosis not present

## 2021-09-30 ENCOUNTER — Encounter: Payer: Self-pay | Admitting: Medical

## 2021-09-30 ENCOUNTER — Ambulatory Visit: Payer: 59 | Admitting: Medical

## 2021-09-30 ENCOUNTER — Other Ambulatory Visit
Admission: RE | Admit: 2021-09-30 | Discharge: 2021-09-30 | Disposition: A | Discharge status: Home / Self Care | Payer: 59 | Attending: Medical | Admitting: Medical

## 2021-09-30 VITALS — BP 120/70 | HR 72 | Ht 62.0 in | Wt 102.2 lb

## 2021-09-30 DIAGNOSIS — I739 Peripheral vascular disease, unspecified: Secondary | ICD-10-CM

## 2021-09-30 DIAGNOSIS — E782 Mixed hyperlipidemia: Secondary | ICD-10-CM

## 2021-09-30 DIAGNOSIS — I25111 Atherosclerotic heart disease of native coronary artery with angina pectoris with documented spasm: Secondary | ICD-10-CM

## 2021-09-30 DIAGNOSIS — Z720 Tobacco use: Secondary | ICD-10-CM

## 2021-09-30 LAB — BASIC METABOLIC PANEL
Anion gap: 11 (ref 5–15)
BUN: 10 mg/dL (ref 6–20)
CO2: 24 mmol/L (ref 22–32)
Calcium: 9.7 mg/dL (ref 8.9–10.3)
Chloride: 103 mmol/L (ref 98–111)
Creatinine, Ser: 0.71 mg/dL (ref 0.44–1.00)
GFR, Estimated: >60 mL/min (ref >60)
Glucose, Bld: 95 mg/dL (ref 70–99)
Potassium: 4.3 mmol/L (ref 3.5–5.1)
Sodium: 138 mmol/L (ref 135–145)

## 2021-09-30 LAB — CBC
HCT: 32.6 % — ABNORMAL LOW (ref 36.0–46.0)
Hemoglobin: 10.6 g/dL — ABNORMAL LOW (ref 12.0–15.0)
MCH: 33.2 pg (ref 26.0–34.0)
MCHC: 32.5 g/dL (ref 30.0–36.0)
MCV: 102.2 fL — ABNORMAL HIGH (ref 80.0–100.0)
Platelets: 587 10*3/uL — ABNORMAL HIGH (ref 150–400)
RBC: 3.19 MIL/uL — ABNORMAL LOW (ref 3.87–5.11)
RDW: 15 % (ref 11.5–15.5)
WBC: 9.5 10*3/uL (ref 4.0–10.5)
nRBC: 0 % (ref 0.0–0.2)

## 2021-09-30 MED ORDER — SODIUM CHLORIDE 0.9% FLUSH: 3.0000 mL | Freq: Two times a day (BID) | INTRAVENOUS | Status: DC

## 2021-09-30 NOTE — H&P (View-Only) (Signed)
Cardiology Office Note:    Date:  09/30/2021   ID:  JERZY CROTTEAU, DOB 1970/05/24, MRN [id number]  PCP:  Leonides Sake, MD  [facility] Cardiologist:  None  [facility] Electrophysiologist:  None   Referring MD: No ref. provider found   Chief Complaint: Hospital follow-up  History of Present Illness:    Sonya Harmon is a 51 y.o. female with a hx of PAD, poor healthcare maintenance, tobacco use who is being seen for hospital follow-up.   She was recently admitted for chest pain. Hs trop negative x 2. Heart cath 6/5 showed RCA stenosis, but PCI could not be performed due to PVD with 56F sheath in the right femoral artery occluding the iliac artery. Post-procedure complicated by hematoma and drop in Hgb. The patient was taken back to the cath lab 6/7 and underwent successful PCI to [facility]. Recommenadations for DAPT for at least 6 months. Plan for further PVD management as outpatient.   Follow-up US aorta/IVC/iliac showed significant iliac artery disease b/l. R ABI 1.07 and left ABI 1.09.  Today, the patient reports she has been doing well since being home. She has some burning in her legs and itching. She has some lower right leg edema, it's better at rest. No chest pain or shortness of breath. Not able to walk very far due to leg burning. She is taking DAPT. She is not smoking, has been 3 weeks. She is agreeable to cardiac rehab. She was supposed to go back on the 14th, however cannot stand for too long and she is a Scientist, water quality. She needs apt after July 1st.   Past Medical History:  Diagnosis Date   Alcohol abuse    History of tongue cancer    Tobacco abuse     Past Surgical History:  Procedure Laterality Date   BILATERAL SALPINGECTOMY     CORONARY BALLOON ANGIOPLASTY N/A 09/08/2021   Procedure: CORONARY BALLOON ANGIOPLASTY - ABORTED;  Surgeon: Wellington Hampshire, MD;  Location: [place] [facility];  Service: Cardiovascular;  Laterality: N/A;   CORONARY STENT INTERVENTION N/A  09/10/2021   Procedure: CORONARY STENT INTERVENTION;  Surgeon: Wellington Hampshire, MD;  Location: [place] [facility];  Service: Cardiovascular;  Laterality: N/A;   LEFT HEART CATH AND CORONARY ANGIOGRAPHY N/A 09/08/2021   Procedure: LEFT HEART CATH AND CORONARY ANGIOGRAPHY;  Surgeon: Dionisio David, MD;  Location: [place] [facility];  Service: Cardiovascular;  Laterality: N/A;   TONGUE SURGERY      Current Medications: Current Meds  Medication Sig   aspirin 81 MG chewable tablet Chew 1 tablet (81 mg total) by mouth daily.   atorvastatin (LIPITOR) 80 MG tablet Take 1 tablet (80 mg total) by mouth daily.   clopidogrel (PLAVIX) 75 MG tablet Take 1 tablet (75 mg total) by mouth daily.   ibuprofen (ADVIL) 200 MG tablet Take 400-600 mg by mouth every 6 (six) hours as needed for mild pain or moderate pain.   metoprolol tartrate (LOPRESSOR) 25 MG tablet Take 0.5 tablets (12.5 mg total) by mouth 2 (two) times daily.   ondansetron (ZOFRAN-ODT) 4 MG disintegrating tablet Take 1 tablet (4 mg total) by mouth every 6 (six) hours as needed for nausea or vomiting.   Current Facility-Administered Medications for the 09/30/21 encounter (Office Visit) with Kathlen Mody, Shaquavia Whisonant H, PA-C  Medication   sodium chloride flush (NS) 0.9 % injection 3 mL     Allergies:   Amoxicillin   Social History   Socioeconomic History  Marital status: Married    Spouse name: Not on file   Number of children: Not on file   Years of education: Not on file   Highest education level: Not on file  Occupational History   Not on file  Tobacco Use   Smoking status: Former    Packs/day: 0.50    Types: Cigarettes    Quit date: 09/07/2021    Years since quitting: 0.0   Smokeless tobacco: Current  Substance and Sexual Activity   Alcohol use: Not Currently    Alcohol/week: 42.0 standard drinks of alcohol    Types: 42 Cans of beer per week   Drug use: Not Currently    Types: Marijuana   Sexual activity: Yes    Partners: Male   Other Topics Concern   Not on file  Social History Narrative   ** Merged History Encounter **       Social Determinants of Health   Financial Resource Strain: Not on file  Food Insecurity: Not on file  Transportation Needs: Not on file  Physical Activity: Not on file  Stress: Not on file  Social Connections: Not on file     Family History: The patient's family history includes Aneurysm in her sister; Cancer in her father and mother; Stroke in her sister.  ROS:   Please see the history of present illness.     All other systems reviewed and are negative.  EKGs/Labs/Other Studies Reviewed:    The following studies were reviewed today:  Cardiac cath 09/08/21     Mid RCA lesion is 85% stenosed.   LV end diastolic pressure is normal.   The left ventricular ejection fraction is greater than 65% by visual estimate.   Patient has high-grade lesion in mid RCA with normal coronaries and normal ejection fraction.  Advised PCI with stenting of the RCA.  Advise Brilinta and aspirin.  Procedures  CORONARY BALLOON ANGIOPLASTY - ABORTED   Conclusion  Percutaneous coronary intervention was requested on the right coronary artery by Dr. Humphrey Rolls after diagnostic angiography. However, the 6 French sheath was occlusive in the right common femoral artery due to significant disease in the right external iliac artery.  As such, I had no option but to remove the sheath and achieve hemostasis with a Mynx grip closure device.   We will plan on RCA PCI with Dr. Saunders Revel tomorrow via the right radial artery. The patient has significant leg claudication that can be managed in the outpatient setting.    Cardiac cath 6/7   Prox RCA to Mid RCA lesion is 90% stenosed.   Prox RCA lesion is 30% stenosed.   A drug-eluting stent was successfully placed using a STENT ONYX FRONTIER [id number].   Post intervention, there is a 0% residual stenosis.   Successful angioplasty and drug-eluting stent placement to the mid  right coronary artery.   Recommendations: Dual antiplatelet therapy for at least 6 months. Smoking cessation. Aggressive treatment of risk factors. The patient can be discharged home tomorrow morning.  She can follow-up with me for management of peripheral arterial disease as an outpatient.    EKG:  EKG is not ordered today.    Recent Labs: 09/07/2021: B Natriuretic Peptide 30.4 09/08/2021: TSH 4.241 09/11/2021: Magnesium 2.0 09/12/2021: ALT 25 09/30/2021: BUN 10; Creatinine, Ser 0.71; Hemoglobin 10.6; Platelets 587; Potassium 4.3; Sodium 138  Recent Lipid Panel    Component Value Date/Time   CHOL 177 09/09/2021 0541   TRIG 30 09/09/2021 0541   HDL 95  09/09/2021 0541   CHOLHDL 1.9 09/09/2021 0541   VLDL 6 09/09/2021 0541   LDLCALC 76 09/09/2021 0541    Physical Exam:    VS:  BP 120/70 (BP Location: Left Arm, Patient Position: Sitting, Cuff Size: Normal)   Pulse 72   Ht '5\' 2"'$  (1.575 m)   Wt 102 lb 4 oz (46.4 kg)   SpO2 99%   BMI 18.70 kg/m     Wt Readings from Last 3 Encounters:  09/30/21 102 lb 4 oz (46.4 kg)  09/12/21 103 lb 9.9 oz (47 kg)  09/10/21 105 lb (47.6 kg)     GEN:  Well nourished, well developed in no acute distress HEENT: Normal NECK: No JVD; No carotid bruits LYMPHATICS: No lymphadenopathy CARDIAC: RRR, no murmurs, rubs, gallops RESPIRATORY:  Clear to auscultation without rales, wheezing or rhonchi  ABDOMEN: Soft, non-tender, non-distended MUSCULOSKELETAL:  No edema; No deformity  SKIN: Warm and dry NEUROLOGIC:  Alert and oriented x 3 PSYCHIATRIC:  Normal affect   ASSESSMENT:    1. Coronary artery disease involving native coronary artery of native heart with angina pectoris with documented spasm ([place])   2. PAD (peripheral artery disease) ([place])   3. Hyperlipidemia, mixed   4. Tobacco use    PLAN:    In order of problems listed above:  CAD s/p DES RCA 09/10/21 She denies chest pain or SOB. Cath site, right groin and radial appear stable. LHC showed  normal LVEF. She reports compliance with DAPT. I will refer her to cardiac rehab, this may be limited by PAD (plan as below). Continue Aspirin, Plavix, statin and BB therapy. She is unsure if she will follow with [place] or Dr. Chancy Milroy.   PAD Patient reports persistent leg burning. Follow-up US aorta/iliac/IVC showed significant iliac artery disease b/l. We will set her up for lower extremity angiography with Dr. Fletcher Anon.   Tobacco use She quit smoking 3 weeks ago. She was congratulated.   HLD LDL 76, goal<70. Continue Lipitor '80mg'$  daily. Can re-check at follow-up  Disposition: Follow up in 3 week(s) with MD     Signed, Melaina Howerton Ninfa Meeker, PA-C  09/30/2021 12:04 PM    [place]

## 2021-09-30 NOTE — Progress Notes (Signed)
Cardiology Office Note:    Date:  09/30/2021   ID:  JERZY CROTTEAU, DOB 1970/05/24, MRN [id number]  PCP:  Leonides Sake, MD  [facility] Cardiologist:  None  [facility] Electrophysiologist:  None   Referring MD: No ref. provider found   Chief Complaint: Hospital follow-up  History of Present Illness:    Sonya Harmon is a 51 y.o. female with a hx of PAD, poor healthcare maintenance, tobacco use who is being seen for hospital follow-up.   She was recently admitted for chest pain. Hs trop negative x 2. Heart cath 6/5 showed RCA stenosis, but PCI could not be performed due to PVD with 56F sheath in the right femoral artery occluding the iliac artery. Post-procedure complicated by hematoma and drop in Hgb. The patient was taken back to the cath lab 6/7 and underwent successful PCI to [facility]. Recommenadations for DAPT for at least 6 months. Plan for further PVD management as outpatient.   Follow-up US aorta/IVC/iliac showed significant iliac artery disease b/l. R ABI 1.07 and left ABI 1.09.  Today, the patient reports she has been doing well since being home. She has some burning in her legs and itching. She has some lower right leg edema, it's better at rest. No chest pain or shortness of breath. Not able to walk very far due to leg burning. She is taking DAPT. She is not smoking, has been 3 weeks. She is agreeable to cardiac rehab. She was supposed to go back on the 14th, however cannot stand for too long and she is a Scientist, water quality. She needs apt after July 1st.   Past Medical History:  Diagnosis Date   Alcohol abuse    History of tongue cancer    Tobacco abuse     Past Surgical History:  Procedure Laterality Date   BILATERAL SALPINGECTOMY     CORONARY BALLOON ANGIOPLASTY N/A 09/08/2021   Procedure: CORONARY BALLOON ANGIOPLASTY - ABORTED;  Surgeon: Wellington Hampshire, MD;  Location: [place] [facility];  Service: Cardiovascular;  Laterality: N/A;   CORONARY STENT INTERVENTION N/A  09/10/2021   Procedure: CORONARY STENT INTERVENTION;  Surgeon: Wellington Hampshire, MD;  Location: [place] [facility];  Service: Cardiovascular;  Laterality: N/A;   LEFT HEART CATH AND CORONARY ANGIOGRAPHY N/A 09/08/2021   Procedure: LEFT HEART CATH AND CORONARY ANGIOGRAPHY;  Surgeon: Dionisio David, MD;  Location: [place] [facility];  Service: Cardiovascular;  Laterality: N/A;   TONGUE SURGERY      Current Medications: Current Meds  Medication Sig   aspirin 81 MG chewable tablet Chew 1 tablet (81 mg total) by mouth daily.   atorvastatin (LIPITOR) 80 MG tablet Take 1 tablet (80 mg total) by mouth daily.   clopidogrel (PLAVIX) 75 MG tablet Take 1 tablet (75 mg total) by mouth daily.   ibuprofen (ADVIL) 200 MG tablet Take 400-600 mg by mouth every 6 (six) hours as needed for mild pain or moderate pain.   metoprolol tartrate (LOPRESSOR) 25 MG tablet Take 0.5 tablets (12.5 mg total) by mouth 2 (two) times daily.   ondansetron (ZOFRAN-ODT) 4 MG disintegrating tablet Take 1 tablet (4 mg total) by mouth every 6 (six) hours as needed for nausea or vomiting.   Current Facility-Administered Medications for the 09/30/21 encounter (Office Visit) with Sonya Harmon, Sonya Whisonant H, PA-C  Medication   sodium chloride flush (NS) 0.9 % injection 3 mL     Allergies:   Amoxicillin   Social History   Socioeconomic History  Marital status: Married    Spouse name: Not on file   Number of children: Not on file   Years of education: Not on file   Highest education level: Not on file  Occupational History   Not on file  Tobacco Use   Smoking status: Former    Packs/day: 0.50    Types: Cigarettes    Quit date: 09/07/2021    Years since quitting: 0.0   Smokeless tobacco: Current  Substance and Sexual Activity   Alcohol use: Not Currently    Alcohol/week: 42.0 standard drinks of alcohol    Types: 42 Cans of beer per week   Drug use: Not Currently    Types: Marijuana   Sexual activity: Yes    Partners: Male   Other Topics Concern   Not on file  Social History Narrative   ** Merged History Encounter **       Social Determinants of Health   Financial Resource Strain: Not on file  Food Insecurity: Not on file  Transportation Needs: Not on file  Physical Activity: Not on file  Stress: Not on file  Social Connections: Not on file     Family History: The patient's family history includes Aneurysm in her sister; Cancer in her father and mother; Stroke in her sister.  ROS:   Please see the history of present illness.     All other systems reviewed and are negative.  EKGs/Labs/Other Studies Reviewed:    The following studies were reviewed today:  Cardiac cath 09/08/21     Mid RCA lesion is 85% stenosed.   LV end diastolic pressure is normal.   The left ventricular ejection fraction is greater than 65% by visual estimate.   Patient has high-grade lesion in mid RCA with normal coronaries and normal ejection fraction.  Advised PCI with stenting of the RCA.  Advise Brilinta and aspirin.  Procedures  CORONARY BALLOON ANGIOPLASTY - ABORTED   Conclusion  Percutaneous coronary intervention was requested on the right coronary artery by Dr. Humphrey Rolls after diagnostic angiography. However, the 6 French sheath was occlusive in the right common femoral artery due to significant disease in the right external iliac artery.  As such, I had no option but to remove the sheath and achieve hemostasis with a Mynx grip closure device.   We will plan on RCA PCI with Dr. Saunders Revel tomorrow via the right radial artery. The patient has significant leg claudication that can be managed in the outpatient setting.    Cardiac cath 6/7   Prox RCA to Mid RCA lesion is 90% stenosed.   Prox RCA lesion is 30% stenosed.   A drug-eluting stent was successfully placed using a STENT ONYX FRONTIER [id number].   Post intervention, there is a 0% residual stenosis.   Successful angioplasty and drug-eluting stent placement to the mid  right coronary artery.   Recommendations: Dual antiplatelet therapy for at least 6 months. Smoking cessation. Aggressive treatment of risk factors. The patient can be discharged home tomorrow morning.  She can follow-up with me for management of peripheral arterial disease as an outpatient.    EKG:  EKG is not ordered today.    Recent Labs: 09/07/2021: B Natriuretic Peptide 30.4 09/08/2021: TSH 4.241 09/11/2021: Magnesium 2.0 09/12/2021: ALT 25 09/30/2021: BUN 10; Creatinine, Ser 0.71; Hemoglobin 10.6; Platelets 587; Potassium 4.3; Sodium 138  Recent Lipid Panel    Component Value Date/Time   CHOL 177 09/09/2021 0541   TRIG 30 09/09/2021 0541   HDL 95  09/09/2021 0541   CHOLHDL 1.9 09/09/2021 0541   VLDL 6 09/09/2021 0541   LDLCALC 76 09/09/2021 0541    Physical Exam:    VS:  BP 120/70 (BP Location: Left Arm, Patient Position: Sitting, Cuff Size: Normal)   Pulse 72   Ht '5\' 2"'$  (1.575 m)   Wt 102 lb 4 oz (46.4 kg)   SpO2 99%   BMI 18.70 kg/m     Wt Readings from Last 3 Encounters:  09/30/21 102 lb 4 oz (46.4 kg)  09/12/21 103 lb 9.9 oz (47 kg)  09/10/21 105 lb (47.6 kg)     GEN:  Well nourished, well developed in no acute distress HEENT: Normal NECK: No JVD; No carotid bruits LYMPHATICS: No lymphadenopathy CARDIAC: RRR, no murmurs, rubs, gallops RESPIRATORY:  Clear to auscultation without rales, wheezing or rhonchi  ABDOMEN: Soft, non-tender, non-distended MUSCULOSKELETAL:  No edema; No deformity  SKIN: Warm and dry NEUROLOGIC:  Alert and oriented x 3 PSYCHIATRIC:  Normal affect   ASSESSMENT:    1. Coronary artery disease involving native coronary artery of native heart with angina pectoris with documented spasm ([place])   2. PAD (peripheral artery disease) ([place])   3. Hyperlipidemia, mixed   4. Tobacco use    PLAN:    In order of problems listed above:  CAD s/p DES RCA 09/10/21 She denies chest pain or SOB. Cath site, right groin and radial appear stable. LHC showed  normal LVEF. She reports compliance with DAPT. I will refer her to cardiac rehab, this may be limited by PAD (plan as below). Continue Aspirin, Plavix, statin and BB therapy. She is unsure if she will follow with [place] or Dr. Chancy Milroy.   PAD Patient reports persistent leg burning. Follow-up US aorta/iliac/IVC showed significant iliac artery disease b/l. We will set her up for lower extremity angiography with Dr. Fletcher Anon.   Tobacco use She quit smoking 3 weeks ago. She was congratulated.   HLD LDL 76, goal<70. Continue Lipitor '80mg'$  daily. Can re-check at follow-up  Disposition: Follow up in 3 week(s) with MD     Signed, Melaina Howerton Ninfa Meeker, PA-C  09/30/2021 12:04 PM    [place]

## 2021-10-10 ENCOUNTER — Encounter: Payer: Self-pay | Admitting: Emergency Medicine

## 2021-10-10 ENCOUNTER — Ambulatory Visit: Payer: Commercial Managed Care - HMO | Admitting: Internal Medicine

## 2021-10-10 ENCOUNTER — Emergency Department
Admission: EM | Admit: 2021-10-10 | Discharge: 2021-10-11 | Disposition: A | Discharge status: Home / Self Care | Payer: Commercial Managed Care - HMO | Attending: Emergency Medicine | Admitting: Emergency Medicine

## 2021-10-10 ENCOUNTER — Other Ambulatory Visit: Payer: Self-pay

## 2021-10-10 ENCOUNTER — Emergency Department: Payer: Commercial Managed Care - HMO

## 2021-10-10 ENCOUNTER — Encounter: Payer: Self-pay | Admitting: Internal Medicine

## 2021-10-10 ENCOUNTER — Telehealth: Payer: Self-pay | Admitting: Cardiovascular Disease

## 2021-10-10 VITALS — BP 123/75 | HR 105 | Ht 62.0 in | Wt 103.0 lb

## 2021-10-10 DIAGNOSIS — I739 Peripheral vascular disease, unspecified: Secondary | ICD-10-CM

## 2021-10-10 DIAGNOSIS — I2510 Atherosclerotic heart disease of native coronary artery without angina pectoris: Secondary | ICD-10-CM | POA: Diagnosis not present

## 2021-10-10 DIAGNOSIS — E785 Hyperlipidemia, unspecified: Secondary | ICD-10-CM

## 2021-10-10 DIAGNOSIS — R55 Syncope and collapse: Secondary | ICD-10-CM | POA: Diagnosis not present

## 2021-10-10 DIAGNOSIS — F17200 Nicotine dependence, unspecified, uncomplicated: Secondary | ICD-10-CM | POA: Diagnosis not present

## 2021-10-10 DIAGNOSIS — Z85810 Personal history of malignant neoplasm of tongue: Secondary | ICD-10-CM | POA: Diagnosis not present

## 2021-10-10 DIAGNOSIS — M7981 Nontraumatic hematoma of soft tissue: Secondary | ICD-10-CM | POA: Insufficient documentation

## 2021-10-10 LAB — CBC
HCT: 32.3 % — ABNORMAL LOW (ref 36.0–46.0)
Hemoglobin: 10.7 g/dL — ABNORMAL LOW (ref 12.0–15.0)
MCH: 32.8 pg (ref 26.0–34.0)
MCHC: 33.1 g/dL (ref 30.0–36.0)
MCV: 99.1 fL (ref 80.0–100.0)
Platelets: 414 10*3/uL — ABNORMAL HIGH (ref 150–400)
RBC: 3.26 MIL/uL — ABNORMAL LOW (ref 3.87–5.11)
RDW: 13.9 % (ref 11.5–15.5)
WBC: 7.9 10*3/uL (ref 4.0–10.5)
nRBC: 0 % (ref 0.0–0.2)

## 2021-10-10 LAB — BASIC METABOLIC PANEL
Anion gap: 13 (ref 5–15)
BUN: 10 mg/dL (ref 6–20)
CO2: 19 mmol/L — ABNORMAL LOW (ref 22–32)
Calcium: 9.3 mg/dL (ref 8.9–10.3)
Chloride: 101 mmol/L (ref 98–111)
Creatinine, Ser: 0.6 mg/dL (ref 0.44–1.00)
GFR, Estimated: >60 mL/min (ref >60)
Glucose, Bld: 74 mg/dL (ref 70–99)
Potassium: 4.7 mmol/L (ref 3.5–5.1)
Sodium: 133 mmol/L — ABNORMAL LOW (ref 135–145)

## 2021-10-10 LAB — TROPONIN I (HIGH SENSITIVITY)
Troponin I (High Sensitivity): 6 ng/L (ref <18)
Troponin I (High Sensitivity): 7 ng/L (ref <18)

## 2021-10-10 LAB — D-DIMER, QUANTITATIVE: D-Dimer, Quant: 0.48 ug/mL-FEU (ref 0.00–0.50)

## 2021-10-10 MED ORDER — ONDANSETRON 4 MG PO TBDP
4.0000 mg | ORAL_TABLET | Freq: Once | ORAL | Status: AC
  Administered 2021-10-10: 4 mg via ORAL
  Filled 2021-10-10: qty 1

## 2021-10-10 MED ORDER — SODIUM CHLORIDE 0.9 % IV BOLUS
1000.0000 mL | Freq: Once | INTRAVENOUS | Status: AC
  Administered 2021-10-10: 1000 mL via INTRAVENOUS

## 2021-10-10 NOTE — Telephone Encounter (Signed)
Called patient and discussed the previous documentation with her. I was able to offer her a DOD slot this afternoon with Dr. Saunders Revel at 3:20. Patient was very grateful for the accomodation. I also advised her to bring her BP cuff in with her so that we could check it against ours and make sure that it is accurate. Patient verbalized understanding and agreed with plan.

## 2021-10-10 NOTE — Discharge Instructions (Addendum)
Your blood work-up today including your cardiac enzymes was all reassuring.  Please make sure you are drinking enough fluid.  If you have any worsening chest pain return to the emergency department.  Otherwise please follow-up with Dr. Saunders Revel.

## 2021-10-10 NOTE — Telephone Encounter (Signed)
.  Pt c/o BP issue: STAT if pt c/o blurred vision, one-sided weakness or slurred speech  1. What are your last 5 BP readings? Blood pressure been low all week earlier today ity was 128/83 and now it 161/99, these are readings from this week,  93/66, 64/43, 59/39, 60/38. And 96/63  2. Are you having any other symptoms (ex. Dizziness, headache, blurred vision, passed out)? Lightheaded and weak- could hardly stand up all week- one time everything was completely black, she had to sit on the floor   3. What is your BP issue? Blood pressure been running low all week, except today.- this morning, woke up drenched in sweat, just did not feel well at all- noticed a big red sp;ot on ther inside of her right leg

## 2021-10-10 NOTE — ED Triage Notes (Signed)
Pt here from cardiology where she went due to bruise on right shin. Pt has felt dizzy and near syncopal for about 4 days. Pt on plavix. Endorses SOB with exertion.

## 2021-10-10 NOTE — ED Provider Notes (Signed)
[facility] Provider Note    Event Date/Time   First MD Initiated Contact with Patient 10/10/21 2144     (approximate)   History   Near Syncope   HPI  Sonya Harmon is a 51 y.o. female past medical history of coronary disease status post PCI to mid RCA in the setting of unstable angina, peripheral artery disease, tongue cancer tobacco use who presents with lightheadedness.  Patient endorses feeling lightheaded sweaty and clammy today.  Has been intermittently feeling lightheaded over the past week she was standing up at the time sat down and felt improved and slept on the floor.  She noticed bruising on the right lower extremity today but denies trauma no other bleeding denies fevers chills nausea vomiting.  She is eating but says she does not drink very much typically.  She followed up in cardiology clinic today and EKG was concerning for possible inferior ST elevations although Dr. Saunders Revel and Dr. Fletcher Anon did not think that it met STEMI criteria.  Patient not having symptoms of angina at that time also but was transferred to the emergency department for further evaluation.  Patient says she was generally feeling well until today.     Past Medical History:  Diagnosis Date  . Alcohol abuse   . Coronary artery disease 09/2021   s/p PCI to RCA in setting of unstable angina  . History of tongue cancer   . PAD (peripheral artery disease) ([place])   . Tobacco abuse     Patient Active Problem List   Diagnosis Date Noted  . Near syncope 10/10/2021  . Hyperlipidemia LDL goal <70 10/10/2021  . Coronary artery disease involving native coronary artery of native heart without angina pectoris   . Hyponatremia 09/09/2021  . Hypoglycemia 09/09/2021  . Coronary artery disease involving native coronary artery of native heart with angina pectoris with documented spasm ([place]) 09/08/2021  . Metabolic acidosis 92/33/0076  . Superior mesenteric artery stenosis ([place]) 09/08/2021  .  PAD (peripheral artery disease) ([place]) 09/08/2021  . Unstable angina ([place])   . Abdominal pain 09/07/2021  . Tobacco dependence 09/07/2021  . Leg pain, bilateral 09/07/2021  . Alcohol abuse 09/07/2021  . Hepatic steatosis 09/07/2021  . Does not have primary care provider 09/07/2021     Physical Exam  Triage Vital Signs: ED Triage Vitals  Enc Vitals Group     BP 10/10/21 1610 (!) 132/92     Pulse Rate 10/10/21 1610 68     Resp 10/10/21 1610 18     Temp 10/10/21 1610 98.5 F (36.9 C)     Temp Source 10/10/21 1610 Oral     SpO2 10/10/21 1610 100 %     Weight 10/10/21 1611 103 lb (46.7 kg)     Height 10/10/21 1611 $RemoveBefor'5\' 2"'[id number]$  (1.575 m)     Head Circumference --      Peak Flow --      Pain Score 10/10/21 1611 0     Pain Loc --      Pain Edu? --      Excl. in [place]? --     Most recent vital signs: Vitals:   10/10/21 1957 10/10/21 2200  BP: 127/80 121/74  Pulse: 61 (!) 54  Resp: 16 12  Temp:    SpO2: 100% 100%     General: Awake, no distress.  CV:  Good peripheral perfusion.  Resp:  Normal effort.  Abd:  No distention.  Neuro:  Awake, Alert, Oriented x 3  Other:  Dry MM   ED Results / Procedures / Treatments  Labs (all labs ordered are listed, but only abnormal results are displayed) Labs Reviewed  BASIC METABOLIC PANEL - Abnormal; Notable for the following components:      Result Value   Sodium 133 (*)    CO2 19 (*)    All other components within normal limits  CBC - Abnormal; Notable for the following components:   RBC 3.26 (*)    Hemoglobin 10.7 (*)    HCT 32.3 (*)    Platelets 414 (*)    All other components within normal limits  D-DIMER, QUANTITATIVE  POC URINE PREG, ED  POC URINE PREG, ED  TROPONIN I (HIGH SENSITIVITY)  TROPONIN I (HIGH SENSITIVITY)     EKG EKG reviewed and interpreted by myself shows nonspecific ST elevation in inferior leads T wave inversions in 1 and aVL inferior Q waves looks similar morphology to prior  EKG    RADIOLOGY    PROCEDURES:  Critical Care performed: No  .1-3 Lead EKG Interpretation  Performed by: Rada Hay, MD Authorized by: Rada Hay, MD     Interpretation: normal     ECG rate assessment: normal     Rhythm: sinus rhythm     Ectopy: none     Conduction: normal     The patient is on the cardiac monitor to evaluate for evidence of arrhythmia and/or significant heart rate changes.   MEDICATIONS ORDERED IN ED: Medications  ondansetron (ZOFRAN-ODT) disintegrating tablet 4 mg (4 mg Oral Given 10/10/21 2006)     IMPRESSION / MDM / ASSESSMENT AND PLAN / ED COURSE  I reviewed the triage vital signs and the nursing notes.                              Patient's presentation is most consistent with acute presentation with potential threat to life or bodily function.  Differential diagnosis includes, but is not limited to, dehydration causing hypovolemia, anemia, ACS, electrolyte abnormality, AKI  Patient is a 51 year old female with recent RCA PCI due to unstable angina presents from cardiology clinic with lightheadedness and abnormal EKG.  Patient has been feeling presyncopal for the last week.  Initially she denies chest pain but later describes some intermittent sensation of chest tightness and fluttering that is very transient nonexertional and atypical.  Dr. Saunders Revel notes in his note today that she does not have any frank anginal symptoms.  Her EKG does have ST elevation in a con cave pattern with some J-point elevation in inferior leads T wave inversions in 1 and aVL this does appear similar in morphology although somewhat more pronounced from prior EKG from a month ago.  Troponins x2 are negative D-dimer is negative labs overall reassuring.  I have given her a liter of fluid given her presyncopal symptoms although blood pressure has been normal here.  I discussed with Dr. Aundra Dubin cardiologist on-call and he agrees patient is appropriate for discharge given  not having ischemic symptoms and troponins are negative.  We will have her follow-up with Dr. Saunders Revel in cardiology clinic.  We discussed return precautions.  Discussed maintaining good hydration.     FINAL CLINICAL IMPRESSION(S) / ED DIAGNOSES   Final diagnoses:  None     Rx / DC Orders   ED Discharge Orders     None  Note:  This document was prepared using Dragon voice recognition software and may include unintentional dictation errors. 

## 2021-10-10 NOTE — ED Provider Triage Note (Signed)
Emergency Medicine Provider Triage Evaluation Note  Sonya Harmon, a 51 y.o. female  was evaluated in triage.  Pt complains of intermittent chest pain, dizziness, and bruising.  Patient reports episode last 4 days.  Patient presents to the ED from her cardiologist office for evaluation of her symptoms.  Patient is on Plavix secondary to cardiac stent placement.  Review of Systems  Positive: Chest pain, bruising, dizziness Negative: Fever chills sweats  Physical Exam  BP (!) 132/92 (BP Location: Right Arm)   Pulse 68   Temp 98.5 F (36.9 C) (Oral)   Resp 18   Ht '5\' 2"'$  (1.575 m)   Wt 46.7 kg   SpO2 100%   BMI 18.84 kg/m  Gen:   Awake, no distress   Resp:  Normal effort  MSK:   Moves extremities without difficulty  CVS:  RRR  Medical Decision Making  Medically screening exam initiated at 4:16 PM.  Appropriate orders placed.  Sonya Harmon was informed that the remainder of the evaluation will be completed by another provider, this initial triage assessment does not replace that evaluation, and the importance of remaining in the ED until their evaluation is complete.  Patient to the ED from her cardiology office for evaluation of chest pain and dizziness.   Melvenia Needles, PA-C 10/10/21 1617

## 2021-10-10 NOTE — Progress Notes (Signed)
Follow-up Outpatient Visit Date: 10/10/2021  Primary Care Provider: Leonides Sake, MD [place] [address]  Chief Complaint: Dizziness  HPI:  Sonya Harmon is a 51 y.o. female with history of coronary artery disease status post PCI to the mid RCA in the setting of unstable angina, PAD, tongue cancer, and tobacco/alcohol abuse, who presents for urgent evaluation of low blood pressure and near syncope.  She underwent PCI to the mid RCA on 09/10/2021 for unstable angina.  Her hospital course was complicated by groin hematoma.  She was also incidentally noted to have severe right iliofemoral disease.  She was seen in follow-up on 09/30/2021, which time she was feeling fairly well.  Vascular studies were performed, with plans for abdominal aortogram with Dr. Sophronia Simas next week.  Ms. Bachmeier reached out to our office today complaining of lightheadedness over the last week.  She notes that her home blood pressures have typically been in the 90s over 60s.  This morning, they were even lower at times.  She felt very lightheaded, clammy, and drenched in sweat this morning.  She got very dizzy when she stood up and had to lie down on the floor.  Yesterday, she noticed blackening of her vision while cooking and had to sit down to avoid passing out completely.  She has been taking her medications as prescribed, including DAPT.  She has not had any frank bleeding but noticed a new bruise on her right calf today.  She denies any trauma.  She has not had any frank chest pain but has a vague uneasy feeling in her chest at times.  She describes it almost as if her heart is beating fast.  At the time of her recent hospitalization she was also without chest pain but was experiencing epigastric and left flank pain.  She has not had any further discomfort in this area.  She notes exertional dyspnea when walking up a hill.  She has not had any edema.  She had some swelling in her right ankle a week or 2 ago, though  this has since resolved.  --------------------------------------------------------------------------------------------------  Past Medical History:  Diagnosis Date   Alcohol abuse    Coronary artery disease 09/2021   s/p PCI to RCA in setting of unstable angina   History of tongue cancer    PAD (peripheral artery disease) (HCC)    Tobacco abuse    Past Surgical History:  Procedure Laterality Date   BILATERAL SALPINGECTOMY     CORONARY BALLOON ANGIOPLASTY N/A 09/08/2021   Procedure: CORONARY BALLOON ANGIOPLASTY - ABORTED;  Surgeon: Wellington Hampshire, MD;  Location: [facility];  Service: Cardiovascular;  Laterality: N/A;   CORONARY STENT INTERVENTION N/A 09/10/2021   Procedure: CORONARY STENT INTERVENTION;  Surgeon: Wellington Hampshire, MD;  Location: [facility];  Service: Cardiovascular;  Laterality: N/A;   LEFT HEART CATH AND CORONARY ANGIOGRAPHY N/A 09/08/2021   Procedure: LEFT HEART CATH AND CORONARY ANGIOGRAPHY;  Surgeon: Dionisio David, MD;  Location: [facility];  Service: Cardiovascular;  Laterality: N/A;   TONGUE SURGERY       Recent CV Pertinent Labs: Lab Results  Component Value Date   CHOL 177 09/09/2021   HDL 95 09/09/2021   LDLCALC 76 09/09/2021   TRIG 30 09/09/2021   CHOLHDL 1.9 09/09/2021   INR 1.0 09/08/2021   BNP 30.4 09/07/2021   K 4.3 09/30/2021   MG 2.0 09/11/2021   BUN 10 09/30/2021  CREATININE 0.71 09/30/2021    Past medical and surgical history were reviewed and updated in EPIC.  Current Meds  Medication Sig   aspirin 81 MG chewable tablet Chew 1 tablet (81 mg total) by mouth daily.   atorvastatin (LIPITOR) 80 MG tablet Take 1 tablet (80 mg total) by mouth daily.   clopidogrel (PLAVIX) 75 MG tablet Take 1 tablet (75 mg total) by mouth daily.   ibuprofen (ADVIL) 200 MG tablet Take 400 mg by mouth every 6 (six) hours as needed for mild pain, moderate pain, fever or headache.   metoprolol tartrate (LOPRESSOR) 25 MG tablet  Take 0.5 tablets (12.5 mg total) by mouth 2 (two) times daily.    Allergies: Amoxicil &clarithro &lansopraz and Amoxicillin  Social History   Tobacco Use   Smoking status: Former    Packs/day: 0.50    Types: Cigarettes    Quit date: 09/07/2021    Years since quitting: 0.0   Smokeless tobacco: Former  Scientific laboratory technician Use: Former  Substance Use Topics   Alcohol use: Not Currently    Alcohol/week: 42.0 standard drinks of alcohol    Types: 42 Cans of beer per week   Drug use: Not Currently    Types: Marijuana    Family History  Problem Relation Age of Onset   Cancer Mother    Cancer Father    Stroke Sister    Aneurysm Sister     Review of Systems: A 12-system review of systems was performed and was negative except as noted in the HPI.  --------------------------------------------------------------------------------------------------  Physical Exam: BP 123/75 (BP Location: Left Arm, Patient Position: Sitting, Cuff Size: Normal)   Pulse (!) 105   Ht '5\' 2"'$  (1.575 m)   Wt 103 lb (46.7 kg)   SpO2 99%   BMI 18.84 kg/m   General:  NAD. Neck: No JVD or HJR. Lungs: Clear to auscultation bilaterally without wheezes or crackles. Heart: Regular rate and rhythm without murmurs, rubs, or gallops. Abdomen: Soft, nontender, nondistended. Extremities: No lower extremity edema.  EKG: Sinus tachycardia with nonspecific ST segment changes and QT prolongation.  Subtle ST elevation in the inferior leads is most consistent with early repolarization and is similar to the tracing from 09/07/2021.  Lab Results  Component Value Date   WBC 9.5 09/30/2021   HGB 10.6 (L) 09/30/2021   HCT 32.6 (L) 09/30/2021   MCV 102.2 (H) 09/30/2021   PLT 587 (H) 09/30/2021    Lab Results  Component Value Date   NA 138 09/30/2021   K 4.3 09/30/2021   CL 103 09/30/2021   CO2 24 09/30/2021   BUN 10 09/30/2021   CREATININE 0.71 09/30/2021   GLUCOSE 95 09/30/2021   ALT 25 09/12/2021    Lab  Results  Component Value Date   CHOL 177 09/09/2021   HDL 95 09/09/2021   LDLCALC 76 09/09/2021   TRIG 30 09/09/2021   CHOLHDL 1.9 09/09/2021    --------------------------------------------------------------------------------------------------  ASSESSMENT AND PLAN: Coronary artery disease: Ms. Nored has not had any frank angina since her recent hospitalization.  Computer interpretation of today's EKG is inferior STEMI, though I do not believe the tracing is diagnostic for STEMI.  I have reviewed the EKG with Dr. Fletcher Anon, who is also in agreement.  Ms. Salas has been compliant with aspirin and clopidogrel, which she should continue.  As detailed below, we will transport her to the emergency department for further evaluation.  Though her constellation of symptoms is not consistent  with ACS, I think it would be prudent to trend high-sensitivity troponin I in the setting of recent PCI and near syncope (she also had atypical symptoms during her last hospitalization).  Near syncope: Ms. Eagleton has had several syncopal episodes in the setting of hypotension.  Orthostatic vital signs today do not show significant blood pressure drop though vital signs were positive based on heart rate.  Only potentially blood pressure lowering medication is low-dose metoprolol.  Given her recent prolonged hospitalization, she is at risk for PE.  I have recommended that she be taken to the emergency department for further evaluation.  It would be worthwhile to check a D-dimer and have a low threshold for CTA of the chest to exclude PE.  She may also benefit from IV fluids if there is evidence of dehydration.  PAD: Patient scheduled for abdominal aortogram and runoff with Dr. Sophronia Simas next week.  Hyperlipidemia: Continue atorvastatin.  Follow-up: To be determined based on results of ED work-up.  Nelva Bush, MD 10/10/2021 3:52 PM

## 2021-10-11 NOTE — ED Notes (Signed)
E-signature pad unavailable - Pt verbalized understanding of D/C information - no additional concerns at this time.  

## 2021-10-14 ENCOUNTER — Telehealth: Payer: Self-pay | Admitting: *Deleted

## 2021-10-14 NOTE — Telephone Encounter (Signed)
Abdominal aortogram scheduled at [facility] for: October 15, 2021 8:30 AM Arrival time and place: [place] Entrance A at: 6:30 AM   Nothing to eat after midnight prior to procedure, clear liquids until 5 AM day of procedure.  Medication instructions: -Usual morning medications can be taken with sips of water including aspirin 81 mg and Plavix 75 mg.   Confirmed patient has responsible adult to drive home post procedure and be with patient first 24 hours after arriving home.  Patient reports no new symptoms concerning for COVID-19 in the past 10 days.  Reviewed procedure instructions with patient.

## 2021-10-15 ENCOUNTER — Encounter (HOSPITAL_COMMUNITY)
Admission: RE | Disposition: A | Discharge status: Home / Self Care | Payer: Self-pay | Source: Home / Self Care | Attending: Cardiovascular Disease

## 2021-10-15 ENCOUNTER — Telehealth: Payer: Self-pay

## 2021-10-15 ENCOUNTER — Other Ambulatory Visit: Payer: Self-pay

## 2021-10-15 ENCOUNTER — Ambulatory Visit (HOSPITAL_COMMUNITY)
Admission: RE | Admit: 2021-10-15 | Discharge: 2021-10-15 | Disposition: A | Discharge status: Home / Self Care | Payer: Commercial Managed Care - HMO | Attending: Cardiovascular Disease | Admitting: Cardiovascular Disease

## 2021-10-15 DIAGNOSIS — I25111 Atherosclerotic heart disease of native coronary artery with angina pectoris with documented spasm: Secondary | ICD-10-CM | POA: Insufficient documentation

## 2021-10-15 DIAGNOSIS — I70212 Atherosclerosis of native arteries of extremities with intermittent claudication, left leg: Secondary | ICD-10-CM

## 2021-10-15 DIAGNOSIS — Z7982 Long term (current) use of aspirin: Secondary | ICD-10-CM | POA: Insufficient documentation

## 2021-10-15 DIAGNOSIS — Z7902 Long term (current) use of antithrombotics/antiplatelets: Secondary | ICD-10-CM | POA: Insufficient documentation

## 2021-10-15 DIAGNOSIS — I700 Atherosclerosis of aorta: Secondary | ICD-10-CM | POA: Insufficient documentation

## 2021-10-15 DIAGNOSIS — I701 Atherosclerosis of renal artery: Secondary | ICD-10-CM | POA: Insufficient documentation

## 2021-10-15 DIAGNOSIS — Z79899 Other long term (current) drug therapy: Secondary | ICD-10-CM | POA: Insufficient documentation

## 2021-10-15 DIAGNOSIS — E782 Mixed hyperlipidemia: Secondary | ICD-10-CM | POA: Diagnosis not present

## 2021-10-15 DIAGNOSIS — I739 Peripheral vascular disease, unspecified: Secondary | ICD-10-CM

## 2021-10-15 DIAGNOSIS — I708 Atherosclerosis of other arteries: Secondary | ICD-10-CM | POA: Insufficient documentation

## 2021-10-15 DIAGNOSIS — Z87891 Personal history of nicotine dependence: Secondary | ICD-10-CM | POA: Insufficient documentation

## 2021-10-15 HISTORY — PX: ABDOMINAL AORTOGRAM W/LOWER EXTREMITY: CATH118223

## 2021-10-15 HISTORY — PX: PERIPHERAL VASCULAR INTERVENTION: CATH118257

## 2021-10-15 LAB — POCT ACTIVATED CLOTTING TIME
Activated Clotting Time: 239 seconds
Activated Clotting Time: 203 seconds

## 2021-10-15 SURGERY — ABDOMINAL AORTOGRAM W/LOWER EXTREMITY
Anesthesia: LOCAL

## 2021-10-15 MED ORDER — SODIUM CHLORIDE 0.9 % IV SOLN: 250.0000 mL | INTRAVENOUS | Status: DC | PRN

## 2021-10-15 MED ORDER — ATROPINE SULFATE 1 MG/10ML IJ SOSY
PREFILLED_SYRINGE | INTRAMUSCULAR | Status: AC
Start: 2021-10-15 — End: ?
  Filled 2021-10-15: qty 10

## 2021-10-15 MED ORDER — SODIUM CHLORIDE 0.9% FLUSH: 3.0000 mL | INTRAVENOUS | Status: DC | PRN

## 2021-10-15 MED ORDER — MIDAZOLAM HCL 2 MG/2ML IJ SOLN
INTRAMUSCULAR | Status: DC | PRN
  Administered 2021-10-15: 1 mg via INTRAVENOUS

## 2021-10-15 MED ORDER — IODIXANOL 320 MG/ML IV SOLN
INTRAVENOUS | Status: DC | PRN
  Administered 2021-10-15: 110 mL via INTRA_ARTERIAL

## 2021-10-15 MED ORDER — LIDOCAINE HCL (PF) 1 % IJ SOLN
INTRAMUSCULAR | Status: AC
  Filled 2021-10-15: qty 30

## 2021-10-15 MED ORDER — HEPARIN (PORCINE) IN NACL 1000-0.9 UT/500ML-% IV SOLN
INTRAVENOUS | Status: DC | PRN
  Administered 2021-10-15 (×2): 500 mL

## 2021-10-15 MED ORDER — SODIUM CHLORIDE 0.9% FLUSH: 3.0000 mL | Freq: Two times a day (BID) | INTRAVENOUS | Status: DC

## 2021-10-15 MED ORDER — ASPIRIN 81 MG PO CHEW: 81.0000 mg | CHEWABLE_TABLET | ORAL | Status: DC

## 2021-10-15 MED ORDER — SODIUM CHLORIDE 0.9 % WEIGHT BASED INFUSION: 1.0000 mL/kg/h | INTRAVENOUS | Status: DC

## 2021-10-15 MED ORDER — HEPARIN (PORCINE) IN NACL 1000-0.9 UT/500ML-% IV SOLN
INTRAVENOUS | Status: AC
  Filled 2021-10-15: qty 1000

## 2021-10-15 MED ORDER — SODIUM CHLORIDE 0.9 % IV SOLN
INTRAVENOUS | Status: AC
Start: 2021-10-15 — End: 2021-10-15

## 2021-10-15 MED ORDER — ACETAMINOPHEN 325 MG PO TABS
ORAL_TABLET | ORAL | Status: AC
Start: 2021-10-15 — End: ?
  Filled 2021-10-15: qty 2

## 2021-10-15 MED ORDER — HEPARIN SODIUM (PORCINE) 1000 UNIT/ML IJ SOLN
INTRAMUSCULAR | Status: DC | PRN
  Administered 2021-10-15: 4000 [IU] via INTRAVENOUS

## 2021-10-15 MED ORDER — HEPARIN SODIUM (PORCINE) 1000 UNIT/ML IJ SOLN
INTRAMUSCULAR | Status: AC
  Filled 2021-10-15: qty 10

## 2021-10-15 MED ORDER — SODIUM CHLORIDE 0.9 % WEIGHT BASED INFUSION
3.0000 mL/kg/h | INTRAVENOUS | Status: AC
  Administered 2021-10-15: 3 mL/kg/h via INTRAVENOUS

## 2021-10-15 MED ORDER — FENTANYL CITRATE (PF) 100 MCG/2ML IJ SOLN
INTRAMUSCULAR | Status: AC
  Filled 2021-10-15: qty 2

## 2021-10-15 MED ORDER — ONDANSETRON HCL 4 MG/2ML IJ SOLN: 4.0000 mg | Freq: Four times a day (QID) | INTRAMUSCULAR | Status: DC | PRN

## 2021-10-15 MED ORDER — FENTANYL CITRATE (PF) 100 MCG/2ML IJ SOLN
INTRAMUSCULAR | Status: DC | PRN
  Administered 2021-10-15: 25 ug via INTRAVENOUS

## 2021-10-15 MED ORDER — MIDAZOLAM HCL 2 MG/2ML IJ SOLN
INTRAMUSCULAR | Status: AC
  Filled 2021-10-15: qty 2

## 2021-10-15 MED ORDER — ACETAMINOPHEN 325 MG PO TABS
650.0000 mg | ORAL_TABLET | ORAL | Status: DC | PRN
  Administered 2021-10-15: 650 mg via ORAL

## 2021-10-15 MED ORDER — HYDRALAZINE HCL 20 MG/ML IJ SOLN: 5.0000 mg | INTRAMUSCULAR | Status: DC | PRN

## 2021-10-15 MED ORDER — LIDOCAINE HCL (PF) 1 % IJ SOLN
INTRAMUSCULAR | Status: DC | PRN
  Administered 2021-10-15: 30 mL

## 2021-10-15 SURGICAL SUPPLY — 23 items
BALLN IN.PACT DCB 7X40 (BALLOONS) ×3
BALLN MUSTANG 5X60X75 (BALLOONS) ×3
BALLN MUSTANG 6X60X135 (BALLOONS) ×3
BALLOON MUSTANG 5X60X75 (BALLOONS) IMPLANT
BALLOON MUSTANG 6X60X135 (BALLOONS) IMPLANT
CATH ANGIO 5F PIGTAIL 65CM (CATHETERS) ×1 IMPLANT
CATH CROSS OVER TEMPO 5F (CATHETERS) ×1 IMPLANT
DCB IN.PACT 7X40 (BALLOONS) IMPLANT
KIT ENCORE 26 ADVANTAGE (KITS) ×1 IMPLANT
KIT MICROPUNCTURE NIT STIFF (SHEATH) ×1 IMPLANT
KIT PV (KITS) ×3 IMPLANT
SHEATH PINNACLE 5F 10CM (SHEATH) ×1 IMPLANT
SHEATH PINNACLE 6F 10CM (SHEATH) ×1 IMPLANT
SHEATH PINNACLE MP 6F 45CM (SHEATH) ×1 IMPLANT
SHEATH PROBE COVER 6X72 (BAG) ×1 IMPLANT
STENT ELUVIA 7X80X130 (Permanent Stent) ×1 IMPLANT
SYR MEDRAD MARK 7 150ML (SYRINGE) ×3 IMPLANT
TAPE VIPERTRACK RADIOPAQ (MISCELLANEOUS) IMPLANT
TAPE VIPERTRACK RADIOPAQUE (MISCELLANEOUS) ×3
TRANSDUCER W/STOPCOCK (MISCELLANEOUS) ×3 IMPLANT
TRAY PV CATH (CUSTOM PROCEDURE TRAY) ×3 IMPLANT
WIRE BENTSON .035X145CM (WIRE) ×1 IMPLANT
WIRE HI TORQ VERSACORE 300 (WIRE) ×1 IMPLANT

## 2021-10-15 NOTE — Telephone Encounter (Signed)
-----   Message from Wellington Hampshire, MD sent at 10/15/2021 10:03 AM EDT ----- Schedule an ABI and aortoiliac duplex within 2 weeks and follow-up after.

## 2021-10-15 NOTE — Interval H&P Note (Signed)
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  She had recent dizziness and presyncope but work-up in the ED was negative.   10/15/2021 8:33 AM  Shelva Majestic  has presented today for surgery, with the diagnosis of pad.  The various methods of treatment have been discussed with the patient and family. After consideration of risks, benefits and other options for treatment, the patient has consented to  Procedure(s): ABDOMINAL AORTOGRAM W/LOWER EXTREMITY (N/A) as a surgical intervention.  The patient's history has been reviewed, patient examined, no change in status, stable for surgery.  I have reviewed the patient's chart and labs.  Questions were answered to the patient's satisfaction.     Kathlyn Sacramento

## 2021-10-15 NOTE — Progress Notes (Addendum)
SITE AREA: right groin/femoral  SITE PRIOR TO REMOVAL:  LEVEL 0  PRESSURE APPLIED FOR: approximately 20 minutes  MANUAL: yes  PATIENT STATUS DURING PULL: stable, slight dizziness noted and resolved, IV NS continues infusing  POST PULL SITE:  LEVEL 0  POST PULL INSTRUCTIONS GIVEN: yes  POST PULL PULSES PRESENT:  bilateral pedal pulses at +2  DRESSING APPLIED: gauze with tegaderm  BEDREST BEGINS @ 1117  COMMENTS:

## 2021-10-16 ENCOUNTER — Encounter (HOSPITAL_COMMUNITY): Payer: Self-pay | Admitting: Cardiovascular Disease

## 2021-10-16 LAB — POCT ACTIVATED CLOTTING TIME: Activated Clotting Time: 161 seconds

## 2021-10-29 ENCOUNTER — Ambulatory Visit (HOSPITAL_COMMUNITY)
Admit: 2021-10-29 | Discharge: 2021-10-29 | Disposition: A | Discharge status: Home / Self Care | Payer: Commercial Managed Care - HMO | Attending: Internal Medicine | Admitting: Internal Medicine

## 2021-10-29 ENCOUNTER — Ambulatory Visit (HOSPITAL_BASED_OUTPATIENT_CLINIC_OR_DEPARTMENT_OTHER)
Admit: 2021-10-29 | Discharge: 2021-10-29 | Disposition: A | Discharge status: Home / Self Care | Payer: Commercial Managed Care - HMO | Attending: Cardiovascular Disease | Admitting: Cardiovascular Disease

## 2021-10-29 DIAGNOSIS — I739 Peripheral vascular disease, unspecified: Secondary | ICD-10-CM | POA: Diagnosis not present

## 2021-10-30 ENCOUNTER — Other Ambulatory Visit: Payer: Self-pay

## 2021-10-30 ENCOUNTER — Ambulatory Visit: Payer: Commercial Managed Care - HMO | Admitting: Cardiovascular Disease

## 2021-10-30 ENCOUNTER — Emergency Department: Payer: Commercial Managed Care - HMO

## 2021-10-30 ENCOUNTER — Encounter: Payer: Self-pay | Admitting: Cardiovascular Disease

## 2021-10-30 ENCOUNTER — Inpatient Hospital Stay
Admission: EM | Admit: 2021-10-30 | Discharge: 2021-11-02 | DRG: 378 | Disposition: A | Discharge status: Home / Self Care | Payer: Commercial Managed Care - HMO | Attending: Internal Medicine | Admitting: Internal Medicine

## 2021-10-30 ENCOUNTER — Encounter: Payer: Self-pay | Admitting: Medical Oncology

## 2021-10-30 VITALS — BP 110/64 | HR 92 | Ht 62.0 in | Wt 101.4 lb

## 2021-10-30 DIAGNOSIS — I2510 Atherosclerotic heart disease of native coronary artery without angina pectoris: Secondary | ICD-10-CM | POA: Diagnosis not present

## 2021-10-30 DIAGNOSIS — Z9861 Coronary angioplasty status: Secondary | ICD-10-CM

## 2021-10-30 DIAGNOSIS — W1839XA Other fall on same level, initial encounter: Secondary | ICD-10-CM | POA: Diagnosis present

## 2021-10-30 DIAGNOSIS — K920 Hematemesis: Secondary | ICD-10-CM | POA: Diagnosis not present

## 2021-10-30 DIAGNOSIS — E876 Hypokalemia: Secondary | ICD-10-CM | POA: Diagnosis not present

## 2021-10-30 DIAGNOSIS — F1010 Alcohol abuse, uncomplicated: Secondary | ICD-10-CM

## 2021-10-30 DIAGNOSIS — E785 Hyperlipidemia, unspecified: Secondary | ICD-10-CM | POA: Diagnosis not present

## 2021-10-30 DIAGNOSIS — D62 Acute posthemorrhagic anemia: Secondary | ICD-10-CM | POA: Diagnosis present

## 2021-10-30 DIAGNOSIS — Z881 Allergy status to other antibiotic agents status: Secondary | ICD-10-CM

## 2021-10-30 DIAGNOSIS — W19XXXA Unspecified fall, initial encounter: Secondary | ICD-10-CM

## 2021-10-30 DIAGNOSIS — K922 Gastrointestinal hemorrhage, unspecified: Secondary | ICD-10-CM | POA: Diagnosis not present

## 2021-10-30 DIAGNOSIS — Z7902 Long term (current) use of antithrombotics/antiplatelets: Secondary | ICD-10-CM

## 2021-10-30 DIAGNOSIS — S0083XA Contusion of other part of head, initial encounter: Secondary | ICD-10-CM | POA: Diagnosis present

## 2021-10-30 DIAGNOSIS — Z85810 Personal history of malignant neoplasm of tongue: Secondary | ICD-10-CM

## 2021-10-30 DIAGNOSIS — R55 Syncope and collapse: Secondary | ICD-10-CM | POA: Diagnosis not present

## 2021-10-30 DIAGNOSIS — K254 Chronic or unspecified gastric ulcer with hemorrhage: Principal | ICD-10-CM | POA: Diagnosis present

## 2021-10-30 DIAGNOSIS — E872 Acidosis, unspecified: Secondary | ICD-10-CM | POA: Diagnosis present

## 2021-10-30 DIAGNOSIS — E871 Hypo-osmolality and hyponatremia: Secondary | ICD-10-CM | POA: Diagnosis present

## 2021-10-30 DIAGNOSIS — K760 Fatty (change of) liver, not elsewhere classified: Secondary | ICD-10-CM | POA: Diagnosis present

## 2021-10-30 DIAGNOSIS — K921 Melena: Secondary | ICD-10-CM | POA: Diagnosis present

## 2021-10-30 DIAGNOSIS — I739 Peripheral vascular disease, unspecified: Secondary | ICD-10-CM

## 2021-10-30 DIAGNOSIS — I25118 Atherosclerotic heart disease of native coronary artery with other forms of angina pectoris: Secondary | ICD-10-CM

## 2021-10-30 DIAGNOSIS — R1084 Generalized abdominal pain: Secondary | ICD-10-CM

## 2021-10-30 DIAGNOSIS — T39395A Adverse effect of other nonsteroidal anti-inflammatory drugs [NSAID], initial encounter: Secondary | ICD-10-CM | POA: Diagnosis present

## 2021-10-30 DIAGNOSIS — E861 Hypovolemia: Secondary | ICD-10-CM | POA: Diagnosis present

## 2021-10-30 DIAGNOSIS — I959 Hypotension, unspecified: Secondary | ICD-10-CM | POA: Diagnosis present

## 2021-10-30 DIAGNOSIS — Z7982 Long term (current) use of aspirin: Secondary | ICD-10-CM

## 2021-10-30 DIAGNOSIS — E538 Deficiency of other specified B group vitamins: Secondary | ICD-10-CM | POA: Diagnosis present

## 2021-10-30 DIAGNOSIS — K449 Diaphragmatic hernia without obstruction or gangrene: Secondary | ICD-10-CM | POA: Diagnosis present

## 2021-10-30 DIAGNOSIS — Z955 Presence of coronary angioplasty implant and graft: Secondary | ICD-10-CM

## 2021-10-30 DIAGNOSIS — Z79899 Other long term (current) drug therapy: Secondary | ICD-10-CM

## 2021-10-30 DIAGNOSIS — G8929 Other chronic pain: Secondary | ICD-10-CM | POA: Diagnosis present

## 2021-10-30 DIAGNOSIS — D649 Anemia, unspecified: Secondary | ICD-10-CM

## 2021-10-30 DIAGNOSIS — R109 Unspecified abdominal pain: Secondary | ICD-10-CM | POA: Diagnosis present

## 2021-10-30 DIAGNOSIS — F17200 Nicotine dependence, unspecified, uncomplicated: Secondary | ICD-10-CM | POA: Diagnosis present

## 2021-10-30 LAB — CBC
HCT: 25.9 % — ABNORMAL LOW (ref 36.0–46.0)
Hemoglobin: 8.6 g/dL — ABNORMAL LOW (ref 12.0–15.0)
MCH: 32 pg (ref 26.0–34.0)
MCHC: 33.2 g/dL (ref 30.0–36.0)
MCV: 96.3 fL (ref 80.0–100.0)
Platelets: 342 10*3/uL (ref 150–400)
RBC: 2.69 MIL/uL — ABNORMAL LOW (ref 3.87–5.11)
RDW: 13.8 % (ref 11.5–15.5)
WBC: 11.5 10*3/uL — ABNORMAL HIGH (ref 4.0–10.5)
nRBC: 0 % (ref 0.0–0.2)

## 2021-10-30 LAB — COMPREHENSIVE METABOLIC PANEL
ALT: 18 U/L (ref 0–44)
AST: 34 U/L (ref 15–41)
Albumin: 3.5 g/dL (ref 3.5–5.0)
Alkaline Phosphatase: 53 U/L (ref 38–126)
Anion gap: 13 (ref 5–15)
BUN: 32 mg/dL — ABNORMAL HIGH (ref 6–20)
CO2: 19 mmol/L — ABNORMAL LOW (ref 22–32)
Calcium: 8.1 mg/dL — ABNORMAL LOW (ref 8.9–10.3)
Chloride: 97 mmol/L — ABNORMAL LOW (ref 98–111)
Creatinine, Ser: 0.82 mg/dL (ref 0.44–1.00)
GFR, Estimated: >60 mL/min (ref >60)
Glucose, Bld: 123 mg/dL — ABNORMAL HIGH (ref 70–99)
Potassium: 3.7 mmol/L (ref 3.5–5.1)
Sodium: 129 mmol/L — ABNORMAL LOW (ref 135–145)
Total Bilirubin: 0.6 mg/dL (ref 0.3–1.2)
Total Protein: 6.2 g/dL — ABNORMAL LOW (ref 6.5–8.1)

## 2021-10-30 LAB — FERRITIN: Ferritin: 20 ng/mL (ref 11–307)

## 2021-10-30 LAB — IRON AND TIBC
Iron: 56 ug/dL (ref 28–170)
Saturation Ratios: 16 % (ref 10.4–31.8)
TIBC: 351 ug/dL (ref 250–450)
UIBC: 295 ug/dL

## 2021-10-30 LAB — LACTIC ACID, PLASMA
Lactic Acid, Venous: 4.2 mmol/L (ref 0.5–1.9)
Lactic Acid, Venous: 3.6 mmol/L (ref 0.5–1.9)

## 2021-10-30 LAB — FOLATE: Folate: 7.2 ng/mL (ref >5.9)

## 2021-10-30 LAB — APTT: aPTT: 25 seconds (ref 24–36)

## 2021-10-30 LAB — PROTIME-INR
INR: 1.1 (ref 0.8–1.2)
Prothrombin Time: 14.1 seconds (ref 11.4–15.2)

## 2021-10-30 LAB — TROPONIN I (HIGH SENSITIVITY)
Troponin I (High Sensitivity): 10 ng/L (ref <18)
Troponin I (High Sensitivity): 11 ng/L (ref <18)

## 2021-10-30 LAB — MAGNESIUM: Magnesium: 1.9 mg/dL (ref 1.7–2.4)

## 2021-10-30 MED ORDER — LORAZEPAM 2 MG/ML IJ SOLN
INTRAMUSCULAR | Status: AC
  Filled 2021-10-30: qty 1

## 2021-10-30 MED ORDER — THIAMINE HCL 100 MG PO TABS
100.0000 mg | ORAL_TABLET | Freq: Every day | ORAL | Status: DC
  Administered 2021-10-31 – 2021-11-02 (×3): 100 mg via ORAL
  Filled 2021-10-30 (×3): qty 1

## 2021-10-30 MED ORDER — SENNOSIDES-DOCUSATE SODIUM 8.6-50 MG PO TABS: 1.0000 | ORAL_TABLET | Freq: Every evening | ORAL | Status: DC | PRN

## 2021-10-30 MED ORDER — ONDANSETRON HCL 4 MG/2ML IJ SOLN: 4.0000 mg | Freq: Four times a day (QID) | INTRAMUSCULAR | Status: DC | PRN

## 2021-10-30 MED ORDER — ATORVASTATIN CALCIUM 80 MG PO TABS
80.0000 mg | ORAL_TABLET | Freq: Every day | ORAL | Status: DC
  Administered 2021-10-31 – 2021-11-01 (×2): 80 mg via ORAL
  Filled 2021-10-30 (×2): qty 1

## 2021-10-30 MED ORDER — ACETAMINOPHEN 325 MG PO TABS: 650.0000 mg | ORAL_TABLET | Freq: Four times a day (QID) | ORAL | Status: DC | PRN

## 2021-10-30 MED ORDER — LORAZEPAM 2 MG/ML IJ SOLN
0.5000 mg | Freq: Once | INTRAMUSCULAR | Status: AC
  Administered 2021-10-30: 0.5 mg via INTRAVENOUS

## 2021-10-30 MED ORDER — SODIUM CHLORIDE 0.9 % IV SOLN: INTRAVENOUS | Status: AC

## 2021-10-30 MED ORDER — PANTOPRAZOLE SODIUM 40 MG IV SOLR: 40.0000 mg | Freq: Two times a day (BID) | INTRAVENOUS | Status: DC

## 2021-10-30 MED ORDER — THIAMINE HCL 100 MG/ML IJ SOLN
100.0000 mg | Freq: Every day | INTRAMUSCULAR | Status: DC
Start: 2021-10-30 — End: 2021-11-02
  Administered 2021-10-30: 100 mg via INTRAVENOUS
  Filled 2021-10-30 (×2): qty 2

## 2021-10-30 MED ORDER — IOHEXOL 350 MG/ML SOLN
100.0000 mL | Freq: Once | INTRAVENOUS | Status: AC | PRN
  Administered 2021-10-30: 100 mL via INTRAVENOUS

## 2021-10-30 MED ORDER — ADULT MULTIVITAMIN W/MINERALS CH
1.0000 | ORAL_TABLET | Freq: Every day | ORAL | Status: DC
  Administered 2021-10-31 – 2021-11-02 (×3): 1 via ORAL
  Filled 2021-10-30 (×3): qty 1

## 2021-10-30 MED ORDER — LORAZEPAM 1 MG PO TABS: 1.0000 mg | ORAL_TABLET | ORAL | Status: DC | PRN

## 2021-10-30 MED ORDER — ONDANSETRON HCL 4 MG PO TABS: 4.0000 mg | ORAL_TABLET | Freq: Four times a day (QID) | ORAL | Status: DC | PRN

## 2021-10-30 MED ORDER — ACETAMINOPHEN 650 MG RE SUPP: 650.0000 mg | Freq: Four times a day (QID) | RECTAL | Status: DC | PRN

## 2021-10-30 MED ORDER — LACTATED RINGERS IV BOLUS
1000.0000 mL | Freq: Once | INTRAVENOUS | Status: AC
  Administered 2021-10-30: 1000 mL via INTRAVENOUS

## 2021-10-30 MED ORDER — PANTOPRAZOLE 80MG IVPB - SIMPLE MED
80.0000 mg | Freq: Once | INTRAVENOUS | Status: AC
  Administered 2021-10-30: 80 mg via INTRAVENOUS
  Filled 2021-10-30: qty 100

## 2021-10-30 MED ORDER — MIDODRINE HCL 5 MG PO TABS
10.0000 mg | ORAL_TABLET | ORAL | Status: DC | PRN
Start: 2021-10-30 — End: 2021-11-02

## 2021-10-30 MED ORDER — PANTOPRAZOLE INFUSION (NEW) - SIMPLE MED
8.0000 mg/h | INTRAVENOUS | Status: AC
  Administered 2021-10-30 – 2021-11-01 (×6): 8 mg/h via INTRAVENOUS
  Filled 2021-10-30 (×6): qty 100

## 2021-10-30 MED ORDER — LORAZEPAM 2 MG/ML IJ SOLN: 1.0000 mg | INTRAMUSCULAR | Status: DC | PRN

## 2021-10-30 MED ORDER — MORPHINE SULFATE (PF) 2 MG/ML IV SOLN: 2.0000 mg | INTRAVENOUS | Status: DC | PRN

## 2021-10-30 MED ORDER — FOLIC ACID 1 MG PO TABS
1.0000 mg | ORAL_TABLET | Freq: Every day | ORAL | Status: DC
  Administered 2021-10-31 – 2021-11-02 (×3): 1 mg via ORAL
  Filled 2021-10-30 (×3): qty 1

## 2021-10-30 NOTE — Assessment & Plan Note (Signed)
-   Presumed secondary to beer drinking - Status post LR 1 L bolus per EDP - Ordered sodium chloride 125 mL/h, 1 day ordered - BMP in the a.m.

## 2021-10-30 NOTE — H&P (Signed)
History and Physical   Sonya Harmon QAS:[id number] DOB: 12-Sep-1970 DOA: 10/30/2021  PCP: Leonides Sake, MD  Outpatient Specialists: Dr. Fletcher Anon, [facility] cardiology Patient coming from: Outpatient cardiology clinic  I have personally briefly reviewed patient's old medical records in [place].  Chief Concern: Syncope and coffee-ground hematemesis  HPI: [name] is a 51 year old female with history of alcohol abuse, tobacco dependence, CAD status post PCI in 09/10/2021, who presents emergency department for chief concerns of syncope.  Initial vitals in the emergency department showed temperature of 97.9, respiration rate of 19, heart rate of 99, blood pressure 103/69, SPO2 of 100% on room air.  Serum sodium is 129, potassium 3.7, chloride 97, bicarb 19, BUN of 32, serum creatinine of 0.82, GFR greater than 60, nonfasting blood glucose 123, WBC 11.5, hemoglobin 8.6, platelets of 342.  High sensitive troponin was 10.  Lactic acid was 4.2.  ED treatment: Protonix bolus and gtt., LR 1 L bolus.  At is able to tell me her name, age, current locatio, current year. She vomited three times prior to my evaluation at bedside. She reports that after having a bowel movmenet this a.m., she passed out and hit her left jaw.  She woke up with her granddaughter standing over her.  She also endorses dark coffee-ground emesis at home prior to her arrival at cardiology clinic.  At the cardiology clinic, she had 1 episode of coffee ground/chocolate colored hematemesis while being evaluated by Dr. Fletcher Anon.  She endorses that she had 1 episode of coffee-ground emesis in the emergency department. She denies fever, new cough, dysuria, hematuria, diarrhea.  She endorses shortness of breath, worse with exertion.  She endorses epigastric and left upper quadrant abdominal discomfort.  She states her last bowel  movement was dark, nearly black on AM pror to coming to Dr. Tyrell Antonio office.   Social history: She  works at [employer]. She is a former tobacco, quitting in June 2023. At her peak, she smoked 1 ppd and started tobacco use at age 69. She drinks 3-4 beers per day. Last drink was 7/26/223.   ROS: Constitutional: no weight change, no fever ENT/Mouth: no sore throat, no rhinorrhea Eyes: no eye pain, no vision changes Cardiovascular: no chest pain, no dyspnea,  no edema, no palpitations Respiratory: no cough, no sputum, no wheezing Gastrointestinal: + nausea, + vomiting, no diarrhea, no constipation, + melena Genitourinary: no urinary incontinence, no dysuria, no hematuria Musculoskeletal: no arthralgias, no myalgias Skin: no skin lesions, no pruritus, Neuro: + weakness, + loss of consciousness, + syncope Psych: no anxiety, no depression, no decrease appetite Heme/Lymph: no bruising, no bleeding  ED Course: Discussed with emergency medicine provider, patient requiring hospitalization for chief concerns of upper GI bleed.  Assessment/Plan  Principal Problem:   Upper GI bleed Active Problems:   Tobacco dependence   Alcohol abuse   Abdominal pain   Hepatic steatosis   PAD (peripheral artery disease) (HCC)   Hyponatremia   Traumatic ecchymosis of face   CAD S/P percutaneous coronary angioplasty   Assessment and Plan:  * Upper GI bleed - Status post Protonix bolus and gtt. ordered by EDP - GI has been consulted, we appreciate further recommendations - Maintain hemoglobin greater than 8 - Continue n.p.o. pending GI evaluation - Ensure bilateral large-bore PIV - CBC in the a.m.  Alcohol abuse - CIWA precautions - Alcohol cessation counseling has been given to patient  Tobacco dependence - Patient quit working in June 2023  CAD  S/P percutaneous coronary angioplasty - Holding home aspirin and Plavix at this time - Cardiology has been consulted and recommends holding DAPT for 1 to 2 days and possible resumption of Plavix only - We appreciate further recommendations - Holding  home metoprolol as patient is low normotensive on presentation - Atorvastatin 80 mg nightly resumed  Traumatic ecchymosis of face - POA, left jaw and left side of her face, secondary to traumatic syncope at home  Hyponatremia - Presumed secondary to beer drinking - Status post LR 1 L bolus per EDP - Ordered sodium chloride 125 mL/h, 1 day ordered - BMP in the a.m.  Hepatic steatosis - Presumed secondary to alcohol abuse  Abdominal pain - Presumed secondary to upper GI bleed with possible H. pylori/ulcer infection - GI has been consulted  Chart reviewed.   DVT prophylaxis: TED hose Code Status: Full code Diet: N.p.o. except for sips with meds Family Communication: No Disposition Plan: Pending clinical course Consults called: Cardiology and gastroenterology Admission status: Progressive cardiac, observation  Past Medical History:  Diagnosis Date   Alcohol abuse    Coronary artery disease 09/2021   s/p PCI to RCA in setting of unstable angina   History of tongue cancer    PAD (peripheral artery disease) ([place])    Tobacco abuse    Past Surgical History:  Procedure Laterality Date   ABDOMINAL AORTOGRAM W/LOWER EXTREMITY N/A 10/15/2021   Procedure: ABDOMINAL AORTOGRAM W/LOWER EXTREMITY;  Surgeon: Wellington Hampshire, MD;  Location: [place] [facility];  Service: Cardiovascular;  Laterality: N/A;   BILATERAL SALPINGECTOMY     CORONARY BALLOON ANGIOPLASTY N/A 09/08/2021   Procedure: CORONARY BALLOON ANGIOPLASTY - ABORTED;  Surgeon: Wellington Hampshire, MD;  Location: [place] [facility];  Service: Cardiovascular;  Laterality: N/A;   CORONARY STENT INTERVENTION N/A 09/10/2021   Procedure: CORONARY STENT INTERVENTION;  Surgeon: Wellington Hampshire, MD;  Location: [place] [facility];  Service: Cardiovascular;  Laterality: N/A;   LEFT HEART CATH AND CORONARY ANGIOGRAPHY N/A 09/08/2021   Procedure: LEFT HEART CATH AND CORONARY ANGIOGRAPHY;  Surgeon: Dionisio David, MD;  Location: [place] [facility];  Service: Cardiovascular;  Laterality: N/A;   PERIPHERAL VASCULAR INTERVENTION  10/15/2021   Procedure: PERIPHERAL VASCULAR INTERVENTION;  Surgeon: Wellington Hampshire, MD;  Location: [place] [facility];  Service: Cardiovascular;;   TONGUE SURGERY     Social History:  reports that she quit smoking about 7 weeks ago. Her smoking use included cigarettes. She smoked an average of .5 packs per day. She has quit using smokeless tobacco. She reports that she does not currently use alcohol after a past usage of about 42.0 standard drinks of alcohol per week. She reports that she does not currently use drugs after having used the following drugs: Marijuana.  Allergies  Allergen Reactions   Amoxicil &Clarithro &Lansopraz Nausea And Vomiting   Amoxicillin Rash   Family History  Problem Relation Age of Onset   Cancer Mother    Cancer Father    Stroke Sister    Aneurysm Sister    Family history: Family history reviewed and not pertinent  Prior to Admission medications   Medication Sig Start Date End Date Taking? Authorizing Provider  aspirin 81 MG chewable tablet Chew 1 tablet (81 mg total) by mouth daily. 09/11/21 09/11/22  Enzo Bi, MD  atorvastatin (LIPITOR) 80 MG tablet Take 1 tablet (80 mg total) by mouth daily. 09/11/21 12/10/21  Enzo Bi, MD  clopidogrel (PLAVIX) 75 MG  tablet Take 1 tablet (75 mg total) by mouth daily. 09/11/21 09/11/22  Enzo Bi, MD  metoprolol tartrate (LOPRESSOR) 25 MG tablet Take 0.5 tablets (12.5 mg total) by mouth 2 (two) times daily. 09/11/21 12/10/21  Enzo Bi, MD   Physical Exam: Vitals:   10/30/21 0959 10/30/21 1000 10/30/21 1120 10/30/21 1200  BP:  103/69 101/65 101/67  Pulse:  99  76  Resp:  '19 16 11  '$ Temp:      TempSrc:      SpO2:  100% 100% 100%  Weight: 45 kg     Height: '5\' 2"'$  (1.575 m)      Constitutional: appears age-appropriate, NAD, calm, comfortable Eyes: PERRL, lids and conjunctivae normal ENMT: Mucous membranes are moist. Posterior pharynx  clear of any exudate or lesions. Age-appropriate dentition. Hearing appropriate Neck: normal, supple, no masses, no thyromegaly Respiratory: clear to auscultation bilaterally, no wheezing, no crackles. Normal respiratory effort. No accessory muscle use.  Cardiovascular: Regular rate and rhythm, no murmurs / rubs / gallops. No extremity edema. 2+ pedal pulses. No carotid bruits.  Abdomen: no tenderness, no masses palpated, no hepatosplenomegaly. Bowel sounds positive.  Musculoskeletal: no clubbing / cyanosis. No joint deformity upper and lower extremities. Good ROM, no contractures, no atrophy. Normal muscle tone.  Skin: no rashes, lesions, ulcers. No induration.  Left jaw and left side of face ecchymosis Neurologic: Sensation intact. Strength 5/5 in all 4.  Psychiatric: Normal judgment and insight. Alert and oriented x 3. Normal mood.   EKG: independently reviewed, showing sinus rhythm with rate of 83, QTc 499  Chest x-ray on Admission: I personally reviewed and I agree with radiologist reading as below.  CT T-SPINE NO CHARGE  Result Date: 10/30/2021 CLINICAL DATA:  Syncope and fall EXAM: CT Thoracic and Lumbar spine with contrast TECHNIQUE: Multiplanar CT images of the thoracic and lumbar spine were reconstructed from contemporary CT of the Chest, Abdomen, and Pelvis. RADIATION DOSE REDUCTION: This exam was performed according to the departmental dose-optimization program which includes automated exposure control, adjustment of the mA and/or kV according to patient size and/or use of iterative reconstruction technique. CONTRAST:  100 cc Omnipaque 350 COMPARISON:  None Available. CTA chest/abdomen/pelvis 09/08/2018 FINDINGS: CT THORACIC SPINE FINDINGS Alignment: There is mild dextrocurvature centered in the midthoracic spine. There is no antero or retrolisthesis or other evidence of traumatic malalignment. Vertebrae: Vertebral body heights are preserved. There is no evidence of acute fracture. There  is no suspicious osseous lesion. Paraspinal and other soft tissues: The paraspinal soft tissues are unremarkable. The heart and lungs are assessed on the separately dictated CTA chest. Disc levels: There is no significant osseous spinal canal or neural foraminal stenosis. CT LUMBAR SPINE FINDINGS Segmentation: Standard. Alignment: Normal. Vertebrae: Vertebral body heights are preserved. There is no evidence of acute fracture. There is no suspicious osseous lesion. Paraspinal and other soft tissues: The paraspinal soft tissues are unremarkable. The abdominal and pelvic viscera are assessed on the separately dictated CT abdomen/pelvis. Disc levels: There is no significant spinal canal or neural foraminal stenosis. IMPRESSION: No acute fracture or traumatic malalignment of the thoracolumbar spine. Electronically Signed   By: Valetta Mole M.D.   On: 10/30/2021 11:51   CT L-SPINE NO CHARGE  Result Date: 10/30/2021 CLINICAL DATA:  Syncope and fall EXAM: CT Thoracic and Lumbar spine with contrast TECHNIQUE: Multiplanar CT images of the thoracic and lumbar spine were reconstructed from contemporary CT of the Chest, Abdomen, and Pelvis. RADIATION DOSE REDUCTION: This exam was performed  according to the departmental dose-optimization program which includes automated exposure control, adjustment of the mA and/or kV according to patient size and/or use of iterative reconstruction technique. CONTRAST:  100 cc Omnipaque 350 COMPARISON:  None Available. CTA chest/abdomen/pelvis 09/08/2018 FINDINGS: CT THORACIC SPINE FINDINGS Alignment: There is mild dextrocurvature centered in the midthoracic spine. There is no antero or retrolisthesis or other evidence of traumatic malalignment. Vertebrae: Vertebral body heights are preserved. There is no evidence of acute fracture. There is no suspicious osseous lesion. Paraspinal and other soft tissues: The paraspinal soft tissues are unremarkable. The heart and lungs are assessed on the  separately dictated CTA chest. Disc levels: There is no significant osseous spinal canal or neural foraminal stenosis. CT LUMBAR SPINE FINDINGS Segmentation: Standard. Alignment: Normal. Vertebrae: Vertebral body heights are preserved. There is no evidence of acute fracture. There is no suspicious osseous lesion. Paraspinal and other soft tissues: The paraspinal soft tissues are unremarkable. The abdominal and pelvic viscera are assessed on the separately dictated CT abdomen/pelvis. Disc levels: There is no significant spinal canal or neural foraminal stenosis. IMPRESSION: No acute fracture or traumatic malalignment of the thoracolumbar spine. Electronically Signed   By: Valetta Mole M.D.   On: 10/30/2021 11:51   CT Angio Chest/Abd/Pel for Dissection W and/or Wo Contrast  Result Date: 10/30/2021 CLINICAL DATA:  Acute aortic syndrome suspected. Bloody emesis. Syncopal episode. EXAM: CT ANGIOGRAPHY CHEST, ABDOMEN AND PELVIS TECHNIQUE: Non-contrast CT of the chest was initially obtained. Multidetector CT imaging through the chest, abdomen and pelvis was performed using the standard protocol during bolus administration of intravenous contrast. Multiplanar reconstructed images and MIPs were obtained and reviewed to evaluate the vascular anatomy. RADIATION DOSE REDUCTION: This exam was performed according to the departmental dose-optimization program which includes automated exposure control, adjustment of the mA and/or kV according to patient size and/or use of iterative reconstruction technique. CONTRAST:  195m OMNIPAQUE IOHEXOL 350 MG/ML SOLN COMPARISON:  CTA 09/07/2021 FINDINGS: CTA CHEST FINDINGS Cardiovascular: No aortic dissection or aneurysm. Ascending aorta is nonaneurysmal measuring 31 mm. Intimal calcification is at the origin of the LEFT subclavian artery. Otherwise great vessels are normal. Descending thoracic aorta demonstrates mild intimal calcification without aneurysm or dissection. No evidence acute  pulmonary embolism. Mediastinum/Nodes: Normal esophagus and trachea.  No adenopathy. Lungs/Pleura: No pulmonary infarction. No pneumothorax. No airspace disease. Musculoskeletal: No acute osseous abnormality. Review of the MIP images confirms the above findings. CTA ABDOMEN AND PELVIS FINDINGS VASCULAR Aorta: Normal caliber aorta without aneurysm, dissection, vasculitis or significant stenosis. Celiac: Patent without evidence of aneurysm, dissection, vasculitis or significant stenosis. SMA: Patent without evidence of aneurysm, dissection, vasculitis or significant stenosis. Again demonstrated smooth narrowing along the dorsal margin of the vessel with reconstitution. No change from prior. Renals: Both renal arteries are patent without evidence of aneurysm, dissection, vasculitis, fibromuscular dysplasia or significant stenosis. IMA: Patent without evidence of aneurysm, dissection, vasculitis or significant stenosis. Inflow: Calcific plaque with stenosis of the LEFT common femoral artery. Reconstitution. No change from prior. Veins: No obvious venous abnormality within the limitations of this arterial phase study. Review of the MIP images confirms the above findings. NON-VASCULAR Hepatobiliary: No focal hepatic lesion. No biliary duct dilatation. Common bile duct is normal. Pancreas: Pancreas is normal. No ductal dilatation. No pancreatic inflammation. Spleen: Normal spleen Adrenals/urinary tract: Adrenal glands and kidneys are normal. The ureters and bladder normal. Stomach/Bowel: Stomach, small bowel, appendix, and cecum are normal. The colon and rectosigmoid colon are normal. Vascular/Lymphatic: Abdominal aorta is normal caliber.  No periportal or retroperitoneal adenopathy. No pelvic adenopathy. Reproductive: Uterus and adnexa unremarkable. Other: No free fluid. Musculoskeletal: No aggressive osseous lesion. Review of the MIP images confirms the above findings. IMPRESSION: Chest Impression: 1. No evidence of  aortic dissection or aneurysm. 2. 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 3. No evidence acute pulmonary embolism. 4. No acute pulmonary parenchymal findings. Abdomen / Pelvis Impression: 1. No acute findings the abdominal aorta or branches. Atherosclerotic calcification again noted. No change in proximal stenosis of the SMA and LEFT common iliac artery compared to CT a 09/07/2021. 2. No acute findings in the abdomen pelvis. Electronically Signed   By: Suzy Bouchard M.D.   On: 10/30/2021 11:39   CT HEAD WO CONTRAST (5MM)  Result Date: 10/30/2021 CLINICAL DATA:  Head trauma, moderate-severe; Facial trauma, blunt; Neck trauma, dangerous injury mechanism (Age 39-64y) EXAM: CT HEAD WITHOUT CONTRAST CT MAXILLOFACIAL WITHOUT CONTRAST CT CERVICAL SPINE WITHOUT CONTRAST TECHNIQUE: Multidetector CT imaging of the head, cervical spine, and maxillofacial structures were performed using the standard protocol without intravenous contrast. Multiplanar CT image reconstructions of the cervical spine and maxillofacial structures were also generated. RADIATION DOSE REDUCTION: This exam was performed according to the departmental dose-optimization program which includes automated exposure control, adjustment of the mA and/or kV according to patient size and/or use of iterative reconstruction technique. COMPARISON:  None Available. FINDINGS: CT HEAD FINDINGS Brain: No evidence of acute infarction, hemorrhage, hydrocephalus, extra-axial collection or mass lesion/mass effect. Vascular: No hyperdense vessel. Skull: No acute fracture. Other: No mastoid effusions. CT MAXILLOFACIAL FINDINGS Osseous: No fracture or mandibular dislocation. No destructive process. Orbits: Negative. No traumatic or inflammatory finding. Sinuses: Minimal inferior maxillary sinus mucosal thickening. Otherwise, clear. Soft tissues: Negative. CT CERVICAL SPINE FINDINGS Alignment: Reversal of normal cervical  lordosis. Skull base and vertebrae: Vertebral body heights are maintained. No acute fracture Soft tissues and spinal canal: No prevertebral fluid or swelling. No visible canal hematoma. Disc levels:  No significant focal bony degenerative change. Upper chest: Visualized lung apices are clear. IMPRESSION: 1. No evidence of acute intracranial abnormality or facial fracture. 2. No evidence of acute fracture or traumatic malalignment the cervical spine. Electronically Signed   By: Margaretha Sheffield M.D.   On: 10/30/2021 11:28   CT Cervical Spine Wo Contrast  Result Date: 10/30/2021 CLINICAL DATA:  Head trauma, moderate-severe; Facial trauma, blunt; Neck trauma, dangerous injury mechanism (Age 41-64y) EXAM: CT HEAD WITHOUT CONTRAST CT MAXILLOFACIAL WITHOUT CONTRAST CT CERVICAL SPINE WITHOUT CONTRAST TECHNIQUE: Multidetector CT imaging of the head, cervical spine, and maxillofacial structures were performed using the standard protocol without intravenous contrast. Multiplanar CT image reconstructions of the cervical spine and maxillofacial structures were also generated. RADIATION DOSE REDUCTION: This exam was performed according to the departmental dose-optimization program which includes automated exposure control, adjustment of the mA and/or kV according to patient size and/or use of iterative reconstruction technique. COMPARISON:  None Available. FINDINGS: CT HEAD FINDINGS Brain: No evidence of acute infarction, hemorrhage, hydrocephalus, extra-axial collection or mass lesion/mass effect. Vascular: No hyperdense vessel. Skull: No acute fracture. Other: No mastoid effusions. CT MAXILLOFACIAL FINDINGS Osseous: No fracture or mandibular dislocation. No destructive process. Orbits: Negative. No traumatic or inflammatory finding. Sinuses: Minimal inferior maxillary sinus mucosal thickening. Otherwise, clear. Soft tissues: Negative. CT CERVICAL SPINE FINDINGS Alignment: Reversal of normal cervical lordosis. Skull base  and vertebrae: Vertebral body heights are maintained. No acute fracture Soft tissues and spinal canal: No prevertebral fluid or swelling. No visible  canal hematoma. Disc levels:  No significant focal bony degenerative change. Upper chest: Visualized lung apices are clear. IMPRESSION: 1. No evidence of acute intracranial abnormality or facial fracture. 2. No evidence of acute fracture or traumatic malalignment the cervical spine. Electronically Signed   By: Margaretha Sheffield M.D.   On: 10/30/2021 11:28   CT Maxillofacial Wo Contrast  Result Date: 10/30/2021 CLINICAL DATA:  Head trauma, moderate-severe; Facial trauma, blunt; Neck trauma, dangerous injury mechanism (Age 65-64y) EXAM: CT HEAD WITHOUT CONTRAST CT MAXILLOFACIAL WITHOUT CONTRAST CT CERVICAL SPINE WITHOUT CONTRAST TECHNIQUE: Multidetector CT imaging of the head, cervical spine, and maxillofacial structures were performed using the standard protocol without intravenous contrast. Multiplanar CT image reconstructions of the cervical spine and maxillofacial structures were also generated. RADIATION DOSE REDUCTION: This exam was performed according to the departmental dose-optimization program which includes automated exposure control, adjustment of the mA and/or kV according to patient size and/or use of iterative reconstruction technique. COMPARISON:  None Available. FINDINGS: CT HEAD FINDINGS Brain: No evidence of acute infarction, hemorrhage, hydrocephalus, extra-axial collection or mass lesion/mass effect. Vascular: No hyperdense vessel. Skull: No acute fracture. Other: No mastoid effusions. CT MAXILLOFACIAL FINDINGS Osseous: No fracture or mandibular dislocation. No destructive process. Orbits: Negative. No traumatic or inflammatory finding. Sinuses: Minimal inferior maxillary sinus mucosal thickening. Otherwise, clear. Soft tissues: Negative. CT CERVICAL SPINE FINDINGS Alignment: Reversal of normal cervical lordosis. Skull base and vertebrae:  Vertebral body heights are maintained. No acute fracture Soft tissues and spinal canal: No prevertebral fluid or swelling. No visible canal hematoma. Disc levels:  No significant focal bony degenerative change. Upper chest: Visualized lung apices are clear. IMPRESSION: 1. No evidence of acute intracranial abnormality or facial fracture. 2. No evidence of acute fracture or traumatic malalignment the cervical spine. Electronically Signed   By: Margaretha Sheffield M.D.   On: 10/30/2021 11:28   DG Elbow Complete Left  Result Date: 10/30/2021 CLINICAL DATA:  Syncopal episode.  Fell.  Left elbow pain. EXAM: LEFT ELBOW - COMPLETE 3+ VIEW COMPARISON:  None Available. FINDINGS: The joint spaces are maintained. No acute elbow fracture. No osteochondral lesion. No joint effusion. IMPRESSION: No acute bony findings. Electronically Signed   By: Marijo Sanes M.D.   On: 10/30/2021 10:37   VAS US AORTA/IVC/ILIACS  Result Date: 10/30/2021 ABDOMINAL AORTA STUDY Patient Name:  LANA FLAIM  Date of Exam:   10/29/2021 Medical Rec #: [id number]       Accession #:    [id number] Date of Birth: 06/06/1970       Patient Gender: F Patient Age:   61 years Exam Location:  [facility] Procedure:      VAS US AORTA/IVC/ILIACS Referring Phys: Rogue Jury ARIDA --------------------------------------------------------------------------------  Risk Factors: Hyperlipidemia, current smoker, coronary artery disease. Vascular Interventions: 10/15/21-Successful drug-eluting stent placement to the                         left external iliac artery and drug-coated balloon                         angioplasty to the distal left common iliac artery.  Comparison Study: 09/25/21-Left external iliac velocity was 325 cm/s. Performing Technologist: Wilkie Aye RVT  Examination Guidelines: A complete evaluation includes B-mode imaging, spectral Doppler, color Doppler, and power Doppler as needed of all accessible portions of each vessel. Bilateral testing is  considered an integral part of a complete examination. Limited examinations for reoccurring  indications may be performed as noted.  Abdominal Aorta Findings: +-------------+-------+----------+----------+--------+--------+--------+ Location     AP (cm)Trans (cm)PSV (cm/s)WaveformThrombusComments +-------------+-------+----------+----------+--------+--------+--------+ Proximal     1.90   2.00      70                                 +-------------+-------+----------+----------+--------+--------+--------+ Mid                           59                                 +-------------+-------+----------+----------+--------+--------+--------+ Distal                        57                                 +-------------+-------+----------+----------+--------+--------+--------+ RT CIA Prox                   167       biphasic                 +-------------+-------+----------+----------+--------+--------+--------+ RT CIA Mid                    193       biphasic                 +-------------+-------+----------+----------+--------+--------+--------+ RT CIA Distal                 90        biphasic                 +-------------+-------+----------+----------+--------+--------+--------+ RT EIA Prox                   155       biphasic                 +-------------+-------+----------+----------+--------+--------+--------+ RT EIA Mid                    182       biphasic                 +-------------+-------+----------+----------+--------+--------+--------+ RT EIA Distal                 179       biphasic                 +-------------+-------+----------+----------+--------+--------+--------+ LT CIA Prox                   108       biphasic                 +-------------+-------+----------+----------+--------+--------+--------+ LT CIA Mid                    167       biphasic                  +-------------+-------+----------+----------+--------+--------+--------+ Mild decrease in left ABI; performed left lower extremity duplex. No significant stenosis noted. IVC/Iliac Findings: +--------+------+   IVC   Patent +--------+------+ IVC Proxpatent +--------+------+   Left Stent(s): +---------------+--------+--------+--------+--------+ Distal CIA/EIA PSV cm/sStenosisWaveformComments +---------------+--------+--------+--------+--------+ Prox to Stent  186  biphasic         +---------------+--------+--------+--------+--------+ Proximal Stent 162             biphasic         +---------------+--------+--------+--------+--------+ Mid Stent      105             biphasic         +---------------+--------+--------+--------+--------+ Distal Stent   87              biphasic         +---------------+--------+--------+--------+--------+ Distal to [id number]             biphasic         +---------------+--------+--------+--------+--------+  Summary: Abdominal Aorta: The largest aortic measurement is 2.0 cm. Stenosis: +--------------------+-------------+-----------+ Location            Stenosis     Stent       +--------------------+-------------+-----------+ Right Common Iliac  <50% stenosis            +--------------------+-------------+-----------+ Left Common Iliac                no stenosis +--------------------+-------------+-----------+ Right External Iliac<50% stenosis            +--------------------+-------------+-----------+ Left External Iliac              no stenosis +--------------------+-------------+-----------+ Atherosclerosis in the aorta and iliac arteries with no focal stenosis, s/p left iliac stent. Mild decrease in left ABI; performed left lower extremity duplex. No significant stenosis noted. IVC/Iliac: There is no evidence of thrombus involving the IVC.  *See table(s) above for measurements and observations. Suggest follow up  study in 6 months.  Electronically signed by Jenkins Rouge MD on 10/30/2021 at 7:56:46 AM.    Final    VAS [place] ABI WITH/WO TBI  Result Date: 10/30/2021  LOWER EXTREMITY DOPPLER STUDY Patient Name:  KELSEI DEFINO  Date of Exam:   10/29/2021 Medical Rec #: [id number]       Accession #:    [id number] Date of Birth: 02-27-1971       Patient Gender: F Patient Age:   47 years Exam Location:  [facility] Procedure:      VAS [place] ABI WITH/WO TBI Referring Phys: [facility] [place] [facility] --------------------------------------------------------------------------------  Indications: Peripheral artery disease. High Risk Factors: Hyperlipidemia, current smoker. Other Factors: Patient states her symptoms have improved since her procedure.                Denies claudication.  Vascular Interventions: 10/15/21-Successful drug-eluting stent placement to the                         left external iliac artery and drug-coated balloon                         angioplasty to the distal left common iliac artery. Comparison Study: 10/15/21-Right 1.07; Light 1.09 Performing Technologist: Wilkie Aye RVT  Examination Guidelines: A complete evaluation includes at minimum, Doppler waveform signals and systolic blood pressure reading at the level of bilateral brachial, anterior tibial, and posterior tibial arteries, when vessel segments are accessible. Bilateral testing is considered an integral part of a complete examination. Photoelectric Plethysmograph (PPG) waveforms and toe systolic pressure readings are included as required and additional duplex testing as needed. Limited examinations for reoccurring indications may be performed as noted.  ABI Findings: +---------+------------------+-----+---------+--------+ Right    Rt Pressure (mmHg)IndexWaveform Comment  +---------+------------------+-----+---------+--------+ Brachial 149                                      +---------+------------------+-----+---------+--------+  PTA      158                1.04 triphasic         +---------+------------------+-----+---------+--------+ PERO     139               0.91 triphasic         +---------+------------------+-----+---------+--------+ DP       140               0.92 triphasic         +---------+------------------+-----+---------+--------+ Great Toe162               1.07 Normal            +---------+------------------+-----+---------+--------+ +---------+------------------+-----+---------+-------+ Left     Lt Pressure (mmHg)IndexWaveform Comment +---------+------------------+-----+---------+-------+ Brachial 152                                     +---------+------------------+-----+---------+-------+ PTA      141               0.93 triphasic        +---------+------------------+-----+---------+-------+ PERO     127               0.85 biphasic         +---------+------------------+-----+---------+-------+ DP       134               0.88 biphasic         +---------+------------------+-----+---------+-------+ Doristine Devoid Toe154               1.01 Abnormal         +---------+------------------+-----+---------+-------+ +-------+-----------+-----------+------------+------------+ ABI/TBIToday's ABIToday's TBIPrevious ABIPrevious TBI +-------+-----------+-----------+------------+------------+ Right  1.04       1.07       1.07        0.60         +-------+-----------+-----------+------------+------------+ Left   0.93       1.01       1.09        0.48         +-------+-----------+-----------+------------+------------+  Right ABIs appear essentially unchanged compared to prior study on 10/2021. Left ABIs appear decreased compared to prior study on 10/2021.  Summary: Right: Resting right ankle-brachial index is within normal range. No evidence of significant right lower extremity arterial disease. The right toe-brachial index is normal. Left: Resting left ankle-brachial index indicates mild left lower  extremity arterial disease. The left toe-brachial index is normal. *See table(s) above for measurements and observations.  Suggest follow up study in 6 months. Electronically signed by Jenkins Rouge MD on 10/30/2021 at 7:52:15 AM.    Final     Labs on Admission: I have personally reviewed following labs  CBC: Recent Labs  Lab 10/30/21 1001  WBC 11.5*  HGB 8.6*  HCT 25.9*  MCV 96.3  PLT 709   Basic Metabolic Panel: Recent Labs  Lab 10/30/21 1001  NA 129*  K 3.7  CL 97*  CO2 19*  GLUCOSE 123*  BUN 32*  CREATININE 0.82  CALCIUM 8.1*  MG 1.9   GFR: Estimated Creatinine Clearance: 59 mL/min (by C-G formula based on SCr of 0.82 mg/dL).  Liver Function Tests: Recent Labs  Lab 10/30/21 1001  AST 34  ALT 18  ALKPHOS 53  BILITOT 0.6  PROT 6.2*  ALBUMIN 3.5   Coagulation Profile: Recent Labs  Lab 10/30/21  1001  INR 1.1   Urine analysis:    Component Value Date/Time   COLORURINE COLORLESS (A) 09/07/2021 1308   APPEARANCEUR CLEAR (A) 09/07/2021 1308   LABSPEC 1.001 (L) 09/07/2021 1308   PHURINE 6.0 09/07/2021 1308   GLUCOSEU NEGATIVE 09/07/2021 1308   HGBUR NEGATIVE 09/07/2021 1308   BILIRUBINUR NEGATIVE 09/07/2021 1308   KETONESUR NEGATIVE 09/07/2021 1308   PROTEINUR NEGATIVE 09/07/2021 1308   NITRITE NEGATIVE 09/07/2021 1308   LEUKOCYTESUR NEGATIVE 09/07/2021 1308   CRITICAL CARE Performed by: Briant Cedar Tatym Schermer  Total critical care time: 35 minutes  Critical care time was exclusive of separately billable procedures and treating other patients.  Critical care was necessary to treat or prevent imminent or life-threatening deterioration.  Critical care was time spent personally by me on the following activities: development of treatment plan with patient and/or surrogate as well as nursing, discussions with consultants, evaluation of patient's response to treatment, examination of patient, obtaining history from patient or surrogate, ordering and performing treatments  and interventions, ordering and review of laboratory studies, ordering and review of radiographic studies, pulse oximetry and re-evaluation of patient's condition.  Dr. Tobie Poet [facility]  If 7PM-7AM, please contact overnight-coverage provider If 7AM-7PM, please contact day coverage provider [email or web address]  10/30/2021, 2:35 PM

## 2021-10-30 NOTE — ED Triage Notes (Addendum)
Pt was medical response from Dr Tyrell Antonio office. Was at office for check up, this am prior to going to appt she had an episode of bloody emesis and had a syncopal episode in which she fell from toilet into floor. In the office she had another episode of bloody emesis and BP drop to 41'N systolic. Pt pale in color. On ASA and Plavix

## 2021-10-30 NOTE — Assessment & Plan Note (Signed)
-   Presumed secondary to alcohol abuse

## 2021-10-30 NOTE — Progress Notes (Signed)
Responded to overhead medical alert for the office. On my arrival the patient was already on stretcher, with cardiac monitor, prepared for transport to the ED. ED staff was present and took patient without episode to room 1 in the ED

## 2021-10-30 NOTE — Assessment & Plan Note (Signed)
-   Status post Protonix bolus and gtt. ordered by EDP - GI has been consulted, we appreciate further recommendations - Maintain hemoglobin greater than 8 - Continue n.p.o. pending GI evaluation - Ensure bilateral large-bore PIV - CBC in the a.m.

## 2021-10-30 NOTE — Assessment & Plan Note (Addendum)
-   POA, left jaw and left side of her face, secondary to traumatic syncope at home

## 2021-10-30 NOTE — Hospital Course (Signed)
[name] is a 51 year old female with history of alcohol abuse, tobacco dependence, CAD status post PCI in 09/10/2021, who presents emergency department for chief concerns of syncope.  Initial vitals in the emergency department showed temperature of 97.9, respiration rate of 19, heart rate of 99, blood pressure 103/69, SPO2 of 100% on room air.  Serum sodium is 129, potassium 3.7, chloride 97, bicarb 19, BUN of 32, serum creatinine of 0.82, GFR greater than 60, nonfasting blood glucose 123, WBC 11.5, hemoglobin 8.6, platelets of 342.  High sensitive troponin was 10.  Lactic acid was 4.2.  ED treatment: Protonix bolus and gtt., LR 1 L bolus.

## 2021-10-30 NOTE — Assessment & Plan Note (Signed)
-   Presumed secondary to upper GI bleed with possible H. pylori/ulcer infection - GI has been consulted

## 2021-10-30 NOTE — ED Provider Notes (Addendum)
[facility] Provider Note    Event Date/Time   First MD Initiated Contact with Patient 10/30/21 1005     (approximate)   History   Loss of Consciousness and Hematemesis   HPI  Sonya Harmon is a 51 y.o. female with PMH history of PAD, CAD status post recent cath aspirin Plavix, tongue cancer, tobacco abuse and EtOH abuse with patient stating she drinks typically 2-3 beers per day most recently yesterday who presents for evaluation after she had a syncopal episode consistent with hematemesis and couple days of epigastric pain and melanotic stools.  She reportedly syncopized earlier today.  She think she hit her head.  She has headache diffuse back pain as well as ongoing pain in her epigastrium.  She states she is also been taking ibuprofen intermittently.  She denies any cough, fevers, shortness of breath, chest pain aside from what she describes as some intermittent burning substernal pain.  He states he has a little soreness in her left elbow but no other acute extremity pain.  She notes she has some scattered bruising on her legs from her dog.  She denies any illicit drug use.  He states he has never had significant GI bleeding before.      Physical Exam  Triage Vital Signs: ED Triage Vitals  Enc Vitals Group     BP 10/30/21 0958 (!) 88/59     Pulse Rate 10/30/21 0958 88     Resp 10/30/21 0958 14     Temp 10/30/21 0958 97.9 F (36.6 C)     Temp Source 10/30/21 0958 Oral     SpO2 10/30/21 0958 100 %     Weight 10/30/21 0959 99 lb 3.3 oz (45 kg)     Height 10/30/21 0959 '5\' 2"'$  (1.575 m)     Head Circumference --      Peak Flow --      Pain Score 10/30/21 0959 0     Pain Loc --      Pain Edu? --      Excl. in [place]? --     Most recent vital signs: Vitals:   10/30/21 1120 10/30/21 1200  BP: 101/65 101/67  Pulse:  76  Resp: 16 11  Temp:    SpO2: 100% 100%    General: Awake, chronically ill-appearing. CV:  Good peripheral perfusion.  2+  bilateral radial and PT pulses. Resp:  Normal effort.  Clear bilaterally. Abd:  No distention.  Tender in epigastrium and left lower quadrant. Other:  Some scattered bruising over the face and extremities.  There are some tenderness on ranging of the left elbow.  She otherwise has full strength and range of motion in the extremities.  Diffuse tenderness along the C4/T and L-spine.  Some bruising over the left eye.   ED Results / Procedures / Treatments  Labs (all labs ordered are listed, but only abnormal results are displayed) Labs Reviewed  COMPREHENSIVE METABOLIC PANEL - Abnormal; Notable for the following components:      Result Value   Sodium 129 (*)    Chloride 97 (*)    CO2 19 (*)    Glucose, Bld 123 (*)    BUN 32 (*)    Calcium 8.1 (*)    Total Protein 6.2 (*)    All other components within normal limits  CBC - Abnormal; Notable for the following components:   WBC 11.5 (*)    RBC 2.69 (*)    Hemoglobin 8.6 (*)  HCT 25.9 (*)    All other components within normal limits  LACTIC ACID, PLASMA - Abnormal; Notable for the following components:   Lactic Acid, Venous 4.2 (*)    All other components within normal limits  CULTURE, BLOOD (ROUTINE X 2)  CULTURE, BLOOD (ROUTINE X 2)  APTT  PROTIME-INR  MAGNESIUM  LACTIC ACID, PLASMA  TYPE AND SCREEN  TROPONIN I (HIGH SENSITIVITY)  TROPONIN I (HIGH SENSITIVITY)     EKG  EKG remarkable for sinus rhythm with a ventricular rate of 83, nonspecific ST change in V2 and aVL without other clear evidence of acute ischemia.  Normal axis and type I QTc interval at 499.  RADIOLOGY CT Head, Face, C-Spine IMPRESSION: 1. No evidence of acute intracranial abnormality or facial fracture. 2. No evidence of acute fracture or traumatic malalignment the cervical spine.  CTA C/A/P IMPRESSION: Chest Impression: 1. No evidence of aortic dissection or aneurysm. 2. Stenosis of the LEFT subclavian artery with re-consultation not changed from  prior. 3. No evidence acute pulmonary embolism. 4. No acute pulmonary parenchymal findings. Abdomen / Pelvis Impression: 1. No acute findings the abdominal aorta or branches. Atherosclerotic calcification again noted. No change in proximal stenosis of the SMA and LEFT common iliac artery compared to CT a 09/07/2021. 2. No acute findings in the abdomen pelvis.  Left elbow x-ray my interpretation without clear fracture.  Pending formal radiology interpretation.  T/L [place] IMPRESSION: No acute fracture or traumatic malalignment of the thoracolumbar spine.    PROCEDURES:  Critical Care performed: Yes, see critical care procedure note(s)  .1-3 Lead EKG Interpretation  Performed by: Lucrezia Starch, MD Authorized by: Lucrezia Starch, MD     Interpretation: normal     ECG rate assessment: normal     Rhythm: sinus rhythm     Ectopy: none     Conduction: normal   .Critical Care  Performed by: Lucrezia Starch, MD Authorized by: Lucrezia Starch, MD   Critical care provider statement:    Critical care time (minutes):  30   Critical care was necessary to treat or prevent imminent or life-threatening deterioration of the following conditions:  Shock   Critical care was time spent personally by me on the following activities:  Development of treatment plan with patient or surrogate, discussions with consultants, evaluation of patient's response to treatment, examination of patient, ordering and review of laboratory studies, ordering and review of radiographic studies, ordering and performing treatments and interventions, pulse oximetry, re-evaluation of patient's condition and review of old charts   The patient is on the cardiac monitor to evaluate for evidence of arrhythmia and/or significant heart rate changes.   MEDICATIONS ORDERED IN ED: Medications  pantoprozole (PROTONIX) 80 mg /NS 100 mL infusion (8 mg/hr Intravenous New Bag/Given 10/30/21 1153)  pantoprazole (PROTONIX) injection  40 mg (has no administration in time range)  pantoprazole (PROTONIX) 80 mg /NS 100 mL IVPB (0 mg Intravenous Stopped 10/30/21 1138)  lactated ringers bolus 1,000 mL (0 mLs Intravenous Stopped 10/30/21 1155)  iohexol (OMNIPAQUE) 350 MG/ML injection 100 mL (100 mLs Intravenous Contrast Given 10/30/21 1049)     IMPRESSION / MDM / ASSESSMENT AND PLAN / ED COURSE  I reviewed the triage vital signs and the nursing notes. Patient's presentation is most consistent with acute presentation with potential threat to life or bodily function.  Differential diagnosis includes, but is not limited to syncope secondary to acute blood loss anemia from hematemesis either from upper or lower GI bleed, sepsis, arrhythmia, dehydration, orthostasis vasovagal syncope.  I am also concerned for possible spinal injury, facial fracture, intracranial hemorrhage or skull fracture or left elbow fracture.  She is otherwise neurovascular intact.  She has no history of seizures and does not seem to be in acute withdrawal at this time.  EKG remarkable for sinus rhythm with a ventricular rate of 83, nonspecific ST change in V2 and aVL without other clear evidence of acute ischemia.  Normal axis and type I QTc interval at 499.  CT Head, Face, C-Spine IMPRESSION: 1. No evidence of acute intracranial abnormality or facial fracture. 2. No evidence of acute fracture or traumatic malalignment the cervical spine.  CTA C/A/P IMPRESSION: Chest Impression: 1. No evidence of aortic dissection or aneurysm. 2. 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 3. No evidence acute pulmonary embolism. 4. No acute pulmonary parenchymal findings. Abdomen / Pelvis Impression: 1. No acute findings the abdominal aorta or branches. Atherosclerotic calcification again noted. No change in proximal stenosis of the SMA and LEFT common iliac artery compared to CT a 09/07/2021. 2. No acute findings  in the abdomen pelvis.  T/L [place] IMPRESSION: No acute fracture or traumatic malalignment of the thoracolumbar spine.   X-ray of the left elbow on my interpretation without gross fracture.  Pending formal radiology interpretation at time of admission  CMP is remarkable for bicarb of 19, BUN of 32 and a sodium of 129 without any other significant electrolyte or metabolic derangements.  CBC shows WC, 11.5, hemoglobin 8.6 compared to 10.72 weeks ago and normal platelets.  PTT is 25.  INR 1.1.  Troponin nonelevated 10 and overall, and EKG in absence of chest pain is not suggestive of ACS at this time.  Museum 1.9.  Actiq acid elevated at 4.2.  I suspect this represents some hypoperfusion from acute blood loss anemia from patient's hematemesis.  However out of caution we will send blood cultures as well.  We will give some IV fluids.  Patient had a type and screen sent as well.  Given patient presents with hematemesis with initially low blood pressures I did consult with on-call gastroenterologist and spoke with Dr. Marius Ditch who will come evaluate patient.  I will admit to medicine service for further evaluation and management.  I will also give her a small dose of Ativan for some nausea and vomiting emergency room given her prolonged QTc interval.    FINAL CLINICAL IMPRESSION(S) / ED DIAGNOSES   Final diagnoses:  Syncope and collapse  Hematemesis, unspecified whether nausea present  ETOH abuse  Fall, initial encounter  Lactic acid acidosis  Low hemoglobin     Rx / DC Orders   ED Discharge Orders     None        Note:  This document was prepared using Dragon voice recognition software and may include unintentional dictation errors.   Lucrezia Starch, MD 10/30/21 1241    Lucrezia Starch, MD 10/30/21 1250    Lucrezia Starch, MD 10/30/21 1440

## 2021-10-30 NOTE — ED Notes (Addendum)
Lab at bedside at this time.  

## 2021-10-30 NOTE — Assessment & Plan Note (Signed)
-   Patient quit working in June 2023

## 2021-10-30 NOTE — Consult Note (Signed)
Cardiology Consultation:   Patient ID: Sonya Harmon; [id number]; November 05, 1970   Admit date: 10/30/2021 Date of Consult: 10/30/2021  Primary Care Provider: Leonides Sake, MD Primary Cardiologist: Fletcher Anon Primary Electrophysiologist:  None   Patient Profile:   Sonya Harmon is a 51 y.o. female with a hx of CAD status post PCI to the RCA in 09/2021, PAD status post stenting to the left external iliac artery with drug-coated balloon angioplasty to the distal left common iliac artery, tongue cancer, HLD, tobacco use, alcohol use  who is being seen today for the evaluation of DAPT in the context of hematemesis at the request of Dr. Tobie Poet.  History of Present Illness:   Sonya Harmon was admitted to the hospital and 09/2020 with unstable angina and underwent cardiac cath by Dr. Humphrey Rolls from the right femoral artery which showed severe mid RCA stenosis.  PCI was planned, but the patient had a large groin hematoma.  She was noted to have significant bilateral iliac disease.  She underwent staged PCI via the right radial artery.  She was seen on 10/10/2021 after a syncopal episode.  She was sent to the ED for evaluation.  Troponin was negative x2.  D-dimer was normal.  She was given a liter of fluid and discharged home.  Labs at that time showed a stable anemia with a hemoglobin of 10.7.  She was noted to have significant bilateral lower extremity claudication due to iliac disease.  She underwent lower extremity angiography on 10/15/2021 which showed severely calcified aorta with no obstructive disease.  There was severe ostial left renal artery stenosis, no significant iliac disease on the right side, and severe left external iliac artery stenosis extending into the distal left common iliac artery.  She underwent successful drug-eluting stent placement to the left external iliac artery and drug-coated balloon angioplasty to the distal left common iliac artery.  A stent was not placed in an effort to avoid jailing  of the internal iliac artery which was severely diseased at the ostium.  Postprocedure duplex showed abnormal iliac velocities.  Following this, her claudication resolved.   She was seen in our office earlier this morning and reported not feeling well over the preceding 3 days with increased dizziness, poor oral intake, abdominal pain, emesis, and dark stools.  She also reported having recently vomited dark fluid.  Prior to her visit this morning, she reported having had a syncopal episode at home in her bathroom, while having a BM, hitting her head.  She subsequently drove to our office for her previously scheduled appointment. In our office, she was reported to "look pale" and was not feeling well.  Blood pressure lying down was 86/61 with a standing up blood pressure of 72/48.  EKG in the office showed sinus rhythm with nonspecific ST-T changes with no evidence of ST elevation.  In the office, the patient had large dark bloody emesis in the office associated with presyncope.  Rapid response was called and she was transferred to the ED. Upon arrival to the ED, BP was 88/59 sitting.  Oxygen saturation 100% on room air.  She was afebrile. She denied any chest pain, dyspnea, or palpitations. She reported her abdomen felt "different" with pain that radiated through to her back. Imaging: CT head/cervical spine/maxillofacial/T-spine/L-spine without acute fracture or intracranial process. CTA chest/aorta was negative for aortic dissection or aneurysm or PE. Labs: High-sensitivity troponin 10 with a delta troponin of 11, Hgb 8.6 with a baseline around 10.7, WBC  11.5, PLT 342, sodium 129, potassium 3.7, BUN 32, serum creatinine 0.82. In the ER, she received lactated Ringer's and IV Protonix. She is currently without symptoms of angina or decompensation and is beginning to note an improvement in her abdominal discomfort. She does report taking ibuprofen daily for the past month due to headache.       Past Medical  History:  Diagnosis Date   Alcohol abuse    Coronary artery disease 09/2021   s/p PCI to RCA in setting of unstable angina   History of tongue cancer    PAD (peripheral artery disease) (HCC)    Tobacco abuse     Past Surgical History:  Procedure Laterality Date   ABDOMINAL AORTOGRAM W/LOWER EXTREMITY N/A 10/15/2021   Procedure: ABDOMINAL AORTOGRAM W/LOWER EXTREMITY;  Surgeon: Wellington Hampshire, MD;  Location: [facility];  Service: Cardiovascular;  Laterality: N/A;   BILATERAL SALPINGECTOMY     CORONARY BALLOON ANGIOPLASTY N/A 09/08/2021   Procedure: CORONARY BALLOON ANGIOPLASTY - ABORTED;  Surgeon: Wellington Hampshire, MD;  Location: [facility];  Service: Cardiovascular;  Laterality: N/A;   CORONARY STENT INTERVENTION N/A 09/10/2021   Procedure: CORONARY STENT INTERVENTION;  Surgeon: Wellington Hampshire, MD;  Location: [facility];  Service: Cardiovascular;  Laterality: N/A;   LEFT HEART CATH AND CORONARY ANGIOGRAPHY N/A 09/08/2021   Procedure: LEFT HEART CATH AND CORONARY ANGIOGRAPHY;  Surgeon: Dionisio David, MD;  Location: [facility];  Service: Cardiovascular;  Laterality: N/A;   PERIPHERAL VASCULAR INTERVENTION  10/15/2021   Procedure: PERIPHERAL VASCULAR INTERVENTION;  Surgeon: Wellington Hampshire, MD;  Location: [facility];  Service: Cardiovascular;;   TONGUE SURGERY       Home Meds: Prior to Admission medications   Medication Sig Start Date End Date Taking? Authorizing Provider  aspirin 81 MG chewable tablet Chew 1 tablet (81 mg total) by mouth daily. 09/11/21 09/11/22 Yes Enzo Bi, MD  atorvastatin (LIPITOR) 80 MG tablet Take 1 tablet (80 mg total) by mouth daily. 09/11/21 12/10/21 Yes Enzo Bi, MD  clopidogrel (PLAVIX) 75 MG tablet Take 1 tablet (75 mg total) by mouth daily. 09/11/21 09/11/22 Yes Enzo Bi, MD  metoprolol tartrate (LOPRESSOR) 25 MG tablet Take 0.5 tablets (12.5 mg total) by mouth 2 (two) times daily. 09/11/21 12/10/21 Yes Enzo Bi, MD     Inpatient Medications: Scheduled Meds:  [START ON 10/31/2021] atorvastatin  80 mg Oral QHS   folic acid  1 mg Oral Daily   multivitamin with minerals  1 tablet Oral Daily   [START ON 11/02/2021] pantoprazole  40 mg Intravenous Q12H   thiamine  100 mg Oral Daily   Or   thiamine  100 mg Intravenous Daily   Continuous Infusions:  sodium chloride     pantoprazole 8 mg/hr (10/30/21 1153)   PRN Meds: acetaminophen **OR** acetaminophen, LORazepam **OR** LORazepam, midodrine, morphine injection, ondansetron **OR** ondansetron (ZOFRAN) IV, senna-docusate  Allergies:   Allergies  Allergen Reactions   Amoxicil &Clarithro &Lansopraz Nausea And Vomiting   Amoxicillin Rash    Social History:   Social History   Socioeconomic History   Marital status: Married    Spouse name: Not on file   Number of children: Not on file   Years of education: Not on file   Highest education level: Not on file  Occupational History   Not on file  Tobacco Use   Smoking status: Former    Packs/day: 0.50    Types:  Cigarettes    Quit date: 09/07/2021    Years since quitting: 0.1   Smokeless tobacco: Former  Scientific laboratory technician Use: Former  Substance and Sexual Activity   Alcohol use: Not Currently    Alcohol/week: 42.0 standard drinks of alcohol    Types: 42 Cans of beer per week   Drug use: Not Currently    Types: Marijuana   Sexual activity: Yes    Partners: Male  Other Topics Concern   Not on file  Social History Narrative   ** Merged History Encounter **       Social Determinants of Health   Financial Resource Strain: Not on file  Food Insecurity: Not on file  Transportation Needs: Not on file  Physical Activity: Not on file  Stress: Not on file  Social Connections: Not on file  Intimate Partner Violence: Not on file     Family History:   Family History  Problem Relation Age of Onset   Cancer Mother    Cancer Father    Stroke Sister    Aneurysm Sister     ROS:  Review of  Systems  Constitutional:  Positive for malaise/fatigue. Negative for chills, diaphoresis, fever and weight loss.  HENT:  Negative for congestion.   Eyes:  Negative for discharge and redness.  Respiratory:  Negative for cough, sputum production, shortness of breath and wheezing.   Cardiovascular:  Negative for chest pain, palpitations, orthopnea, claudication, leg swelling and PND.  Gastrointestinal:  Positive for abdominal pain, constipation, melena, nausea and vomiting. Negative for blood in stool, diarrhea and heartburn.  Musculoskeletal:  Positive for back pain, falls, joint pain and neck pain. Negative for myalgias.  Skin:  Negative for rash.  Neurological:  Positive for loss of consciousness and weakness. Negative for dizziness, tingling, tremors, sensory change, speech change and focal weakness.  Endo/Heme/Allergies:  Does not bruise/bleed easily.  Psychiatric/Behavioral:  Negative for substance abuse. The patient is not nervous/anxious.   All other systems reviewed and are negative.     Physical Exam/Data:   Vitals:   10/30/21 0959 10/30/21 1000 10/30/21 1120 10/30/21 1200  BP:  103/69 101/65 101/67  Pulse:  99  76  Resp:  '19 16 11  '$ Temp:      TempSrc:      SpO2:  100% 100% 100%  Weight: 45 kg     Height: '5\' 2"'$  (1.575 m)       Intake/Output Summary (Last 24 hours) at 10/30/2021 1430 Last data filed at 10/30/2021 1155 Gross per 24 hour  Intake 1095.32 ml  Output --  Net 1095.32 ml   Filed Weights   10/30/21 0959  Weight: 45 kg   Body mass index is 18.15 kg/m.   Physical Exam: General: Well developed, well nourished, in no acute distress. Head: Normocephalic, contusion along the left mandible with some STS, sclera non-icteric, no xanthomas, nares without discharge.  Neck: Negative for carotid bruits. JVD not elevated. Lungs: Clear bilaterally to auscultation without wheezes, rales, or rhonchi. Breathing is unlabored. Heart: RRR with S1 S2. No murmurs, rubs, or  gallops appreciated. Abdomen: Soft, non-tender, non-distended with normoactive bowel sounds. No hepatomegaly. No rebound/guarding. No obvious abdominal masses. Msk:  Strength and tone appear normal for age. Extremities: No clubbing or cyanosis. No edema. Distal pedal pulses are 2+ and equal bilaterally. Neuro: Alert and oriented X 3. No facial asymmetry. No focal deficit. Moves all extremities spontaneously. Psych:  Responds to questions appropriately with a normal affect.  EKG:  The EKG was personally reviewed and demonstrates: NSR, 83 bpm, nonspecific st/t changes  Telemetry:  Telemetry was personally reviewed and demonstrates: SR  Weights: [employer]   10/30/21 0959  Weight: 45 kg    Relevant CV Studies: As above  Laboratory Data:  Chemistry Recent Labs  Lab 10/30/21 1001  NA 129*  K 3.7  CL 97*  CO2 19*  GLUCOSE 123*  BUN 32*  CREATININE 0.82  CALCIUM 8.1*  GFRNONAA >60  ANIONGAP 13    Recent Labs  Lab 10/30/21 1001  PROT 6.2*  ALBUMIN 3.5  AST 34  ALT 18  ALKPHOS 53  BILITOT 0.6   Hematology Recent Labs  Lab 10/30/21 1001  WBC 11.5*  RBC 2.69*  HGB 8.6*  HCT 25.9*  MCV 96.3  MCH 32.0  MCHC 33.2  RDW 13.8  PLT 342   Cardiac EnzymesNo results for input(s): "TROPONINI" in the last 168 hours. No results for input(s): "TROPIPOC" in the last 168 hours.  BNPNo results for input(s): "BNP", "PROBNP" in the last 168 hours.  DDimer No results for input(s): "DDIMER" in the last 168 hours.  Radiology/Studies:  CT T-SPINE NO CHARGE  Result Date: 10/30/2021 IMPRESSION: No acute fracture or traumatic malalignment of the thoracolumbar spine. Electronically Signed   By: Valetta Mole M.D.   On: 10/30/2021 11:51   CT L-SPINE NO CHARGE  Result Date: 10/30/2021 IMPRESSION: No acute fracture or traumatic malalignment of the thoracolumbar spine. Electronically Signed   By: Valetta Mole M.D.   On: 10/30/2021 11:51   CT Angio Chest/Abd/Pel for Dissection W  and/or Wo Contrast  Result Date: 10/30/2021 IMPRESSION: Chest Impression: 1. No evidence of aortic dissection or aneurysm. 2. 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 3. No evidence acute pulmonary embolism. 4. No acute pulmonary parenchymal findings. Abdomen / Pelvis Impression: 1. No acute findings the abdominal aorta or branches. Atherosclerotic calcification again noted. No change in proximal stenosis of the SMA and LEFT common iliac artery compared to CT a 09/07/2021. 2. No acute findings in the abdomen pelvis. Electronically Signed   By: Suzy Bouchard M.D.   On: 10/30/2021 11:39   CT HEAD WO CONTRAST (5MM)  Result Date: 10/30/2021 IMPRESSION: 1. No evidence of acute intracranial abnormality or facial fracture. 2. No evidence of acute fracture or traumatic malalignment the cervical spine. Electronically Signed   By: Margaretha Sheffield M.D.   On: 10/30/2021 11:28   CT Cervical Spine Wo Contrast  Result Date: 10/30/2021 IMPRESSION: 1. No evidence of acute intracranial abnormality or facial fracture. 2. No evidence of acute fracture or traumatic malalignment the cervical spine. Electronically Signed   By: Margaretha Sheffield M.D.   On: 10/30/2021 11:28   CT Maxillofacial Wo Contrast  Result Date: 10/30/2021 IMPRESSION: 1. No evidence of acute intracranial abnormality or facial fracture. 2. No evidence of acute fracture or traumatic malalignment the cervical spine. Electronically Signed   By: Margaretha Sheffield M.D.   On: 10/30/2021 11:28   DG Elbow Complete Left  Result Date: 10/30/2021 CLINICAL DATA:  Syncopal episode.  Fell.  Left elbow pain. EXAM: LEFT ELBOW - COMPLETE 3+ VIEW COMPARISON:  None Available. FINDINGS: The joint spaces are maintained. No acute elbow fracture. No osteochondral lesion. No joint effusion. IMPRESSION: No acute bony findings. Electronically Signed   By: Marijo Sanes M.D.   On: 10/30/2021 10:37   VAS US  AORTA/IVC/ILIACS  Result Date: 10/30/2021 ABIToday's TBIPrevious ABIPrevious TBI +-------+-----------+-----------+------------+------------+  Right  1.04       1.07       1.07        0.60         +-------+-----------+-----------+------------+------------+ Left   0.93       1.01       1.09        0.48         +-------+-----------+-----------+------------+------------+  Right ABIs appear essentially unchanged compared to prior study on 10/2021. Left ABIs appear decreased compared to prior study on 10/2021.  Summary: Right: Resting right ankle-brachial index is within normal range. No evidence of significant right lower extremity arterial disease. The right toe-brachial index is normal. Left: Resting left ankle-brachial index indicates mild left lower extremity arterial disease. The left toe-brachial index is normal. *See table(s) above for measurements and observations.  Suggest follow up study in 6 months. Electronically signed by Jenkins Rouge MD on 10/30/2021 at 7:52:15 AM.    Final     Assessment and Plan:   1. Syncope with hypovolemic hypotension:  -Likely in the context of volume depletion, poor oral intake, and acute blood loss anemia  -Hemodynamically stable -Monitor on telemetry  -Obtain echo   2. Hematemesis with acute blood loss anemia:  -Hold ASA/Plavix  -IV PPI  -Avoid NSAIDs, has been taking ibuprofen daily for the past month -Possibly exacerbated by alcohol use  -Maintain hemoglobin greater than 8.0  -If endoscopy is needed, she may proceed from a cardiac perspective at an overall moderate risk without further testing -Appreciate GI assistance   3. CAD involving the native coronary arteries status post recent PCI to the RCA in 09/2021:  -No symptoms of angina or decompensation  -High sensitivity troponin negative x2, EKG without acute ischemic changes  -DAPT on hold given concern for acute GI bleed  -Interventional cardiology recommends resuming clopidogrel without  aspirin in 1 to 2 days, or when safe to do so from a GI perspective  -Hold metoprolol for now with hypotension  -No plans for inpatient ischemic testing  4. PAD:  -Status post recent lower extremity stenting and drug-coated balloon angioplasty  -Hold antiplatelet therapy with GI bleed, plan is to resume clopidogrel without aspirin in 1 to 2 days, only when safe to do so   5. HLD:  -Target LDL less than 70  -PTA atorvastatin 80 mg when taking oral medications       For questions or updates, please contact [facility] Please consult [email or web address] for contact info under Cardiology/STEMI.   Signed, Christell Faith, PA-C [facility] Pager: [phone] 10/30/2021, 2:30 PM

## 2021-10-30 NOTE — Consult Note (Addendum)
[name], MD [address]  [place]  [place], [place] [address]  Main: [phone]  Fax: [phone] Pager: [phone]   Consultation  Referring Provider:     No ref. provider found Primary Care Physician:  Leonides Sake, MD Primary Gastroenterologist: Althia Forts       Reason for Consultation: Coffee-ground emesis, acute blood loss anemia  Date of Admission:  10/30/2021 Date of Consultation:  10/30/2021         HPI:   Sonya Harmon is a 51 y.o. female with history of heavy tobacco use, alcohol use, coronary artery disease, peripheral artery disease, recent PCI to RCA in 09/2021, left iliac artery angioplasty in 10/2021 on DAPT presented with syncope in setting of acute upper GI bleed as coffee-ground emesis.  Patient apparently went to cardiology clinic this morning for her appointment with Dr. Cicero Duck.  She was found to be hypotensive, pale and had an episode of coffee-ground emesis.  In the ER, patient had 2-3 further episodes of coffee-ground emesis.  She was hypotensive, aggressively resuscitated with IV fluids, hemoglobin was found to be 8.6 dropped from 10.  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  Patient reports that she stopped smoking about a month ago.  However, continues to drink alcohol daily.  She had elevated BUN/creatinine 32/0.82, normal LFTs.  Patient denies any abdominal pain, black stools.  She feels nauseous but denies any vomiting.  She denies any previous episodes.  She denies any heartburn, not on any PPI on a regular basis.  Her last dose of Plavix was yesterday evening.  Patient is babysitting her granddaughter along with her husband.  Her daughter and son-in-law are on vacation and are out of town.   NSAIDs: Ibuprofen for chronic pain  Antiplts/Anticoagulants/Anti thrombotics: Aspirin and Plavix for history of coronary artery disease and peripheral artery disease  GI Procedures:  None  Past Medical History:  Diagnosis Date   Alcohol abuse    Coronary artery disease 09/2021   s/p PCI to RCA in setting of unstable angina   History of tongue cancer    PAD (peripheral artery disease) (HCC)    Tobacco abuse     Past Surgical History:  Procedure Laterality Date   ABDOMINAL AORTOGRAM W/LOWER EXTREMITY N/A 10/15/2021   Procedure: ABDOMINAL AORTOGRAM W/LOWER EXTREMITY;  Surgeon: Wellington Hampshire, MD;  Location: [facility];  Service: Cardiovascular;  Laterality: N/A;   BILATERAL SALPINGECTOMY     CORONARY BALLOON ANGIOPLASTY N/A 09/08/2021   Procedure: CORONARY BALLOON ANGIOPLASTY - ABORTED;  Surgeon: Wellington Hampshire, MD;  Location: [facility];  Service: Cardiovascular;  Laterality: N/A;   CORONARY STENT INTERVENTION N/A 09/10/2021   Procedure: CORONARY STENT INTERVENTION;  Surgeon: Wellington Hampshire, MD;  Location: [facility];  Service: Cardiovascular;  Laterality: N/A;   LEFT HEART CATH AND CORONARY ANGIOGRAPHY N/A 09/08/2021   Procedure: LEFT HEART CATH AND CORONARY ANGIOGRAPHY;  Surgeon: Dionisio David, MD;  Location: [facility];  Service: Cardiovascular;  Laterality: N/A;   PERIPHERAL VASCULAR INTERVENTION  10/15/2021   Procedure: PERIPHERAL VASCULAR INTERVENTION;  Surgeon: Wellington Hampshire, MD;  Location: [facility];  Service: Cardiovascular;;   TONGUE SURGERY       Current Facility-Administered Medications:    0.9 %  sodium chloride infusion, , Intravenous, Continuous, Cox, Amy N, DO, Last Rate: 125 mL/hr at 10/30/21 1644, New Bag  at 10/30/21 1644   acetaminophen (TYLENOL) tablet 650 mg, 650 mg, Oral, Q6H PRN **OR** acetaminophen (TYLENOL) suppository 650 mg, 650 mg, Rectal, Q6H PRN, Cox, Amy N, DO   [START ON 10/31/2021] atorvastatin (LIPITOR) tablet 80 mg, 80 mg, Oral, QHS, Cox, Amy N, DO   folic acid (FOLVITE) tablet 1 mg, 1 mg, Oral, Daily, Cox, Amy N, DO   LORazepam (ATIVAN) tablet 1-4 mg, 1-4 mg, Oral, Q1H PRN  **OR** LORazepam (ATIVAN) injection 1-4 mg, 1-4 mg, Intravenous, Q1H PRN, Cox, Amy N, DO   midodrine (PROAMATINE) tablet 10 mg, 10 mg, Oral, PRN, Cox, Amy N, DO   morphine (PF) 2 MG/ML injection 2 mg, 2 mg, Intravenous, Q3H PRN, Cox, Amy N, DO   multivitamin with minerals tablet 1 tablet, 1 tablet, Oral, Daily, Cox, Amy N, DO   ondansetron (ZOFRAN) tablet 4 mg, 4 mg, Oral, Q6H PRN **OR** ondansetron (ZOFRAN) injection 4 mg, 4 mg, Intravenous, Q6H PRN, Cox, Amy N, DO   [START ON 11/02/2021] pantoprazole (PROTONIX) injection 40 mg, 40 mg, Intravenous, Q12H, Cox, Amy N, DO   pantoprozole (PROTONIX) 80 mg /NS 100 mL infusion, 8 mg/hr, Intravenous, Continuous, Cox, Amy N, DO, Last Rate: 10 mL/hr at 10/30/21 2051, 8 mg/hr at 10/30/21 2051   senna-docusate (Senokot-S) tablet 1 tablet, 1 tablet, Oral, QHS PRN, Cox, Amy N, DO   thiamine (VITAMIN B1) tablet 100 mg, 100 mg, Oral, Daily **OR** thiamine (VITAMIN B1) injection 100 mg, 100 mg, Intravenous, Daily, Cox, Amy N, DO, 100 mg at 10/30/21 1320  Current Outpatient Medications:    aspirin 81 MG chewable tablet, Chew 1 tablet (81 mg total) by mouth daily., Disp: , Rfl:    atorvastatin (LIPITOR) 80 MG tablet, Take 1 tablet (80 mg total) by mouth daily., Disp: 30 tablet, Rfl: 2   clopidogrel (PLAVIX) 75 MG tablet, Take 1 tablet (75 mg total) by mouth daily., Disp: 30 tablet, Rfl: 11   metoprolol tartrate (LOPRESSOR) 25 MG tablet, Take 0.5 tablets (12.5 mg total) by mouth 2 (two) times daily., Disp: 30 tablet, Rfl: 2   Family History  Problem Relation Age of Onset   Cancer Mother    Cancer Father    Stroke Sister    Aneurysm Sister      Social History   Tobacco Use   Smoking status: Former    Packs/day: 0.50    Types: Cigarettes    Quit date: 09/07/2021    Years since quitting: 0.1   Smokeless tobacco: Former  Scientific laboratory technician Use: Former  Substance Use Topics   Alcohol use: Not Currently    Alcohol/week: 42.0 standard drinks of alcohol     Types: 42 Cans of beer per week   Drug use: Not Currently    Types: Marijuana    Allergies as of 10/30/2021 - Review Complete 10/30/2021  Allergen Reaction Noted   Amoxicil &clarithro &lansopraz Nausea And Vomiting 11/22/2015   Amoxicillin Rash 09/07/2021    Review of Systems:    All systems reviewed and negative except where noted in HPI.   Physical Exam:  Vital signs in last 24 hours: Temp:  [97.9 F (36.6 C)] 97.9 F (36.6 C) (07/27 0958) Pulse Rate:  [88-99] 99 (07/27 1000) Resp:  [14-19] 16 (07/27 1120) BP: (88-110)/(59-69) 101/65 (07/27 1120) SpO2:  [96 %-100 %] 100 % (07/27 1120) Weight:  [45 kg-46 kg] 45 kg (07/27 0959)   General:   Pleasant, cooperative in NAD Head:  Normocephalic and atraumatic. Eyes:  No icterus.   Conjunctiva pink. PERRLA. Ears:  Normal auditory acuity. Neck:  Supple; no masses or thyroidomegaly Lungs: Respirations even and unlabored. Lungs clear to auscultation bilaterally.   No wheezes, crackles, or rhonchi.  Heart:  Regular rate and rhythm;  Without murmur, clicks, rubs or gallops Abdomen:  Soft, nondistended, nontender. Normal bowel sounds. No appreciable masses or hepatomegaly.  No rebound or guarding.  Rectal:  Not performed. Msk:  Symmetrical without gross deformities.  Strength generalized weakness Extremities:  Without edema, cyanosis or clubbing. Neurologic:  Alert and oriented x3;  grossly normal neurologically. Skin:  Intact without significant lesions or rashes. Psych:  Alert and cooperative. Normal affect.  LAB RESULTS:    Latest Ref Rng & Units 10/30/2021   10:01 AM 10/10/2021    4:20 PM 09/30/2021   10:18 AM  CBC  WBC 4.0 - 10.5 K/uL 11.5  7.9  9.5   Hemoglobin 12.0 - 15.0 g/dL 8.6  10.7  10.6   Hematocrit 36.0 - 46.0 % 25.9  32.3  32.6   Platelets 150 - 400 K/uL 342  414  587     BMET    Latest Ref Rng & Units 10/30/2021   10:01 AM 10/10/2021    4:20 PM 09/30/2021   10:18 AM  BMP  Glucose 70 - 99 mg/dL 123  74  95    BUN 6 - 20 mg/dL 32  10  10   Creatinine 0.44 - 1.00 mg/dL 0.82  0.60  0.71   Sodium 135 - 145 mmol/L 129  133  138   Potassium 3.5 - 5.1 mmol/L 3.7  4.7  4.3   Chloride 98 - 111 mmol/L 97  101  103   CO2 22 - 32 mmol/L 19  19  24    Calcium 8.9 - 10.3 mg/dL 8.1  9.3  9.7     LFT    Latest Ref Rng & Units 10/30/2021   10:01 AM 09/12/2021   11:08 PM 09/07/2021   11:25 AM  Hepatic Function  Total Protein 6.5 - 8.1 g/dL 6.2  7.6  8.2   Albumin 3.5 - 5.0 g/dL 3.5  4.2  4.7   AST 15 - 41 U/L 34  31  121   ALT 0 - 44 U/L 18  25  74   Alk Phosphatase 38 - 126 U/L 53  72  76   Total Bilirubin 0.3 - 1.2 mg/dL 0.6  0.8  1.0      STUDIES: VAS US AORTA/IVC/ILIACS  Result Date: 10/30/2021 ABDOMINAL AORTA STUDY Patient Name:  Sonya Harmon  Date of Exam:   10/29/2021 Medical Rec #: [id number]       Accession #:    [id number] Date of Birth: 02-03-1971       Patient Gender: F Patient Age:   89 years Exam Location:  [facility] Procedure:      VAS US AORTA/IVC/ILIACS Referring Phys: Rogue Jury ARIDA --------------------------------------------------------------------------------  Risk Factors: Hyperlipidemia, current smoker, coronary artery disease. Vascular Interventions: 10/15/21-Successful drug-eluting stent placement to the                         left external iliac artery and drug-coated balloon                         angioplasty to the distal left common iliac artery.  Comparison Study: 09/25/21-Left external iliac velocity was 325 cm/s. Performing Technologist: Wilkie Aye RVT  Examination Guidelines: A complete evaluation includes B-mode imaging, spectral Doppler, color Doppler, and power Doppler as needed of all accessible portions of each vessel. Bilateral testing is considered an integral part of a complete examination. Limited examinations for reoccurring indications may be performed as noted.  Abdominal Aorta Findings: +-------------+-------+----------+----------+--------+--------+--------+ Location      AP (cm)Trans (cm)PSV (cm/s)WaveformThrombusComments +-------------+-------+----------+----------+--------+--------+--------+ Proximal     1.90   2.00      70                                 +-------------+-------+----------+----------+--------+--------+--------+ Mid                           59                                 +-------------+-------+----------+----------+--------+--------+--------+ Distal                        57                                 +-------------+-------+----------+----------+--------+--------+--------+ RT CIA Prox                   167       biphasic                 +-------------+-------+----------+----------+--------+--------+--------+ RT CIA Mid                    193       biphasic                 +-------------+-------+----------+----------+--------+--------+--------+ RT CIA Distal                 90        biphasic                 +-------------+-------+----------+----------+--------+--------+--------+ RT EIA Prox                   155       biphasic                 +-------------+-------+----------+----------+--------+--------+--------+ RT EIA Mid                    182       biphasic                 +-------------+-------+----------+----------+--------+--------+--------+ RT EIA Distal                 179       biphasic                 +-------------+-------+----------+----------+--------+--------+--------+ LT CIA Prox                   108       biphasic                 +-------------+-------+----------+----------+--------+--------+--------+ LT CIA Mid                    167       biphasic                 +-------------+-------+----------+----------+--------+--------+--------+ Mild decrease in left  ABI; performed left lower extremity duplex. No significant stenosis noted. IVC/Iliac Findings: +--------+------+   IVC   Patent +--------+------+ IVC Proxpatent +--------+------+    Left Stent(s): +---------------+--------+--------+--------+--------+ Distal CIA/EIA PSV cm/sStenosisWaveformComments +---------------+--------+--------+--------+--------+ Prox to Stent  186             biphasic         +---------------+--------+--------+--------+--------+ Proximal Stent 162             biphasic         +---------------+--------+--------+--------+--------+ Mid Stent      105             biphasic         +---------------+--------+--------+--------+--------+ Distal Stent   87              biphasic         +---------------+--------+--------+--------+--------+ Distal to [id number]             biphasic         +---------------+--------+--------+--------+--------+  Summary: Abdominal Aorta: The largest aortic measurement is 2.0 cm. Stenosis: +--------------------+-------------+-----------+ Location            Stenosis     Stent       +--------------------+-------------+-----------+ Right Common Iliac  <50% stenosis            +--------------------+-------------+-----------+ Left Common Iliac                no stenosis +--------------------+-------------+-----------+ Right External Iliac<50% stenosis            +--------------------+-------------+-----------+ Left External Iliac              no stenosis +--------------------+-------------+-----------+ Atherosclerosis in the aorta and iliac arteries with no focal stenosis, s/p left iliac stent. Mild decrease in left ABI; performed left lower extremity duplex. No significant stenosis noted. IVC/Iliac: There is no evidence of thrombus involving the IVC.  *See table(s) above for measurements and observations. Suggest follow up study in 6 months.  Electronically signed by Jenkins Rouge MD on 10/30/2021 at 7:56:46 AM.    Final    VAS [place] ABI WITH/WO TBI  Result Date: 10/30/2021  LOWER EXTREMITY DOPPLER STUDY Patient Name:  Sonya Harmon  Date of Exam:   10/29/2021 Medical Rec #: [id number]       Accession #:     [id number] Date of Birth: Jul 28, 1970       Patient Gender: F Patient Age:   74 years Exam Location:  [facility] Procedure:      VAS [place] ABI WITH/WO TBI Referring Phys: [facility] ARIDA --------------------------------------------------------------------------------  Indications: Peripheral artery disease. High Risk Factors: Hyperlipidemia, current smoker. Other Factors: Patient states her symptoms have improved since her procedure.                Denies claudication.  Vascular Interventions: 10/15/21-Successful drug-eluting stent placement to the                         left external iliac artery and drug-coated balloon                         angioplasty to the distal left common iliac artery. Comparison Study: 10/15/21-Right 1.07; Light 1.09 Performing Technologist: Wilkie Aye RVT  Examination Guidelines: A complete evaluation includes at minimum, Doppler waveform signals and systolic blood pressure reading at the level of bilateral brachial, anterior tibial, and posterior tibial arteries, when vessel segments are accessible. Bilateral testing is considered an integral part  of a complete examination. Photoelectric Plethysmograph (PPG) waveforms and toe systolic pressure readings are included as required and additional duplex testing as needed. Limited examinations for reoccurring indications may be performed as noted.  ABI Findings: +---------+------------------+-----+---------+--------+ Right    Rt Pressure (mmHg)IndexWaveform Comment  +---------+------------------+-----+---------+--------+ Brachial 149                                      +---------+------------------+-----+---------+--------+ PTA      158               1.04 triphasic         +---------+------------------+-----+---------+--------+ PERO     139               0.91 triphasic         +---------+------------------+-----+---------+--------+ DP       140               0.92 triphasic          +---------+------------------+-----+---------+--------+ Great Toe162               1.07 Normal            +---------+------------------+-----+---------+--------+ +---------+------------------+-----+---------+-------+ Left     Lt Pressure (mmHg)IndexWaveform Comment +---------+------------------+-----+---------+-------+ Brachial 152                                     +---------+------------------+-----+---------+-------+ PTA      141               0.93 triphasic        +---------+------------------+-----+---------+-------+ PERO     127               0.85 biphasic         +---------+------------------+-----+---------+-------+ DP       134               0.88 biphasic         +---------+------------------+-----+---------+-------+ Doristine Devoid Toe154               1.01 Abnormal         +---------+------------------+-----+---------+-------+ +-------+-----------+-----------+------------+------------+ ABI/TBIToday's ABIToday's TBIPrevious ABIPrevious TBI +-------+-----------+-----------+------------+------------+ Right  1.04       1.07       1.07        0.60         +-------+-----------+-----------+------------+------------+ Left   0.93       1.01       1.09        0.48         +-------+-----------+-----------+------------+------------+  Right ABIs appear essentially unchanged compared to prior study on 10/2021. Left ABIs appear decreased compared to prior study on 10/2021.  Summary: Right: Resting right ankle-brachial index is within normal range. No evidence of significant right lower extremity arterial disease. The right toe-brachial index is normal. Left: Resting left ankle-brachial index indicates mild left lower extremity arterial disease. The left toe-brachial index is normal. *See table(s) above for measurements and observations.  Suggest follow up study in 6 months. Electronically signed by Jenkins Rouge MD on 10/30/2021 at 7:52:15 AM.    Final       Impression /  Plan:   Sonya Harmon is a 51 y.o. female with history of heavy tobacco use, coronary artery disease s/p PCI to RCA in 09/2021, left iliac artery angioplasty in 10/2021, alcohol  abuse presented with syncope, coffee-ground emesis in setting of DAPT  Coffee-ground emesis, acute blood loss anemia: Elevated BUN/creatinine Acute upper GI bleed in setting of DAPT, NSAID use and alcohol abuse No evidence of chronic liver disease Maintain 2 large-bore IVs Continue pantoprazole drip Monitor CBC closely to maintain hemoglobin greater than 8 Currently aspirin and Plavix are being held.  Patient will need upper endoscopy for further evaluation.  After discussing with cardiology, given her recent stent as well as angioplasty, patient will carry high risk for in-stent thrombosis if Plavix is held longer.  Therefore, Plavix will be held for 48 hours, then restarted and aspirin will be continued to be held.  We will plan on doing upper endoscopy on Saturday, however if there is a bleeding ulcer or an ulcer with high risk stigmata of bleeding, therapeutic interventions will be limited to injection of epinephrine, placement of clip or Hemospray.  Electrocautery will not be performed due to high risk for rebleeding given that Plavix will be held only for 48 hours. If patient develops active GI bleed, recommend CT angio recommend diagnostic EGD to localize a source of bleeding Patient was asking me if she could go home and undergo EGD as outpatient because she has to take care of her granddaughter on Saturday.  I have informed her that she is at high risk for rebleeding and possibly a cardiac event without localizing the source of bleeding and having to leave early Strict avoidance of NSAID use, complete abstinence from alcohol use  Thank you for involving me in the care of this patient.  GI will follow along with you    LOS: 0 days   Sherri Sear, MD  10/30/2021, 11:48 AM    Note: This dictation was prepared with  Dragon dictation along with smaller phrase technology. Any transcriptional errors that result from this process are unintentional.

## 2021-10-30 NOTE — Assessment & Plan Note (Signed)
-   Holding home aspirin and Plavix at this time - Cardiology has been consulted and recommends holding DAPT for 1 to 2 days and possible resumption of Plavix only - We appreciate further recommendations - Holding home metoprolol as patient is low normotensive on presentation - Atorvastatin 80 mg nightly resumed

## 2021-10-30 NOTE — Assessment & Plan Note (Signed)
-   CIWA precautions - Alcohol cessation counseling has been given to patient

## 2021-10-30 NOTE — Progress Notes (Signed)
Cardiology Office Note   Date:  10/30/2021   ID:  Sonya Harmon, DOB 1971-02-28, MRN [id number]  PCP:  Leonides Sake, MD  Cardiologist:   Kathlyn Sacramento, MD   Chief Complaint  Patient presents with   Other    F/u procedure/ABI c/o syncopal this am pt hit her face/elbow, nausea/vomit was dark chocolate fluid/ sob and rib cage discomfort. Meds reviewed verbally with pt.      History of Present Illness: Sonya Harmon is a 51 y.o. female who presents for a follow-up visit regarding coronary artery disease and peripheral arterial disease. She has history of tongue cancer, tobacco use, alcohol abuse and hyperlipidemia. She was hospitalized in June with unstable angina.  She underwent cardiac catheterization by Dr. Humphrey Rolls from the right femoral artery which showed severe mid RCA stenosis.  PCI was planned but patient had a large groin hematoma.  She was noted to have significant bilateral iliac disease.  She underwent staged RCA PCI via the right radial artery.  She was seen by Dr. Saunders Revel on July 7 after she had a syncopal episode.  She was sent to the emergency room for evaluation.  Troponin was negative x2.  D-dimer was normal.  She was given a liter of fluid and was discharged home.  Labs showed stable anemia with a hemoglobin of 10.7.  The patient had significant bilateral leg claudication due to iliac disease. I proceeded with angiography on July 12 which showed severely calcified aorta with no obstructive disease.  There was severe ostial left renal artery stenosis, no significant iliac disease on the right side and severe left external iliac artery stenosis extending into the distal left common iliac artery.  I performed successful drug-eluting stent placement to the left external iliac artery and drug-coated balloon angioplasty to the distal left common iliac artery.  A stent was not placed there to avoid jailing the internal iliac artery which was severely diseased at the ostium.   Postprocedure duplex showed normal iliac velocities. Her leg claudication resolved.  She is here for follow-up visit.  She has not been feeling well over the last 3 days with increased dizziness, poor oral intake, abdominal pain and recent vomiting.  She recently vomited dark fluid.  She had a syncopal episode this morning in the bathroom and hit her head.  She comes to the office and looks pale and not feeling well at all.  Blood pressure lying down was 86/61 with standing up it was 72/48.  The patient had a large dark bloody vomit in the office with presyncopal episode.    Past Medical History:  Diagnosis Date   Alcohol abuse    Coronary artery disease 09/2021   s/p PCI to RCA in setting of unstable angina   History of tongue cancer    PAD (peripheral artery disease) (HCC)    Tobacco abuse     Past Surgical History:  Procedure Laterality Date   ABDOMINAL AORTOGRAM W/LOWER EXTREMITY N/A 10/15/2021   Procedure: ABDOMINAL AORTOGRAM W/LOWER EXTREMITY;  Surgeon: Wellington Hampshire, MD;  Location: [place] [facility];  Service: Cardiovascular;  Laterality: N/A;   BILATERAL SALPINGECTOMY     CORONARY BALLOON ANGIOPLASTY N/A 09/08/2021   Procedure: CORONARY BALLOON ANGIOPLASTY - ABORTED;  Surgeon: Wellington Hampshire, MD;  Location: [place] [facility];  Service: Cardiovascular;  Laterality: N/A;   CORONARY STENT INTERVENTION N/A 09/10/2021   Procedure: CORONARY STENT INTERVENTION;  Surgeon: Wellington Hampshire, MD;  Location: [place]  [facility];  Service: Cardiovascular;  Laterality: N/A;   LEFT HEART CATH AND CORONARY ANGIOGRAPHY N/A 09/08/2021   Procedure: LEFT HEART CATH AND CORONARY ANGIOGRAPHY;  Surgeon: Dionisio David, MD;  Location: [place] [facility];  Service: Cardiovascular;  Laterality: N/A;   PERIPHERAL VASCULAR INTERVENTION  10/15/2021   Procedure: PERIPHERAL VASCULAR INTERVENTION;  Surgeon: Wellington Hampshire, MD;  Location: [place] [facility];  Service: Cardiovascular;;   TONGUE  SURGERY       Current Outpatient Medications  Medication Sig Dispense Refill   aspirin 81 MG chewable tablet Chew 1 tablet (81 mg total) by mouth daily.     atorvastatin (LIPITOR) 80 MG tablet Take 1 tablet (80 mg total) by mouth daily. 30 tablet 2   clopidogrel (PLAVIX) 75 MG tablet Take 1 tablet (75 mg total) by mouth daily. 30 tablet 11   metoprolol tartrate (LOPRESSOR) 25 MG tablet Take 0.5 tablets (12.5 mg total) by mouth 2 (two) times daily. 30 tablet 2   No current facility-administered medications for this visit.    Allergies:   Amoxicil &clarithro &lansopraz and Amoxicillin    Social History:  The patient  reports that she quit smoking about 7 weeks ago. Her smoking use included cigarettes. She smoked an average of .5 packs per day. She has quit using smokeless tobacco. She reports that she does not currently use alcohol after a past usage of about 42.0 standard drinks of alcohol per week. She reports that she does not currently use drugs after having used the following drugs: Marijuana.   Family History:  The patient's family history includes Aneurysm in her sister; Cancer in her father and mother; Stroke in her sister.    ROS:  Please see the history of present illness.   Otherwise, review of systems are positive for none.   All other systems are reviewed and negative.    PHYSICAL EXAM: VS:  BP 110/64 (BP Location: Left Arm, Patient Position: Sitting, Cuff Size: Normal)   Ht '5\' 2"'$  (1.575 m)   Wt 101 lb 6 oz (46 kg)   SpO2 96%   BMI 18.54 kg/m  , BMI Body mass index is 18.54 kg/m. GEN: She appears pale and in mild distress.  Slightly lethargic. HEENT: normal  Neck: no JVD, carotid bruits, or masses Cardiac: RRR; no murmurs, rubs, or gallops,no edema  Respiratory:  clear to auscultation bilaterally, normal work of breathing GI: soft, nontender, nondistended, + BS MS: no deformity or atrophy  Skin: warm and dry, no rash Neuro:  Strength and sensation are  intact Psych: Lethargic and pale.   EKG:  EKG is ordered today. The ekg ordered today demonstrates normal sinus rhythm with nonspecific ST changes.  No evidence of ST elevation.   Recent Labs: 09/07/2021: B Natriuretic Peptide 30.4 09/08/2021: TSH 4.241 09/11/2021: Magnesium 2.0 09/12/2021: ALT 25 10/10/2021: BUN 10; Creatinine, Ser 0.60; Hemoglobin 10.7; Platelets 414; Potassium 4.7; Sodium 133    Lipid Panel    Component Value Date/Time   CHOL 177 09/09/2021 0541   TRIG 30 09/09/2021 0541   HDL 95 09/09/2021 0541   CHOLHDL 1.9 09/09/2021 0541   VLDL 6 09/09/2021 0541   LDLCALC 76 09/09/2021 0541      Wt Readings from Last 3 Encounters:  10/30/21 101 lb 6 oz (46 kg)  10/15/21 104 lb (47.2 kg)  10/10/21 103 lb (46.7 kg)           No data to display  ASSESSMENT AND PLAN:  1.  Syncope: She is hypotensive and had a large dark bloody vomit.  Suspect GI bleed.  The patient became lethargic and thus rapid response was called to transfer to the ED.  An IV was started and she was started on IV fluids.  The patient is on dual antiplatelet therapy given recent cardiac stent and left iliac stent.  2.  Peripheral arterial disease: Status post recent left iliac stent.  Duplex showed normal velocities.  She reports resolution of claudication.  3.  Coronary artery disease involving native coronary arteries without angina: She had RCA stent done.  EKG with no ischemic changes and she denies angina at the present time.  4.  Tobacco use: She reports that she quit smoking recently.  She denies recent alcohol use.  5.  Hyperlipidemia: Currently on atorvastatin 80 mg daily with a target LDL of less than 70.    Disposition: The patient was transferred to the ED by rapid response team due to suspected GI bleed with hypotension and syncope.  The patient likely will require CT head as well given reported fall in her head this morning after syncopal episode.  Signed,  Kathlyn Sacramento, MD  10/30/2021 9:25 AM    [place]

## 2021-10-31 ENCOUNTER — Observation Stay (HOSPITAL_COMMUNITY)
Admit: 2021-10-31 | Discharge: 2021-10-31 | Disposition: A | Discharge status: Home / Self Care | Payer: Commercial Managed Care - HMO | Attending: Physician Assistant | Admitting: Physician Assistant

## 2021-10-31 ENCOUNTER — Observation Stay: Admit: 2021-10-31 | Payer: Commercial Managed Care - HMO

## 2021-10-31 ENCOUNTER — Encounter: Payer: Self-pay | Admitting: Internal Medicine

## 2021-10-31 DIAGNOSIS — S0083XA Contusion of other part of head, initial encounter: Secondary | ICD-10-CM | POA: Diagnosis present

## 2021-10-31 DIAGNOSIS — E871 Hypo-osmolality and hyponatremia: Secondary | ICD-10-CM | POA: Diagnosis present

## 2021-10-31 DIAGNOSIS — D62 Acute posthemorrhagic anemia: Secondary | ICD-10-CM | POA: Diagnosis present

## 2021-10-31 DIAGNOSIS — Z881 Allergy status to other antibiotic agents status: Secondary | ICD-10-CM | POA: Diagnosis not present

## 2021-10-31 DIAGNOSIS — G8929 Other chronic pain: Secondary | ICD-10-CM | POA: Diagnosis present

## 2021-10-31 DIAGNOSIS — E876 Hypokalemia: Secondary | ICD-10-CM | POA: Diagnosis not present

## 2021-10-31 DIAGNOSIS — E538 Deficiency of other specified B group vitamins: Secondary | ICD-10-CM | POA: Diagnosis present

## 2021-10-31 DIAGNOSIS — K920 Hematemesis: Secondary | ICD-10-CM | POA: Diagnosis present

## 2021-10-31 DIAGNOSIS — E872 Acidosis, unspecified: Secondary | ICD-10-CM | POA: Diagnosis present

## 2021-10-31 DIAGNOSIS — I959 Hypotension, unspecified: Secondary | ICD-10-CM | POA: Diagnosis present

## 2021-10-31 DIAGNOSIS — E861 Hypovolemia: Secondary | ICD-10-CM | POA: Diagnosis present

## 2021-10-31 DIAGNOSIS — Z79899 Other long term (current) drug therapy: Secondary | ICD-10-CM | POA: Diagnosis not present

## 2021-10-31 DIAGNOSIS — I739 Peripheral vascular disease, unspecified: Secondary | ICD-10-CM | POA: Diagnosis not present

## 2021-10-31 DIAGNOSIS — K279 Peptic ulcer, site unspecified, unspecified as acute or chronic, without hemorrhage or perforation: Secondary | ICD-10-CM | POA: Diagnosis not present

## 2021-10-31 DIAGNOSIS — K2980 Duodenitis without bleeding: Secondary | ICD-10-CM | POA: Diagnosis not present

## 2021-10-31 DIAGNOSIS — W19XXXA Unspecified fall, initial encounter: Secondary | ICD-10-CM | POA: Diagnosis not present

## 2021-10-31 DIAGNOSIS — R55 Syncope and collapse: Secondary | ICD-10-CM | POA: Diagnosis present

## 2021-10-31 DIAGNOSIS — Z7902 Long term (current) use of antithrombotics/antiplatelets: Secondary | ICD-10-CM | POA: Diagnosis not present

## 2021-10-31 DIAGNOSIS — I2510 Atherosclerotic heart disease of native coronary artery without angina pectoris: Secondary | ICD-10-CM | POA: Diagnosis present

## 2021-10-31 DIAGNOSIS — F1010 Alcohol abuse, uncomplicated: Secondary | ICD-10-CM | POA: Diagnosis not present

## 2021-10-31 DIAGNOSIS — K922 Gastrointestinal hemorrhage, unspecified: Secondary | ICD-10-CM | POA: Diagnosis not present

## 2021-10-31 DIAGNOSIS — Z7982 Long term (current) use of aspirin: Secondary | ICD-10-CM | POA: Diagnosis not present

## 2021-10-31 DIAGNOSIS — T39395A Adverse effect of other nonsteroidal anti-inflammatory drugs [NSAID], initial encounter: Secondary | ICD-10-CM | POA: Diagnosis present

## 2021-10-31 DIAGNOSIS — E785 Hyperlipidemia, unspecified: Secondary | ICD-10-CM | POA: Diagnosis present

## 2021-10-31 DIAGNOSIS — Z955 Presence of coronary angioplasty implant and graft: Secondary | ICD-10-CM | POA: Diagnosis not present

## 2021-10-31 DIAGNOSIS — Z85810 Personal history of malignant neoplasm of tongue: Secondary | ICD-10-CM | POA: Diagnosis not present

## 2021-10-31 DIAGNOSIS — K760 Fatty (change of) liver, not elsewhere classified: Secondary | ICD-10-CM | POA: Diagnosis present

## 2021-10-31 DIAGNOSIS — K921 Melena: Secondary | ICD-10-CM | POA: Diagnosis present

## 2021-10-31 DIAGNOSIS — K259 Gastric ulcer, unspecified as acute or chronic, without hemorrhage or perforation: Secondary | ICD-10-CM | POA: Diagnosis not present

## 2021-10-31 DIAGNOSIS — W1839XA Other fall on same level, initial encounter: Secondary | ICD-10-CM | POA: Diagnosis present

## 2021-10-31 DIAGNOSIS — F17200 Nicotine dependence, unspecified, uncomplicated: Secondary | ICD-10-CM | POA: Diagnosis present

## 2021-10-31 DIAGNOSIS — K254 Chronic or unspecified gastric ulcer with hemorrhage: Secondary | ICD-10-CM | POA: Diagnosis present

## 2021-10-31 DIAGNOSIS — K449 Diaphragmatic hernia without obstruction or gangrene: Secondary | ICD-10-CM | POA: Diagnosis present

## 2021-10-31 LAB — BASIC METABOLIC PANEL
Anion gap: 6 (ref 5–15)
BUN: 17 mg/dL (ref 6–20)
CO2: 22 mmol/L (ref 22–32)
Calcium: 8.1 mg/dL — ABNORMAL LOW (ref 8.9–10.3)
Chloride: 111 mmol/L (ref 98–111)
Creatinine, Ser: 0.72 mg/dL (ref 0.44–1.00)
GFR, Estimated: >60 mL/min (ref >60)
Glucose, Bld: 88 mg/dL (ref 70–99)
Potassium: 3.3 mmol/L — ABNORMAL LOW (ref 3.5–5.1)
Sodium: 139 mmol/L (ref 135–145)

## 2021-10-31 LAB — ECHOCARDIOGRAM COMPLETE
AR max vel: 2.39 cm2
AV Area VTI: 2.66 cm2
AV Area mean vel: 2.16 cm2
AV Mean grad: 5 mmHg
AV Peak grad: 8.5 mmHg
Ao pk vel: 1.46 m/s
Area-P 1/2: 5.34 cm2
Height: 62 in
S' Lateral: 2.3 cm
Weight: 1676.8 oz

## 2021-10-31 LAB — CBC
HCT: 20.4 % — ABNORMAL LOW (ref 36.0–46.0)
Hemoglobin: 6.8 g/dL — ABNORMAL LOW (ref 12.0–15.0)
MCH: 31.5 pg (ref 26.0–34.0)
MCHC: 33.3 g/dL (ref 30.0–36.0)
MCV: 94.4 fL (ref 80.0–100.0)
Platelets: 251 10*3/uL (ref 150–400)
RBC: 2.16 MIL/uL — ABNORMAL LOW (ref 3.87–5.11)
RDW: 13.8 % (ref 11.5–15.5)
WBC: 5.8 10*3/uL (ref 4.0–10.5)
nRBC: 0 % (ref 0.0–0.2)

## 2021-10-31 LAB — VITAMIN B12: Vitamin B-12: 170 pg/mL — ABNORMAL LOW (ref 180–914)

## 2021-10-31 LAB — HEMOGLOBIN AND HEMATOCRIT, BLOOD
HCT: 26.1 % — ABNORMAL LOW (ref 36.0–46.0)
Hemoglobin: 8.9 g/dL — ABNORMAL LOW (ref 12.0–15.0)

## 2021-10-31 LAB — PREPARE RBC (CROSSMATCH)

## 2021-10-31 LAB — ABO/RH: ABO/RH(D): O NEG

## 2021-10-31 MED ORDER — CYANOCOBALAMIN 1000 MCG/ML IJ SOLN
1000.0000 ug | Freq: Once | INTRAMUSCULAR | Status: AC
  Administered 2021-10-31: 1000 ug via SUBCUTANEOUS
  Filled 2021-10-31: qty 1

## 2021-10-31 MED ORDER — SODIUM CHLORIDE 0.9% IV SOLUTION: Freq: Once | INTRAVENOUS | Status: AC

## 2021-10-31 MED ORDER — SODIUM CHLORIDE 0.9 % IV SOLN: INTRAVENOUS | Status: DC

## 2021-10-31 MED ORDER — SODIUM CHLORIDE 0.9 % IV SOLN
300.0000 mg | Freq: Once | INTRAVENOUS | Status: AC
  Administered 2021-10-31: 300 mg via INTRAVENOUS
  Filled 2021-10-31: qty 300

## 2021-10-31 MED ORDER — BOOST / RESOURCE BREEZE PO LIQD CUSTOM
1.0000 | Freq: Three times a day (TID) | ORAL | Status: DC
  Administered 2021-10-31 – 2021-11-02 (×4): 1 via ORAL

## 2021-10-31 MED ORDER — PROSOURCE PLUS PO LIQD
30.0000 mL | Freq: Three times a day (TID) | ORAL | Status: DC
  Administered 2021-10-31 – 2021-11-02 (×4): 30 mL via ORAL
  Filled 2021-10-31 (×6): qty 30

## 2021-10-31 MED ORDER — ENSURE ENLIVE PO LIQD
237.0000 mL | Freq: Two times a day (BID) | ORAL | Status: DC
  Administered 2021-10-31 (×2): 237 mL via ORAL

## 2021-10-31 MED ORDER — POTASSIUM CHLORIDE CRYS ER 20 MEQ PO TBCR
40.0000 meq | EXTENDED_RELEASE_TABLET | Freq: Once | ORAL | Status: AC
  Administered 2021-10-31: 40 meq via ORAL
  Filled 2021-10-31: qty 2

## 2021-10-31 NOTE — Progress Notes (Signed)
*  PRELIMINARY RESULTS* Echocardiogram 2D Echocardiogram has been performed.  Sherrie Sport 10/31/2021, 10:47 AM

## 2021-10-31 NOTE — Progress Notes (Signed)
PROGRESS NOTE    Sonya Harmon  YTK:[id number] DOB: 07-Aug-1970 DOA: 10/30/2021 PCP: Leonides Sake, MD  Assessment & Plan:   Principal Problem:   Upper GI bleed Active Problems:   Tobacco dependence   Alcohol abuse   Abdominal pain   Hepatic steatosis   PAD (peripheral artery disease) (HCC)   Hyponatremia   Traumatic ecchymosis of face   CAD S/P percutaneous coronary angioplasty  Assessment and Plan:  Upper GI bleed: likely secondary to alcohol abuse. Continue on IV PPI. Holding aspirin, plavix. Will transfuse 1 unit of pRBCs today. Repeat H&H 4 hrs post transfusion. EGD tomorrow likely. GI following and recs apprec    Alcohol abuse: received alcohol cessation counseling. Continue on CIWA protocol   Hypokalemia: potassium given    Tobacco dependence: quit smoking in June 2023   Hx of CAD: s/p percutaneous coronary angioplasty. Holding aspirin for 1 to 2 days and possible resumption of Plavix only as per cardio. Hold metoprolol    Traumatic ecchymosis of face: present on admission. Of left jaw and left side of her face, secondary to traumatic syncope at home   Hyponatremia: likely secondary to alcohol abuse. Continue on IVFs   Hepatic steatosis: likely secondary to alcohol abuse      DVT prophylaxis: SCDs Code Status: full  Family Communication: Disposition Plan:  likely d/c back home    Level of care: Progressive  Status is: Inpatient Remains inpatient appropriate because: severity of illness      Consultants:  GI Cardio   Procedures:   Antimicrobials:   Subjective: Pt c/o malaise   Objective: Vitals:   10/31/21 0222 10/31/21 0234 10/31/21 0444 10/31/21 0738  BP: 115/67  106/75 121/75  Pulse: 66  70 71  Resp: '11  19 18  '$ Temp: 98 F (36.7 C)  98.2 F (36.8 C) 98.2 F (36.8 C)  TempSrc: Oral     SpO2: 100%  100% 100%  Weight:  47.5 kg    Height:  '5\' 2"'$  (1.575 m)      Intake/Output Summary (Last 24 hours) at 10/31/2021 0809 Last data  filed at 10/31/2021 0500 Gross per 24 hour  Intake 2752.85 ml  Output 850 ml  Net 1902.85 ml   Filed Weights   10/30/21 0959 10/31/21 0234  Weight: 45 kg 47.5 kg    Examination:  General exam: Appears calm and comfortable  Respiratory system: Clear to auscultation. Respiratory effort normal. Cardiovascular system: S1 & S2 +. No rubs, gallops or clicks.  Gastrointestinal system: Abdomen is nondistended, soft and nontender. Normal bowel sounds heard. Central nervous system: Alert and oriented. Moves all extremities  Psychiatry: Judgement and insight appear normal. Flat mood and affect    Data Reviewed: I have personally reviewed following labs and imaging studies  CBC: Recent Labs  Lab 10/30/21 1001 10/31/21 0515  WBC 11.5* 5.8  HGB 8.6* 6.8*  HCT 25.9* 20.4*  MCV 96.3 94.4  PLT 342 751   Basic Metabolic Panel: Recent Labs  Lab 10/30/21 1001 10/31/21 0515  NA 129* 139  K 3.7 3.3*  CL 97* 111  CO2 19* 22  GLUCOSE 123* 88  BUN 32* 17  CREATININE 0.82 0.72  CALCIUM 8.1* 8.1*  MG 1.9  --    GFR: Estimated Creatinine Clearance: 63.8 mL/min (by C-G formula based on SCr of 0.72 mg/dL). Liver Function Tests: Recent Labs  Lab 10/30/21 1001  AST 34  ALT 18  ALKPHOS 53  BILITOT 0.6  PROT 6.2*  ALBUMIN 3.5   No results for input(s): "LIPASE", "AMYLASE" in the last 168 hours. No results for input(s): "AMMONIA" in the last 168 hours. Coagulation Profile: Recent Labs  Lab 10/30/21 1001  INR 1.1   Cardiac Enzymes: No results for input(s): "CKTOTAL", "CKMB", "CKMBINDEX", "TROPONINI" in the last 168 hours. BNP (last 3 results) No results for input(s): "PROBNP" in the last 8760 hours. HbA1C: No results for input(s): "HGBA1C" in the last 72 hours. CBG: No results for input(s): "GLUCAP" in the last 168 hours. Lipid Profile: No results for input(s): "CHOL", "HDL", "LDLCALC", "TRIG", "CHOLHDL", "LDLDIRECT" in the last 72 hours. Thyroid Function Tests: No  results for input(s): "TSH", "T4TOTAL", "FREET4", "T3FREE", "THYROIDAB" in the last 72 hours. Anemia Panel: Recent Labs    10/30/21 1159  FOLATE 7.2  FERRITIN 20  TIBC 351  IRON 56   Sepsis Labs: Recent Labs  Lab 10/30/21 1006 10/30/21 1159  LATICACIDVEN 4.2* 3.6*    Recent Results (from the past 240 hour(s))  Blood culture (routine x 2)     Status: None (Preliminary result)   Collection Time: 10/30/21 11:58 AM   Specimen: BLOOD  Result Value Ref Range Status   Specimen Description BLOOD LEFT ANTECUBITAL  Final   Special Requests   Final    BOTTLES DRAWN AEROBIC AND ANAEROBIC Blood Culture adequate volume   Culture   Final    NO GROWTH < 24 HOURS Performed at [facility], [address] [place], [place] [address]    Report Status PENDING  Incomplete  Blood culture (routine x 2)     Status: None (Preliminary result)   Collection Time: 10/30/21 12:00 PM   Specimen: BLOOD  Result Value Ref Range Status   Specimen Description BLOOD BLOOD LEFT HAND  Final   Special Requests   Final    BOTTLES DRAWN AEROBIC ONLY Blood Culture adequate volume   Culture   Final    NO GROWTH < 24 HOURS Performed at [facility], [address], [place], [place] [address]    Report Status PENDING  Incomplete         Radiology Studies: CT T-SPINE NO CHARGE  Result Date: 10/30/2021 CLINICAL DATA:  Syncope and fall EXAM: CT Thoracic and Lumbar spine with contrast TECHNIQUE: Multiplanar CT images of the thoracic and lumbar spine were reconstructed from contemporary CT of the Chest, Abdomen, and Pelvis. RADIATION DOSE REDUCTION: This exam was performed according to the departmental dose-optimization program which includes automated exposure control, adjustment of the mA and/or kV according to patient size and/or use of iterative reconstruction technique. CONTRAST:  100 cc Omnipaque 350 COMPARISON:  None Available. CTA chest/abdomen/pelvis 09/08/2018 FINDINGS: CT THORACIC  SPINE FINDINGS Alignment: There is mild dextrocurvature centered in the midthoracic spine. There is no antero or retrolisthesis or other evidence of traumatic malalignment. Vertebrae: Vertebral body heights are preserved. There is no evidence of acute fracture. There is no suspicious osseous lesion. Paraspinal and other soft tissues: The paraspinal soft tissues are unremarkable. The heart and lungs are assessed on the separately dictated CTA chest. Disc levels: There is no significant osseous spinal canal or neural foraminal stenosis. CT LUMBAR SPINE FINDINGS Segmentation: Standard. Alignment: Normal. Vertebrae: Vertebral body heights are preserved. There is no evidence of acute fracture. There is no suspicious osseous lesion. Paraspinal and other soft tissues: The paraspinal soft tissues are unremarkable. The abdominal and pelvic viscera are assessed on the separately dictated CT abdomen/pelvis. Disc levels: There is no significant spinal canal or  neural foraminal stenosis. IMPRESSION: No acute fracture or traumatic malalignment of the thoracolumbar spine. Electronically Signed   By: Valetta Mole M.D.   On: 10/30/2021 11:51   CT L-SPINE NO CHARGE  Result Date: 10/30/2021 CLINICAL DATA:  Syncope and fall EXAM: CT Thoracic and Lumbar spine with contrast TECHNIQUE: Multiplanar CT images of the thoracic and lumbar spine were reconstructed from contemporary CT of the Chest, Abdomen, and Pelvis. RADIATION DOSE REDUCTION: This exam was performed according to the departmental dose-optimization program which includes automated exposure control, adjustment of the mA and/or kV according to patient size and/or use of iterative reconstruction technique. CONTRAST:  100 cc Omnipaque 350 COMPARISON:  None Available. CTA chest/abdomen/pelvis 09/08/2018 FINDINGS: CT THORACIC SPINE FINDINGS Alignment: There is mild dextrocurvature centered in the midthoracic spine. There is no antero or retrolisthesis or other evidence of  traumatic malalignment. Vertebrae: Vertebral body heights are preserved. There is no evidence of acute fracture. There is no suspicious osseous lesion. Paraspinal and other soft tissues: The paraspinal soft tissues are unremarkable. The heart and lungs are assessed on the separately dictated CTA chest. Disc levels: There is no significant osseous spinal canal or neural foraminal stenosis. CT LUMBAR SPINE FINDINGS Segmentation: Standard. Alignment: Normal. Vertebrae: Vertebral body heights are preserved. There is no evidence of acute fracture. There is no suspicious osseous lesion. Paraspinal and other soft tissues: The paraspinal soft tissues are unremarkable. The abdominal and pelvic viscera are assessed on the separately dictated CT abdomen/pelvis. Disc levels: There is no significant spinal canal or neural foraminal stenosis. IMPRESSION: No acute fracture or traumatic malalignment of the thoracolumbar spine. Electronically Signed   By: Valetta Mole M.D.   On: 10/30/2021 11:51   CT Angio Chest/Abd/Pel for Dissection W and/or Wo Contrast  Result Date: 10/30/2021 CLINICAL DATA:  Acute aortic syndrome suspected. Bloody emesis. Syncopal episode. EXAM: CT ANGIOGRAPHY CHEST, ABDOMEN AND PELVIS TECHNIQUE: Non-contrast CT of the chest was initially obtained. Multidetector CT imaging through the chest, abdomen and pelvis was performed using the standard protocol during bolus administration of intravenous contrast. Multiplanar reconstructed images and MIPs were obtained and reviewed to evaluate the vascular anatomy. RADIATION DOSE REDUCTION: This exam was performed according to the departmental dose-optimization program which includes automated exposure control, adjustment of the mA and/or kV according to patient size and/or use of iterative reconstruction technique. CONTRAST:  165m OMNIPAQUE IOHEXOL 350 MG/ML SOLN COMPARISON:  CTA 09/07/2021 FINDINGS: CTA CHEST FINDINGS Cardiovascular: No aortic dissection or  aneurysm. Ascending aorta is nonaneurysmal measuring 31 mm. Intimal calcification is at the origin of the LEFT subclavian artery. Otherwise great vessels are normal. Descending thoracic aorta demonstrates mild intimal calcification without aneurysm or dissection. No evidence acute pulmonary embolism. Mediastinum/Nodes: Normal esophagus and trachea.  No adenopathy. Lungs/Pleura: No pulmonary infarction. No pneumothorax. No airspace disease. Musculoskeletal: No acute osseous abnormality. Review of the MIP images confirms the above findings. CTA ABDOMEN AND PELVIS FINDINGS VASCULAR Aorta: Normal caliber aorta without aneurysm, dissection, vasculitis or significant stenosis. Celiac: Patent without evidence of aneurysm, dissection, vasculitis or significant stenosis. SMA: Patent without evidence of aneurysm, dissection, vasculitis or significant stenosis. Again demonstrated smooth narrowing along the dorsal margin of the vessel with reconstitution. No change from prior. Renals: Both renal arteries are patent without evidence of aneurysm, dissection, vasculitis, fibromuscular dysplasia or significant stenosis. IMA: Patent without evidence of aneurysm, dissection, vasculitis or significant stenosis. Inflow: Calcific plaque with stenosis of the LEFT common femoral artery. Reconstitution. No change from prior. Veins: No obvious venous abnormality  within the limitations of this arterial phase study. Review of the MIP images confirms the above findings. NON-VASCULAR Hepatobiliary: No focal hepatic lesion. No biliary duct dilatation. Common bile duct is normal. Pancreas: Pancreas is normal. No ductal dilatation. No pancreatic inflammation. Spleen: Normal spleen Adrenals/urinary tract: Adrenal glands and kidneys are normal. The ureters and bladder normal. Stomach/Bowel: Stomach, small bowel, appendix, and cecum are normal. The colon and rectosigmoid colon are normal. Vascular/Lymphatic: Abdominal aorta is normal caliber. No  periportal or retroperitoneal adenopathy. No pelvic adenopathy. Reproductive: Uterus and adnexa unremarkable. Other: No free fluid. Musculoskeletal: No aggressive osseous lesion. Review of the MIP images confirms the above findings. IMPRESSION: Chest Impression: 1. No evidence of aortic dissection or aneurysm. 2. 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 3. No evidence acute pulmonary embolism. 4. No acute pulmonary parenchymal findings. Abdomen / Pelvis Impression: 1. No acute findings the abdominal aorta or branches. Atherosclerotic calcification again noted. No change in proximal stenosis of the SMA and LEFT common iliac artery compared to CT a 09/07/2021. 2. No acute findings in the abdomen pelvis. Electronically Signed   By: Suzy Bouchard M.D.   On: 10/30/2021 11:39   CT HEAD WO CONTRAST (5MM)  Result Date: 10/30/2021 CLINICAL DATA:  Head trauma, moderate-severe; Facial trauma, blunt; Neck trauma, dangerous injury mechanism (Age 66-64y) EXAM: CT HEAD WITHOUT CONTRAST CT MAXILLOFACIAL WITHOUT CONTRAST CT CERVICAL SPINE WITHOUT CONTRAST TECHNIQUE: Multidetector CT imaging of the head, cervical spine, and maxillofacial structures were performed using the standard protocol without intravenous contrast. Multiplanar CT image reconstructions of the cervical spine and maxillofacial structures were also generated. RADIATION DOSE REDUCTION: This exam was performed according to the departmental dose-optimization program which includes automated exposure control, adjustment of the mA and/or kV according to patient size and/or use of iterative reconstruction technique. COMPARISON:  None Available. FINDINGS: CT HEAD FINDINGS Brain: No evidence of acute infarction, hemorrhage, hydrocephalus, extra-axial collection or mass lesion/mass effect. Vascular: No hyperdense vessel. Skull: No acute fracture. Other: No mastoid effusions. CT MAXILLOFACIAL FINDINGS Osseous: No fracture or  mandibular dislocation. No destructive process. Orbits: Negative. No traumatic or inflammatory finding. Sinuses: Minimal inferior maxillary sinus mucosal thickening. Otherwise, clear. Soft tissues: Negative. CT CERVICAL SPINE FINDINGS Alignment: Reversal of normal cervical lordosis. Skull base and vertebrae: Vertebral body heights are maintained. No acute fracture Soft tissues and spinal canal: No prevertebral fluid or swelling. No visible canal hematoma. Disc levels:  No significant focal bony degenerative change. Upper chest: Visualized lung apices are clear. IMPRESSION: 1. No evidence of acute intracranial abnormality or facial fracture. 2. No evidence of acute fracture or traumatic malalignment the cervical spine. Electronically Signed   By: Margaretha Sheffield M.D.   On: 10/30/2021 11:28   CT Cervical Spine Wo Contrast  Result Date: 10/30/2021 CLINICAL DATA:  Head trauma, moderate-severe; Facial trauma, blunt; Neck trauma, dangerous injury mechanism (Age 22-64y) EXAM: CT HEAD WITHOUT CONTRAST CT MAXILLOFACIAL WITHOUT CONTRAST CT CERVICAL SPINE WITHOUT CONTRAST TECHNIQUE: Multidetector CT imaging of the head, cervical spine, and maxillofacial structures were performed using the standard protocol without intravenous contrast. Multiplanar CT image reconstructions of the cervical spine and maxillofacial structures were also generated. RADIATION DOSE REDUCTION: This exam was performed according to the departmental dose-optimization program which includes automated exposure control, adjustment of the mA and/or kV according to patient size and/or use of iterative reconstruction technique. COMPARISON:  None Available. FINDINGS: CT HEAD FINDINGS Brain: No evidence of acute infarction, hemorrhage, hydrocephalus, extra-axial collection or mass  lesion/mass effect. Vascular: No hyperdense vessel. Skull: No acute fracture. Other: No mastoid effusions. CT MAXILLOFACIAL FINDINGS Osseous: No fracture or mandibular  dislocation. No destructive process. Orbits: Negative. No traumatic or inflammatory finding. Sinuses: Minimal inferior maxillary sinus mucosal thickening. Otherwise, clear. Soft tissues: Negative. CT CERVICAL SPINE FINDINGS Alignment: Reversal of normal cervical lordosis. Skull base and vertebrae: Vertebral body heights are maintained. No acute fracture Soft tissues and spinal canal: No prevertebral fluid or swelling. No visible canal hematoma. Disc levels:  No significant focal bony degenerative change. Upper chest: Visualized lung apices are clear. IMPRESSION: 1. No evidence of acute intracranial abnormality or facial fracture. 2. No evidence of acute fracture or traumatic malalignment the cervical spine. Electronically Signed   By: Margaretha Sheffield M.D.   On: 10/30/2021 11:28   CT Maxillofacial Wo Contrast  Result Date: 10/30/2021 CLINICAL DATA:  Head trauma, moderate-severe; Facial trauma, blunt; Neck trauma, dangerous injury mechanism (Age 35-64y) EXAM: CT HEAD WITHOUT CONTRAST CT MAXILLOFACIAL WITHOUT CONTRAST CT CERVICAL SPINE WITHOUT CONTRAST TECHNIQUE: Multidetector CT imaging of the head, cervical spine, and maxillofacial structures were performed using the standard protocol without intravenous contrast. Multiplanar CT image reconstructions of the cervical spine and maxillofacial structures were also generated. RADIATION DOSE REDUCTION: This exam was performed according to the departmental dose-optimization program which includes automated exposure control, adjustment of the mA and/or kV according to patient size and/or use of iterative reconstruction technique. COMPARISON:  None Available. FINDINGS: CT HEAD FINDINGS Brain: No evidence of acute infarction, hemorrhage, hydrocephalus, extra-axial collection or mass lesion/mass effect. Vascular: No hyperdense vessel. Skull: No acute fracture. Other: No mastoid effusions. CT MAXILLOFACIAL FINDINGS Osseous: No fracture or mandibular dislocation. No  destructive process. Orbits: Negative. No traumatic or inflammatory finding. Sinuses: Minimal inferior maxillary sinus mucosal thickening. Otherwise, clear. Soft tissues: Negative. CT CERVICAL SPINE FINDINGS Alignment: Reversal of normal cervical lordosis. Skull base and vertebrae: Vertebral body heights are maintained. No acute fracture Soft tissues and spinal canal: No prevertebral fluid or swelling. No visible canal hematoma. Disc levels:  No significant focal bony degenerative change. Upper chest: Visualized lung apices are clear. IMPRESSION: 1. No evidence of acute intracranial abnormality or facial fracture. 2. No evidence of acute fracture or traumatic malalignment the cervical spine. Electronically Signed   By: Margaretha Sheffield M.D.   On: 10/30/2021 11:28   DG Elbow Complete Left  Result Date: 10/30/2021 CLINICAL DATA:  Syncopal episode.  Fell.  Left elbow pain. EXAM: LEFT ELBOW - COMPLETE 3+ VIEW COMPARISON:  None Available. FINDINGS: The joint spaces are maintained. No acute elbow fracture. No osteochondral lesion. No joint effusion. IMPRESSION: No acute bony findings. Electronically Signed   By: Marijo Sanes M.D.   On: 10/30/2021 10:37        Scheduled Meds:  sodium chloride   Intravenous Once   atorvastatin  80 mg Oral QHS   folic acid  1 mg Oral Daily   multivitamin with minerals  1 tablet Oral Daily   [START ON 11/02/2021] pantoprazole  40 mg Intravenous Q12H   potassium chloride  40 mEq Oral Once   thiamine  100 mg Oral Daily   Or   thiamine  100 mg Intravenous Daily   Continuous Infusions:  sodium chloride 125 mL/hr at 10/31/21 0049   pantoprazole 8 mg/hr (10/30/21 2051)     LOS: 0 days    Time spent: 35 mins     Wyvonnia Dusky, MD [facility] Pager [phone]  If 7PM-7AM, please contact night-coverage  [email or web address] 10/31/2021, 8:09 AM

## 2021-10-31 NOTE — Progress Notes (Addendum)
[name], MD [address]  [place]  [place], [place] [address]  Main: [phone]  Fax: [phone] Pager: [phone]   Subjective: Patient is ongoing episodes of melena.  Has been hemodynamically stable.  Reports that she has been tolerating liquids well.  She is receiving blood transfusion.  Also, received IV iron   Objective: Vital signs in last 24 hours: Vitals:   10/31/21 0738 10/31/21 1131 10/31/21 1147 10/31/21 1600  BP: 121/75 122/68 121/68 129/78  Pulse: 71 79 66 70  Resp: '18 16  17  '$ Temp: 98.2 F (36.8 C) 98.9 F (37.2 C) 98.8 F (37.1 C) 98.6 F (37 C)  TempSrc:  Oral  Oral  SpO2: 100% 100% 100%   Weight:      Height:       Weight change:   Intake/Output Summary (Last 24 hours) at 10/31/2021 1629 Last data filed at 10/31/2021 1600 Gross per 24 hour  Intake 2462.53 ml  Output 300 ml  Net 2162.53 ml     Exam: Heart:: Regular rate and rhythm, S1S2 present, or without murmur or extra heart sounds Lungs: normal and clear to auscultation Abdomen: soft, nontender, normal bowel sounds   Lab Results:    Latest Ref Rng & Units 10/31/2021    5:15 AM 10/30/2021   10:01 AM 10/10/2021    4:20 PM  CBC  WBC 4.0 - 10.5 K/uL 5.8  11.5  7.9   Hemoglobin 12.0 - 15.0 g/dL 6.8  8.6  10.7   Hematocrit 36.0 - 46.0 % 20.4  25.9  32.3   Platelets 150 - 400 K/uL 251  342  414       Latest Ref Rng & Units 10/31/2021    5:15 AM 10/30/2021   10:01 AM 10/10/2021    4:20 PM  CMP  Glucose 70 - 99 mg/dL 88  123  74   BUN 6 - 20 mg/dL 17  32  10   Creatinine 0.44 - 1.00 mg/dL 0.72  0.82  0.60   Sodium 135 - 145 mmol/L 139  129  133   Potassium 3.5 - 5.1 mmol/L 3.3  3.7  4.7   Chloride 98 - 111 mmol/L 111  97  101   CO2 22 - 32 mmol/L '22  19  19   '$ Calcium 8.9 - 10.3 mg/dL 8.1  8.1  9.3   Total Protein 6.5 - 8.1 g/dL  6.2    Total Bilirubin 0.3 - 1.2 mg/dL  0.6    Alkaline Phos 38 - 126 U/L  53    AST 15 - 41 U/L  34    ALT 0 - 44 U/L  18      Micro  Results: Recent Results (from the past 240 hour(s))  Blood culture (routine x 2)     Status: None (Preliminary result)   Collection Time: 10/30/21 11:58 AM   Specimen: BLOOD  Result Value Ref Range Status   Specimen Description BLOOD LEFT ANTECUBITAL  Final   Special Requests   Final    BOTTLES DRAWN AEROBIC AND ANAEROBIC Blood Culture adequate volume   Culture   Final    NO GROWTH < 24 HOURS Performed at [facility], [address] [place], [place] [address]    Report Status PENDING  Incomplete  Blood culture (routine x 2)     Status: None (Preliminary result)   Collection Time: 10/30/21 12:00 PM   Specimen: BLOOD  Result Value Ref Range Status  Specimen Description BLOOD BLOOD LEFT HAND  Final   Special Requests   Final    BOTTLES DRAWN AEROBIC ONLY Blood Culture adequate volume   Culture   Final    NO GROWTH < 24 HOURS Performed at [facility], [address] [place], [place] [address]    Report Status PENDING  Incomplete   Studies/Results: ECHOCARDIOGRAM COMPLETE  Result Date: 10/31/2021    ECHOCARDIOGRAM REPORT   Patient Name:   Sonya Harmon Date of Exam: 10/31/2021 Medical Rec #:  [id number]      Height:       62.0 in Accession #:    [id number]     Weight:       104.8 lb Date of Birth:  03-Mar-1971      BSA:          1.453 m Patient Age:    38 years       BP:           121/75 mmHg Patient Gender: F              HR:           71 bpm. Exam Location:  [facility] Procedure: 2D Echo, Cardiac Doppler and Color Doppler Indications:     Syncope R55  History:         Patient has no prior history of Echocardiogram examinations.                  CAD. Tobacco abuse.  Sonographer:     Sherrie Sport Referring Phys:  [id number] Rise Mu Diagnosing Phys: Ida Rogue MD IMPRESSIONS  1. Left ventricular ejection fraction, by estimation, is 60 to 65%. The left ventricle has normal function. The left ventricle has no regional wall motion abnormalities. Left ventricular diastolic  parameters were normal.  2. Right ventricular systolic function is normal. The right ventricular size is normal. There is normal pulmonary artery systolic pressure. The estimated right ventricular systolic pressure is 29.7 mmHg.  3. The mitral valve is normal in structure. Trivial mitral valve regurgitation. No evidence of mitral stenosis.  4. The aortic valve is normal in structure. Aortic valve regurgitation is not visualized. No aortic stenosis is present.  5. The inferior vena cava is normal in size with greater than 50% respiratory variability, suggesting right atrial pressure of 3 mmHg. FINDINGS  Left Ventricle: Left ventricular ejection fraction, by estimation, is 60 to 65%. The left ventricle has normal function. The left ventricle has no regional wall motion abnormalities. The left ventricular internal cavity size was normal in size. There is  no left ventricular hypertrophy. Left ventricular diastolic parameters were normal. Right Ventricle: The right ventricular size is normal. No increase in right ventricular wall thickness. Right ventricular systolic function is normal. There is normal pulmonary artery systolic pressure. The tricuspid regurgitant velocity is 2.16 m/s, and  with an assumed right atrial pressure of 5 mmHg, the estimated right ventricular systolic pressure is 98.9 mmHg. Left Atrium: Left atrial size was normal in size. Right Atrium: Right atrial size was normal in size. Pericardium: There is no evidence of pericardial effusion. Mitral Valve: The mitral valve is normal in structure. Trivial mitral valve regurgitation. No evidence of mitral valve stenosis. Tricuspid Valve: The tricuspid valve is normal in structure. Tricuspid valve regurgitation is not demonstrated. No evidence of tricuspid stenosis. Aortic Valve: The aortic valve is normal in structure. Aortic valve regurgitation is not visualized. No aortic stenosis is present. Aortic valve mean  gradient measures 5.0 mmHg. Aortic valve  peak gradient measures 8.5 mmHg. Aortic valve area, by VTI measures 2.66 cm. Pulmonic Valve: The pulmonic valve was normal in structure. Pulmonic valve regurgitation is not visualized. No evidence of pulmonic stenosis. Aorta: The aortic root is normal in size and structure. Venous: The inferior vena cava is normal in size with greater than 50% respiratory variability, suggesting right atrial pressure of 3 mmHg. IAS/Shunts: No atrial level shunt detected by color flow Doppler.  LEFT VENTRICLE PLAX 2D LVIDd:         4.10 cm   Diastology LVIDs:         2.30 cm   LV e' medial:    7.94 cm/s LV PW:         0.90 cm   LV E/e' medial:  13.2 LV IVS:        1.00 cm   LV e' lateral:   11.70 cm/s LVOT diam:     2.00 cm   LV E/e' lateral: 9.0 LV SV:         77 LV SV Index:   53 LVOT Area:     3.14 cm  RIGHT VENTRICLE RV Basal diam:  3.50 cm RV S prime:     14.10 cm/s TAPSE (M-mode): 2.5 cm LEFT ATRIUM             Index        RIGHT ATRIUM           Index LA diam:        2.80 cm 1.93 cm/m   RA Area:     13.40 cm LA Vol (A2C):   39.7 ml 27.33 ml/m  RA Volume:   32.40 ml  22.31 ml/m LA Vol (A4C):   25.1 ml 17.28 ml/m LA Biplane Vol: 31.7 ml 21.82 ml/m  AORTIC VALVE AV Area (Vmax):    2.39 cm AV Area (Vmean):   2.16 cm AV Area (VTI):     2.66 cm AV Vmax:           146.00 cm/s AV Vmean:          101.000 cm/s AV VTI:            0.289 m AV Peak Grad:      8.5 mmHg AV Mean Grad:      5.0 mmHg LVOT Vmax:         111.00 cm/s LVOT Vmean:        69.300 cm/s LVOT VTI:          0.245 m LVOT/AV VTI ratio: 0.85  AORTA Ao Root diam: 3.20 cm MITRAL VALVE                TRICUSPID VALVE MV Area (PHT): 5.34 cm     TR Peak grad:   18.7 mmHg MV Decel Time: 142 msec     TR Vmax:        216.00 cm/s MV E velocity: 105.00 cm/s MV A velocity: 66.40 cm/s   SHUNTS MV E/A ratio:  1.58         Systemic VTI:  0.24 m                             Systemic Diam: 2.00 cm Ida Rogue MD Electronically signed by Ida Rogue MD Signature Date/Time:  10/31/2021/2:21:54 PM    Final    CT T-SPINE NO CHARGE  Result Date: 10/30/2021 CLINICAL DATA:  Syncope and fall EXAM: CT Thoracic and Lumbar spine with contrast TECHNIQUE: Multiplanar CT images of the thoracic and lumbar spine were reconstructed from contemporary CT of the Chest, Abdomen, and Pelvis. RADIATION DOSE REDUCTION: This exam was performed according to the departmental dose-optimization program which includes automated exposure control, adjustment of the mA and/or kV according to patient size and/or use of iterative reconstruction technique. CONTRAST:  100 cc Omnipaque 350 COMPARISON:  None Available. CTA chest/abdomen/pelvis 09/08/2018 FINDINGS: CT THORACIC SPINE FINDINGS Alignment: There is mild dextrocurvature centered in the midthoracic spine. There is no antero or retrolisthesis or other evidence of traumatic malalignment. Vertebrae: Vertebral body heights are preserved. There is no evidence of acute fracture. There is no suspicious osseous lesion. Paraspinal and other soft tissues: The paraspinal soft tissues are unremarkable. The heart and lungs are assessed on the separately dictated CTA chest. Disc levels: There is no significant osseous spinal canal or neural foraminal stenosis. CT LUMBAR SPINE FINDINGS Segmentation: Standard. Alignment: Normal. Vertebrae: Vertebral body heights are preserved. There is no evidence of acute fracture. There is no suspicious osseous lesion. Paraspinal and other soft tissues: The paraspinal soft tissues are unremarkable. The abdominal and pelvic viscera are assessed on the separately dictated CT abdomen/pelvis. Disc levels: There is no significant spinal canal or neural foraminal stenosis. IMPRESSION: No acute fracture or traumatic malalignment of the thoracolumbar spine. Electronically Signed   By: Valetta Mole M.D.   On: 10/30/2021 11:51   CT L-SPINE NO CHARGE  Result Date: 10/30/2021 CLINICAL DATA:  Syncope and fall EXAM: CT Thoracic and Lumbar spine with  contrast TECHNIQUE: Multiplanar CT images of the thoracic and lumbar spine were reconstructed from contemporary CT of the Chest, Abdomen, and Pelvis. RADIATION DOSE REDUCTION: This exam was performed according to the departmental dose-optimization program which includes automated exposure control, adjustment of the mA and/or kV according to patient size and/or use of iterative reconstruction technique. CONTRAST:  100 cc Omnipaque 350 COMPARISON:  None Available. CTA chest/abdomen/pelvis 09/08/2018 FINDINGS: CT THORACIC SPINE FINDINGS Alignment: There is mild dextrocurvature centered in the midthoracic spine. There is no antero or retrolisthesis or other evidence of traumatic malalignment. Vertebrae: Vertebral body heights are preserved. There is no evidence of acute fracture. There is no suspicious osseous lesion. Paraspinal and other soft tissues: The paraspinal soft tissues are unremarkable. The heart and lungs are assessed on the separately dictated CTA chest. Disc levels: There is no significant osseous spinal canal or neural foraminal stenosis. CT LUMBAR SPINE FINDINGS Segmentation: Standard. Alignment: Normal. Vertebrae: Vertebral body heights are preserved. There is no evidence of acute fracture. There is no suspicious osseous lesion. Paraspinal and other soft tissues: The paraspinal soft tissues are unremarkable. The abdominal and pelvic viscera are assessed on the separately dictated CT abdomen/pelvis. Disc levels: There is no significant spinal canal or neural foraminal stenosis. IMPRESSION: No acute fracture or traumatic malalignment of the thoracolumbar spine. Electronically Signed   By: Valetta Mole M.D.   On: 10/30/2021 11:51   CT Angio Chest/Abd/Pel for Dissection W and/or Wo Contrast  Result Date: 10/30/2021 CLINICAL DATA:  Acute aortic syndrome suspected. Bloody emesis. Syncopal episode. EXAM: CT ANGIOGRAPHY CHEST, ABDOMEN AND PELVIS TECHNIQUE: Non-contrast CT of the chest was initially  obtained. Multidetector CT imaging through the chest, abdomen and pelvis was performed using the standard protocol during bolus administration of intravenous contrast. Multiplanar reconstructed images and MIPs were obtained and reviewed to evaluate the vascular anatomy. RADIATION DOSE REDUCTION: This exam was performed according to the  departmental dose-optimization program which includes automated exposure control, adjustment of the mA and/or kV according to patient size and/or use of iterative reconstruction technique. CONTRAST:  166m OMNIPAQUE IOHEXOL 350 MG/ML SOLN COMPARISON:  CTA 09/07/2021 FINDINGS: CTA CHEST FINDINGS Cardiovascular: No aortic dissection or aneurysm. Ascending aorta is nonaneurysmal measuring 31 mm. Intimal calcification is at the origin of the LEFT subclavian artery. Otherwise great vessels are normal. Descending thoracic aorta demonstrates mild intimal calcification without aneurysm or dissection. No evidence acute pulmonary embolism. Mediastinum/Nodes: Normal esophagus and trachea.  No adenopathy. Lungs/Pleura: No pulmonary infarction. No pneumothorax. No airspace disease. Musculoskeletal: No acute osseous abnormality. Review of the MIP images confirms the above findings. CTA ABDOMEN AND PELVIS FINDINGS VASCULAR Aorta: Normal caliber aorta without aneurysm, dissection, vasculitis or significant stenosis. Celiac: Patent without evidence of aneurysm, dissection, vasculitis or significant stenosis. SMA: Patent without evidence of aneurysm, dissection, vasculitis or significant stenosis. Again demonstrated smooth narrowing along the dorsal margin of the vessel with reconstitution. No change from prior. Renals: Both renal arteries are patent without evidence of aneurysm, dissection, vasculitis, fibromuscular dysplasia or significant stenosis. IMA: Patent without evidence of aneurysm, dissection, vasculitis or significant stenosis. Inflow: Calcific plaque with stenosis of the LEFT common  femoral artery. Reconstitution. No change from prior. Veins: No obvious venous abnormality within the limitations of this arterial phase study. Review of the MIP images confirms the above findings. NON-VASCULAR Hepatobiliary: No focal hepatic lesion. No biliary duct dilatation. Common bile duct is normal. Pancreas: Pancreas is normal. No ductal dilatation. No pancreatic inflammation. Spleen: Normal spleen Adrenals/urinary tract: Adrenal glands and kidneys are normal. The ureters and bladder normal. Stomach/Bowel: Stomach, small bowel, appendix, and cecum are normal. The colon and rectosigmoid colon are normal. Vascular/Lymphatic: Abdominal aorta is normal caliber. No periportal or retroperitoneal adenopathy. No pelvic adenopathy. Reproductive: Uterus and adnexa unremarkable. Other: No free fluid. Musculoskeletal: No aggressive osseous lesion. Review of the MIP images confirms the above findings. IMPRESSION: Chest Impression: 1. No evidence of aortic dissection or aneurysm. 2. 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 3. No evidence acute pulmonary embolism. 4. No acute pulmonary parenchymal findings. Abdomen / Pelvis Impression: 1. No acute findings the abdominal aorta or branches. Atherosclerotic calcification again noted. No change in proximal stenosis of the SMA and LEFT common iliac artery compared to CT a 09/07/2021. 2. No acute findings in the abdomen pelvis. Electronically Signed   By: SSuzy BouchardM.D.   On: 10/30/2021 11:39   CT HEAD WO CONTRAST (5MM)  Result Date: 10/30/2021 CLINICAL DATA:  Head trauma, moderate-severe; Facial trauma, blunt; Neck trauma, dangerous injury mechanism (Age 49-64y EXAM: CT HEAD WITHOUT CONTRAST CT MAXILLOFACIAL WITHOUT CONTRAST CT CERVICAL SPINE WITHOUT CONTRAST TECHNIQUE: Multidetector CT imaging of the head, cervical spine, and maxillofacial structures were performed using the standard protocol without intravenous contrast.  Multiplanar CT image reconstructions of the cervical spine and maxillofacial structures were also generated. RADIATION DOSE REDUCTION: This exam was performed according to the departmental dose-optimization program which includes automated exposure control, adjustment of the mA and/or kV according to patient size and/or use of iterative reconstruction technique. COMPARISON:  None Available. FINDINGS: CT HEAD FINDINGS Brain: No evidence of acute infarction, hemorrhage, hydrocephalus, extra-axial collection or mass lesion/mass effect. Vascular: No hyperdense vessel. Skull: No acute fracture. Other: No mastoid effusions. CT MAXILLOFACIAL FINDINGS Osseous: No fracture or mandibular dislocation. No destructive process. Orbits: Negative. No traumatic or inflammatory finding. Sinuses: Minimal inferior maxillary sinus mucosal thickening. Otherwise, clear. Soft  tissues: Negative. CT CERVICAL SPINE FINDINGS Alignment: Reversal of normal cervical lordosis. Skull base and vertebrae: Vertebral body heights are maintained. No acute fracture Soft tissues and spinal canal: No prevertebral fluid or swelling. No visible canal hematoma. Disc levels:  No significant focal bony degenerative change. Upper chest: Visualized lung apices are clear. IMPRESSION: 1. No evidence of acute intracranial abnormality or facial fracture. 2. No evidence of acute fracture or traumatic malalignment the cervical spine. Electronically Signed   By: Margaretha Sheffield M.D.   On: 10/30/2021 11:28   CT Cervical Spine Wo Contrast  Result Date: 10/30/2021 CLINICAL DATA:  Head trauma, moderate-severe; Facial trauma, blunt; Neck trauma, dangerous injury mechanism (Age 20-64y) EXAM: CT HEAD WITHOUT CONTRAST CT MAXILLOFACIAL WITHOUT CONTRAST CT CERVICAL SPINE WITHOUT CONTRAST TECHNIQUE: Multidetector CT imaging of the head, cervical spine, and maxillofacial structures were performed using the standard protocol without intravenous contrast. Multiplanar CT image  reconstructions of the cervical spine and maxillofacial structures were also generated. RADIATION DOSE REDUCTION: This exam was performed according to the departmental dose-optimization program which includes automated exposure control, adjustment of the mA and/or kV according to patient size and/or use of iterative reconstruction technique. COMPARISON:  None Available. FINDINGS: CT HEAD FINDINGS Brain: No evidence of acute infarction, hemorrhage, hydrocephalus, extra-axial collection or mass lesion/mass effect. Vascular: No hyperdense vessel. Skull: No acute fracture. Other: No mastoid effusions. CT MAXILLOFACIAL FINDINGS Osseous: No fracture or mandibular dislocation. No destructive process. Orbits: Negative. No traumatic or inflammatory finding. Sinuses: Minimal inferior maxillary sinus mucosal thickening. Otherwise, clear. Soft tissues: Negative. CT CERVICAL SPINE FINDINGS Alignment: Reversal of normal cervical lordosis. Skull base and vertebrae: Vertebral body heights are maintained. No acute fracture Soft tissues and spinal canal: No prevertebral fluid or swelling. No visible canal hematoma. Disc levels:  No significant focal bony degenerative change. Upper chest: Visualized lung apices are clear. IMPRESSION: 1. No evidence of acute intracranial abnormality or facial fracture. 2. No evidence of acute fracture or traumatic malalignment the cervical spine. Electronically Signed   By: Margaretha Sheffield M.D.   On: 10/30/2021 11:28   CT Maxillofacial Wo Contrast  Result Date: 10/30/2021 CLINICAL DATA:  Head trauma, moderate-severe; Facial trauma, blunt; Neck trauma, dangerous injury mechanism (Age 66-64y) EXAM: CT HEAD WITHOUT CONTRAST CT MAXILLOFACIAL WITHOUT CONTRAST CT CERVICAL SPINE WITHOUT CONTRAST TECHNIQUE: Multidetector CT imaging of the head, cervical spine, and maxillofacial structures were performed using the standard protocol without intravenous contrast. Multiplanar CT image reconstructions of the  cervical spine and maxillofacial structures were also generated. RADIATION DOSE REDUCTION: This exam was performed according to the departmental dose-optimization program which includes automated exposure control, adjustment of the mA and/or kV according to patient size and/or use of iterative reconstruction technique. COMPARISON:  None Available. FINDINGS: CT HEAD FINDINGS Brain: No evidence of acute infarction, hemorrhage, hydrocephalus, extra-axial collection or mass lesion/mass effect. Vascular: No hyperdense vessel. Skull: No acute fracture. Other: No mastoid effusions. CT MAXILLOFACIAL FINDINGS Osseous: No fracture or mandibular dislocation. No destructive process. Orbits: Negative. No traumatic or inflammatory finding. Sinuses: Minimal inferior maxillary sinus mucosal thickening. Otherwise, clear. Soft tissues: Negative. CT CERVICAL SPINE FINDINGS Alignment: Reversal of normal cervical lordosis. Skull base and vertebrae: Vertebral body heights are maintained. No acute fracture Soft tissues and spinal canal: No prevertebral fluid or swelling. No visible canal hematoma. Disc levels:  No significant focal bony degenerative change. Upper chest: Visualized lung apices are clear. IMPRESSION: 1. No evidence of acute intracranial abnormality or facial fracture. 2. No evidence of acute fracture  or traumatic malalignment the cervical spine. Electronically Signed   By: Margaretha Sheffield M.D.   On: 10/30/2021 11:28   DG Elbow Complete Left  Result Date: 10/30/2021 CLINICAL DATA:  Syncopal episode.  Fell.  Left elbow pain. EXAM: LEFT ELBOW - COMPLETE 3+ VIEW COMPARISON:  None Available. FINDINGS: The joint spaces are maintained. No acute elbow fracture. No osteochondral lesion. No joint effusion. IMPRESSION: No acute bony findings. Electronically Signed   By: Marijo Sanes M.D.   On: 10/30/2021 10:37   Medications: I have reviewed the patient's current medications. Prior to Admission:  Medications Prior to  Admission  Medication Sig Dispense Refill Last Dose   aspirin 81 MG chewable tablet Chew 1 tablet (81 mg total) by mouth daily.   10/29/2021   atorvastatin (LIPITOR) 80 MG tablet Take 1 tablet (80 mg total) by mouth daily. 30 tablet 2 10/29/2021   clopidogrel (PLAVIX) 75 MG tablet Take 1 tablet (75 mg total) by mouth daily. 30 tablet 11 10/29/2021 at 0800   metoprolol tartrate (LOPRESSOR) 25 MG tablet Take 0.5 tablets (12.5 mg total) by mouth 2 (two) times daily. 30 tablet 2 10/29/2021   Scheduled:  (feeding supplement) PROSource Plus  30 mL Oral TID BM   atorvastatin  80 mg Oral QHS   cyanocobalamin  1,000 mcg Subcutaneous Once   feeding supplement  1 Container Oral TID BM   folic acid  1 mg Oral Daily   multivitamin with minerals  1 tablet Oral Daily   [START ON 11/02/2021] pantoprazole  40 mg Intravenous Q12H   thiamine  100 mg Oral Daily   Or   thiamine  100 mg Intravenous Daily   Continuous:  iron sucrose 300 mg (10/31/21 1529)   pantoprazole 8 mg/hr (10/31/21 0916)   [id number] **OR** acetaminophen, LORazepam **OR** LORazepam, midodrine, morphine injection, ondansetron **OR** ondansetron (ZOFRAN) IV, senna-docusate Anti-infectives (From admission, onward)    None      Scheduled Meds:  (feeding supplement) PROSource Plus  30 mL Oral TID BM   atorvastatin  80 mg Oral QHS   cyanocobalamin  1,000 mcg Subcutaneous Once   feeding supplement  1 Container Oral TID BM   folic acid  1 mg Oral Daily   multivitamin with minerals  1 tablet Oral Daily   [START ON 11/02/2021] pantoprazole  40 mg Intravenous Q12H   thiamine  100 mg Oral Daily   Or   thiamine  100 mg Intravenous Daily   Continuous Infusions:  iron sucrose 300 mg (10/31/21 1529)   pantoprazole 8 mg/hr (10/31/21 0916)   PRN Meds:.acetaminophen **OR** acetaminophen, LORazepam **OR** LORazepam, midodrine, morphine injection, ondansetron **OR** ondansetron (ZOFRAN) IV, senna-docusate   Assessment: Principal  Problem:   Upper GI bleed Active Problems:   Abdominal pain   Tobacco dependence   Alcohol abuse   Hepatic steatosis   PAD (peripheral artery disease) (HCC)   Hyponatremia   Traumatic ecchymosis of face   CAD S/P percutaneous coronary angioplasty   Sonya Harmon is a 51 y.o. female with history of heavy tobacco use, coronary artery disease s/p PCI to RCA in 09/2021, left iliac artery angioplasty in 10/2021, alcohol abuse presented with syncope, coffee-ground emesis in setting of DAPT  Plan: Acute upper GI bleed resulting in acute blood loss anemia: BUN is improving Patient had a drop in hemoglobin from 8.6 to 6.8 within last 24 hours.  Likely combination of blood loss, IV fluids currently receiving 2 units of PRBCs Continue pantoprazole drip DAPT is  on hold N.p.o. effective 5AM tomorrow Continue liquid diet Plan for upper endoscopy tomorrow Discussed with patient to completely avoid NSAID use, complete abstinence from alcohol use Patient will have to stay on Protonix 40 mg p.o. twice daily before meals long-term as long as she stays on DAPT   LOS: 0 days   Shawntay Prest 10/31/2021, 4:29 PM

## 2021-10-31 NOTE — Progress Notes (Signed)
Initial Nutrition Assessment  DOCUMENTATION CODES:   Not applicable  INTERVENTION:   -Boost Breeze po TID, each supplement provides 250 kcal and 9 grams of protein  -30 ml Prosource plus TID, each supplement provides 100 kcals and 15 grams protein -MVI with minerals daily  NUTRITION DIAGNOSIS:   Inadequate oral intake related to poor appetite as evidenced by per patient/family report, meal completion < 50%.  GOAL:   Patient will meet greater than or equal to 90% of their needs  MONITOR:   PO intake, Supplement acceptance, Diet advancement  REASON FOR ASSESSMENT:   Malnutrition Screening Tool    ASSESSMENT:   Pt with history of alcohol abuse, tobacco dependence, CAD status post PCI in 09/10/2021, who presents for chief concerns of syncope.  Pt admitted with upper GIB.  Reviewed I/O's: +1.9 L x 24 hours  UOP: 850 ml x 24 hours  Noted pt with history of ETOH abuse.   Per GI notes, plan for EGD tomorrow.   Spoke with pt at bedside. Noted pt consumed about 1.2 cup of ice cream for breakfast. Pt shares that she does not have a good appetite at baseline. She generally consumes one meal per day and will also snack on potato chips and candy throughout the day. Pt shares that food is just not appealing to her.   Per pt, her UBW is around 120#. She shares that she was steadily gaining weight after being prescribed an appetite stimulant, however, this was discontinued after she lost her insurance. Pt reports she has lost about 3 pounds in the past month and weighed 103# last month at her doctors appointment. Reviewed wt hx; pt wt has been stable over the past month.   Discussed importance of good meal and supplement intake to promote healing. Pt amenable to supplements.   Medications reviewed and include vitamin P-54, folic acid, and vitamin B-1.   Labs reviewed: K: 3.3.   NUTRITION - FOCUSED PHYSICAL EXAM:  Flowsheet Row Most Recent Value  Orbital Region No depletion   Upper Arm Region No depletion  Thoracic and Lumbar Region No depletion  Buccal Region No depletion  Temple Region No depletion  Clavicle Bone Region No depletion  Clavicle and Acromion Bone Region No depletion  Scapular Bone Region No depletion  Dorsal Hand No depletion  Patellar Region Mild depletion  Anterior Thigh Region Mild depletion  Posterior Calf Region Mild depletion  Edema (RD Assessment) None  Hair Reviewed  Eyes Reviewed  Mouth Reviewed  Skin Reviewed  Nails Reviewed       Diet Order:   Diet Order             Diet clear liquid Room service appropriate? Yes; Fluid consistency: Thin  Diet effective now                   EDUCATION NEEDS:   Education needs have been addressed  Skin:  Skin Assessment: Reviewed RN Assessment  Last BM:  10/31/21 (type 7)  Height:   Ht Readings from Last 1 Encounters:  10/31/21 '5\' 2"'$  (1.575 m)    Weight:   Wt Readings from Last 1 Encounters:  10/31/21 47.5 kg    Ideal Body Weight:  50 kg  BMI:  Body mass index is 19.17 kg/m.  Estimated Nutritional Needs:   Kcal:  1700-190  Protein:  90-105 grams  Fluid:  > 1.7 L    Loistine Chance, RD, LDN, [place] Registered Dietitian II Certified Diabetes Care and Education Specialist  Please refer to [facility] for RD and/or RD on-call/weekend/after hours pager

## 2021-10-31 NOTE — Progress Notes (Signed)
Progress Note  Patient Name: Sonya Harmon Date of Encounter: 10/31/2021  Primary Cardiologist: Fletcher Anon  Subjective   She had two episodes of melena this morning. HGB trended down to 6.8 this morning. No symptoms of angina or decompensation. Feels weak. BP soft overnight, stable this morning.   Inpatient Medications    Scheduled Meds:  atorvastatin  80 mg Oral QHS   folic acid  1 mg Oral Daily   multivitamin with minerals  1 tablet Oral Daily   [START ON 11/02/2021] pantoprazole  40 mg Intravenous Q12H   thiamine  100 mg Oral Daily   Or   thiamine  100 mg Intravenous Daily   Continuous Infusions:  sodium chloride 125 mL/hr at 10/31/21 0049   pantoprazole 8 mg/hr (10/30/21 2051)   PRN Meds: acetaminophen **OR** acetaminophen, LORazepam **OR** LORazepam, midodrine, morphine injection, ondansetron **OR** ondansetron (ZOFRAN) IV, senna-docusate   Vital Signs    Vitals:   10/30/21 2330 10/31/21 0222 10/31/21 0234 10/31/21 0444  BP: (!) 88/60 115/67  106/75  Pulse: 75 66  70  Resp: '16 11  19  '$ Temp: 98.3 F (36.8 C) 98 F (36.7 C)  98.2 F (36.8 C)  TempSrc: Oral Oral    SpO2: 100% 100%  100%  Weight:   47.5 kg   Height:   '5\' 2"'$  (1.575 m)     Intake/Output Summary (Last 24 hours) at 10/31/2021 0732 Last data filed at 10/31/2021 0500 Gross per 24 hour  Intake 2752.85 ml  Output 850 ml  Net 1902.85 ml   Filed Weights   10/30/21 0959 10/31/21 0234  Weight: 45 kg 47.5 kg    Telemetry    SR with rates in the 60s to 70s bpm - Personally Reviewed  ECG    No new tracings - Personally Reviewed  Physical Exam   GEN: No acute distress.   Neck: No JVD. Cardiac: RRR, no murmurs, rubs, or gallops.  Respiratory: Clear to auscultation bilaterally.  GI: Soft, nontender, non-distended.   MS: No edema; No deformity. Neuro:  Alert and oriented x 3; Nonfocal.  Psych: Normal affect.  Labs    Chemistry Recent Labs  Lab 10/30/21 1001 10/31/21 0515  NA 129* 139   K 3.7 3.3*  CL 97* 111  CO2 19* 22  GLUCOSE 123* 88  BUN 32* 17  CREATININE 0.82 0.72  CALCIUM 8.1* 8.1*  PROT 6.2*  --   ALBUMIN 3.5  --   AST 34  --   ALT 18  --   ALKPHOS 53  --   BILITOT 0.6  --   GFRNONAA >60 >60  ANIONGAP 13 6     Hematology Recent Labs  Lab 10/30/21 1001 10/31/21 0515  WBC 11.5* 5.8  RBC 2.69* 2.16*  HGB 8.6* 6.8*  HCT 25.9* 20.4*  MCV 96.3 94.4  MCH 32.0 31.5  MCHC 33.2 33.3  RDW 13.8 13.8  PLT 342 251    Cardiac EnzymesNo results for input(s): "TROPONINI" in the last 168 hours. No results for input(s): "TROPIPOC" in the last 168 hours.   BNPNo results for input(s): "BNP", "PROBNP" in the last 168 hours.   DDimer No results for input(s): "DDIMER" in the last 168 hours.   Radiology    CT T-SPINE NO CHARGE  Result Date: 10/30/2021 IMPRESSION: No acute fracture or traumatic malalignment of the thoracolumbar spine. Electronically Signed   By: Valetta Mole M.D.   On: 10/30/2021 11:51   CT L-SPINE NO CHARGE  Result Date: 10/30/2021 IMPRESSION: No acute fracture or traumatic malalignment of the thoracolumbar spine. Electronically Signed   By: Valetta Mole M.D.   On: 10/30/2021 11:51   CT Angio Chest/Abd/Pel for Dissection W and/or Wo Contrast  Result Date: 10/30/2021 IMPRESSION: Chest Impression: 1. No evidence of aortic dissection or aneurysm. 2. 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 3. No evidence acute pulmonary embolism. 4. No acute pulmonary parenchymal findings. Abdomen / Pelvis Impression: 1. No acute findings the abdominal aorta or branches. Atherosclerotic calcification again noted. No change in proximal stenosis of the SMA and LEFT common iliac artery compared to CT a 09/07/2021. 2. No acute findings in the abdomen pelvis. Electronically Signed   By: Suzy Bouchard M.D.   On: 10/30/2021 11:39   CT HEAD WO CONTRAST (5MM)  Result Date: 10/30/2021 IMPRESSION: 1. No evidence of acute  intracranial abnormality or facial fracture. 2. No evidence of acute fracture or traumatic malalignment the cervical spine. Electronically Signed   By: Margaretha Sheffield M.D.   On: 10/30/2021 11:28   CT Cervical Spine Wo Contrast  Result Date: 10/30/2021 IMPRESSION: 1. No evidence of acute intracranial abnormality or facial fracture. 2. No evidence of acute fracture or traumatic malalignment the cervical spine. Electronically Signed   By: Margaretha Sheffield M.D.   On: 10/30/2021 11:28   CT Maxillofacial Wo Contrast  Result Date: 10/30/2021 IMPRESSION: 1. No evidence of acute intracranial abnormality or facial fracture. 2. No evidence of acute fracture or traumatic malalignment the cervical spine. Electronically Signed   By: Margaretha Sheffield M.D.   On: 10/30/2021 11:28   DG Elbow Complete Left  Result Date: 10/30/2021 IMPRESSION: No acute bony findings. Electronically Signed   By: Marijo Sanes M.D.   On: 10/30/2021 10:37    Cardiac Studies     Patient Profile     51 y.o. female with history of CAD status post PCI to the RCA in 09/2021, PAD status post stenting to the left external iliac artery with drug-coated balloon angioplasty to the distal left common iliac artery, tongue cancer, HLD, tobacco use, alcohol use  who is being seen today for the evaluation of DAPT in the context of hematemesis at the request of Dr. Tobie Poet.  Assessment & Plan    1. Syncope with hypovolemic hypotension:  -Likely in the context of volume depletion, poor oral intake, and acute blood loss anemia  -Hemodynamically stable -Monitor on telemetry  -Echo pending  2. Hematemesis and melena with acute blood loss anemia:  -HGB trended to 6.8, two episodes of melena this morning  -Continue to hold ASA/Plavix  -IV PPI  -Avoid NSAIDs, has been taking ibuprofen daily for the past month -Possibly exacerbated by alcohol use  -Maintain hemoglobin greater than 8.0  -Transfuse per IM recommendations  -If endoscopy is  needed, she may proceed from a cardiac perspective at an overall moderate risk without further testing -Appreciate GI assistance   3. CAD involving the native coronary arteries status post recent PCI to the RCA in 09/2021:  -No symptoms of angina or decompensation  -High sensitivity troponin negative x2, EKG without acute ischemic changes  -DAPT on hold given concern for acute GI bleed  -Interventional cardiology recommends resuming clopidogrel without aspirin in 1 to 2 days, or when safe to do so from a GI perspective  -Hold metoprolol for now with hypotension  -No plans for inpatient ischemic testing  4. PAD:  -Status post recent lower extremity stenting  and drug-coated balloon angioplasty  -Hold antiplatelet therapy with GI bleed, plan is to resume clopidogrel without aspirin in 1 to 2 days, only when safe to do so   5. HLD:  -Target LDL less than 70  -PTA atorvastatin 80 mg when taking oral medications       For questions or updates, please contact [place] Please consult [email or web address] for contact info under Cardiology/STEMI.    Signed, Christell Faith, PA-C [place] Pager: [phone] 10/31/2021, 7:32 AM

## 2021-10-31 NOTE — Progress Notes (Addendum)
Patient brought to room from ED in bed. Patient is alert and oriented x4. Protonix drip infusing at 68m/hr and NS infusing at 1239mhr. Patient request for perwick. Safetry precautions reiterated to patient. Verbalizes understanding. Steady gait. Prior to admission, patient had a fall at home. On room air. Able to move self in bed. Currently on full liquid diet at this time. Requests for ice water. No c/o pain or distress noted. Skin assessment done with charge nurse, AlDelrae RendN. Scattered bruises noted to both legs, arms, and left hip. Patient states that it came from her dogs. Call light within reach. Resp even and unlabored. Perwick intact and working. Bed alarm intact and working appropriately. Side rails up x 2. Refuses socks at this time but verbalizes understanding if she gets out of bed that she would need her gripper socks. Belongings placed close to patient. Denies needs. Vitals stable. Oriented to room and staff.

## 2021-10-31 NOTE — Progress Notes (Signed)
  Transition of Care [facility]) Screening Note   Patient Details  Name: Sonya Harmon Date of Birth: 11/05/1970   Transition of Care [facility]) CM/SW Contact:    Alberteen Sam, LCSW Phone Number: 10/31/2021, 3:49 PM   CSW notes per patient hx of admissions, was provided list of PCP's in network with insurance and SA resources last recent admission.   Transition of Care Department [facility]) has reviewed patient and no TOC needs have been identified at this time. We will continue to monitor patient advancement through interdisciplinary progression rounds. If new patient transition needs arise, please place a TOC consult.  Pricilla Riffle, LCSW

## 2021-10-31 NOTE — Plan of Care (Signed)
  Problem: Activity: Goal: Ability to return to baseline activity level will improve Outcome: Progressing   Problem: Cardiovascular: Goal: Ability to achieve and maintain adequate cardiovascular perfusion will improve Outcome: Progressing   Problem: Cardiovascular: Goal: Vascular access site(s) Level 0-1 will be maintained Outcome: Progressing

## 2021-11-01 ENCOUNTER — Inpatient Hospital Stay: Payer: Commercial Managed Care - HMO | Admitting: Certified Registered"

## 2021-11-01 ENCOUNTER — Encounter
Admission: EM | Disposition: A | Discharge status: Home / Self Care | Payer: Self-pay | Source: Home / Self Care | Attending: Internal Medicine

## 2021-11-01 ENCOUNTER — Encounter: Payer: Self-pay | Admitting: Internal Medicine

## 2021-11-01 DIAGNOSIS — K279 Peptic ulcer, site unspecified, unspecified as acute or chronic, without hemorrhage or perforation: Secondary | ICD-10-CM | POA: Diagnosis not present

## 2021-11-01 DIAGNOSIS — K259 Gastric ulcer, unspecified as acute or chronic, without hemorrhage or perforation: Secondary | ICD-10-CM

## 2021-11-01 DIAGNOSIS — K2980 Duodenitis without bleeding: Secondary | ICD-10-CM | POA: Diagnosis not present

## 2021-11-01 DIAGNOSIS — F1010 Alcohol abuse, uncomplicated: Secondary | ICD-10-CM | POA: Diagnosis not present

## 2021-11-01 HISTORY — PX: ESOPHAGOGASTRODUODENOSCOPY (EGD) WITH PROPOFOL: SHX5813

## 2021-11-01 LAB — TYPE AND SCREEN
ABO/RH(D): O NEG
Antibody Screen: NEGATIVE
Unit division: 0

## 2021-11-01 LAB — BASIC METABOLIC PANEL
Anion gap: 3 — ABNORMAL LOW (ref 5–15)
BUN: 8 mg/dL (ref 6–20)
CO2: 23 mmol/L (ref 22–32)
Calcium: 8.3 mg/dL — ABNORMAL LOW (ref 8.9–10.3)
Chloride: 111 mmol/L (ref 98–111)
Creatinine, Ser: 0.54 mg/dL (ref 0.44–1.00)
GFR, Estimated: >60 mL/min (ref >60)
Glucose, Bld: 94 mg/dL (ref 70–99)
Potassium: 3.2 mmol/L — ABNORMAL LOW (ref 3.5–5.1)
Sodium: 137 mmol/L (ref 135–145)

## 2021-11-01 LAB — BPAM RBC
Blood Product Expiration Date: 202308192359
ISSUE DATE / TIME: 202307281053
Unit Type and Rh: 9500

## 2021-11-01 LAB — CBC
HCT: 26.8 % — ABNORMAL LOW (ref 36.0–46.0)
Hemoglobin: 8.8 g/dL — ABNORMAL LOW (ref 12.0–15.0)
MCH: 31.4 pg (ref 26.0–34.0)
MCHC: 32.8 g/dL (ref 30.0–36.0)
MCV: 95.7 fL (ref 80.0–100.0)
Platelets: 216 10*3/uL (ref 150–400)
RBC: 2.8 MIL/uL — ABNORMAL LOW (ref 3.87–5.11)
RDW: 14.8 % (ref 11.5–15.5)
WBC: 5.5 10*3/uL (ref 4.0–10.5)
nRBC: 0 % (ref 0.0–0.2)

## 2021-11-01 SURGERY — ESOPHAGOGASTRODUODENOSCOPY (EGD) WITH PROPOFOL
Anesthesia: General

## 2021-11-01 MED ORDER — LIDOCAINE HCL (PF) 2 % IJ SOLN
INTRAMUSCULAR | Status: AC
  Filled 2021-11-01: qty 5

## 2021-11-01 MED ORDER — VITAMIN B-12 1000 MCG PO TABS
1000.0000 ug | ORAL_TABLET | Freq: Every day | ORAL | Status: DC
Start: 2021-11-01 — End: 2021-11-02
  Administered 2021-11-01 – 2021-11-02 (×2): 1000 ug via ORAL
  Filled 2021-11-01 (×2): qty 1

## 2021-11-01 MED ORDER — MIDAZOLAM HCL 2 MG/2ML IJ SOLN
INTRAMUSCULAR | Status: DC | PRN
  Administered 2021-11-01 (×2): 1 mg via INTRAVENOUS

## 2021-11-01 MED ORDER — SODIUM CHLORIDE 0.9 % IV SOLN
300.0000 mg | Freq: Once | INTRAVENOUS | Status: AC
  Administered 2021-11-02: 300 mg via INTRAVENOUS
  Filled 2021-11-01: qty 300

## 2021-11-01 MED ORDER — PROPOFOL 1000 MG/100ML IV EMUL
INTRAVENOUS | Status: AC
  Filled 2021-11-01: qty 200

## 2021-11-01 MED ORDER — EPHEDRINE SULFATE (PRESSORS) 50 MG/ML IJ SOLN
INTRAMUSCULAR | Status: DC | PRN
  Administered 2021-11-01: 15 mg via INTRAVENOUS

## 2021-11-01 MED ORDER — CLOPIDOGREL BISULFATE 75 MG PO TABS
75.0000 mg | ORAL_TABLET | Freq: Every day | ORAL | Status: DC
  Administered 2021-11-01 – 2021-11-02 (×2): 75 mg via ORAL
  Filled 2021-11-01 (×2): qty 1

## 2021-11-01 MED ORDER — LIDOCAINE HCL (CARDIAC) PF 100 MG/5ML IV SOSY
PREFILLED_SYRINGE | INTRAVENOUS | Status: DC | PRN
  Administered 2021-11-01: 60 mg via INTRAVENOUS

## 2021-11-01 MED ORDER — EPHEDRINE 5 MG/ML INJ
INTRAVENOUS | Status: AC
  Filled 2021-11-01: qty 5

## 2021-11-01 MED ORDER — MIDAZOLAM HCL 2 MG/2ML IJ SOLN
INTRAMUSCULAR | Status: AC
  Filled 2021-11-01: qty 2

## 2021-11-01 MED ORDER — SODIUM CHLORIDE 0.9 % IV SOLN: INTRAVENOUS | Status: DC

## 2021-11-01 MED ORDER — PROPOFOL 10 MG/ML IV BOLUS
INTRAVENOUS | Status: DC | PRN
  Administered 2021-11-01: 70 mg via INTRAVENOUS
  Administered 2021-11-01: 30 mg via INTRAVENOUS
  Administered 2021-11-01 (×2): 20 mg via INTRAVENOUS
  Administered 2021-11-01: 30 mg via INTRAVENOUS

## 2021-11-01 MED ORDER — POTASSIUM CHLORIDE CRYS ER 20 MEQ PO TBCR
40.0000 meq | EXTENDED_RELEASE_TABLET | Freq: Once | ORAL | Status: AC
  Administered 2021-11-01: 40 meq via ORAL
  Filled 2021-11-01: qty 2

## 2021-11-01 NOTE — Progress Notes (Signed)
PROGRESS NOTE    Sonya Harmon  DJM:[id number] DOB: 19-Sep-1970 DOA: 10/30/2021 PCP: Leonides Sake, MD  Assessment & Plan:   Principal Problem:   Upper GI bleed Active Problems:   Tobacco dependence   Alcohol abuse   Abdominal pain   Hepatic steatosis   PAD (peripheral artery disease) (HCC)   Hyponatremia   Traumatic ecchymosis of face   CAD S/P percutaneous coronary angioplasty  Assessment and Plan:  Upper GI bleed: likely secondary to alcohol abuse. Continue on PPI. S/p 1 unit of pRBCs transfused. H&H are trending up. S/p EGD which showed alcoholic duodenitis, non-obstructing non-bleeding gastric ulcers w/ a clear ulcer base, biopsied & small hiatal hernia.     Alcohol abuse: continue on CIWA protocol. Received alcohol cessation counseling   Hypokalemia: KCl repleated   Tobacco dependence: quit smoking in June 2023   Hx of CAD: s/p percutaneous coronary angioplasty. Restart home dose of plavix and hold aspirin as per cardio. Hold metoprolol & can likely restart tomorrow    Traumatic ecchymosis of face: present on admission. Of left jaw and left side of her face, secondary to traumatic syncope at home   Hyponatremia: resolved    Hepatic steatosis: likely secondary to alcohol abuse       DVT prophylaxis: SCDs Code Status: full  Family Communication: Disposition Plan:  likely d/c back home    Level of care: Progressive  Status is: Inpatient Remains inpatient appropriate because: severity of illness      Consultants:  GI Cardio   Procedures:   Antimicrobials:   Subjective: Pt c/o fatigue  Objective: Vitals:   10/31/21 2101 10/31/21 2345 11/01/21 0419 11/01/21 0734  BP: 122/70 (!) 103/56 (!) 104/54 116/67  Pulse: 62 73 61 61  Resp: '18 18 16 17  '$ Temp: 98.4 F (36.9 C) 98.4 F (36.9 C) 97.9 F (36.6 C) 98.1 F (36.7 C)  TempSrc:      SpO2: 100% 99% 99% 100%  Weight:      Height:        Intake/Output Summary (Last 24 hours) at  11/01/2021 0811 Last data filed at 11/01/2021 0600 Gross per 24 hour  Intake 2702.19 ml  Output 1951 ml  Net 751.19 ml   Filed Weights   10/30/21 0959 10/31/21 0234  Weight: 45 kg 47.5 kg    Examination:  General exam: Appears comfortable  Respiratory system: clear breath sounds b/l  Cardiovascular system: S1/S2+. No gallops or rubs   Gastrointestinal system: Abd is soft, NT, ND & hypoactive bowel sounds  Central nervous system: Alert and oriented. Moves all extremities  Psychiatry: judgement and insight appear at baseline. Tearful mood     Data Reviewed: I have personally reviewed following labs and imaging studies  CBC: Recent Labs  Lab 10/30/21 1001 10/31/21 0515 10/31/21 2017 11/01/21 0719  WBC 11.5* 5.8  --  5.5  HGB 8.6* 6.8* 8.9* 8.8*  HCT 25.9* 20.4* 26.1* 26.8*  MCV 96.3 94.4  --  95.7  PLT 342 251  --  222   Basic Metabolic Panel: Recent Labs  Lab 10/30/21 1001 10/31/21 0515  NA 129* 139  K 3.7 3.3*  CL 97* 111  CO2 19* 22  GLUCOSE 123* 88  BUN 32* 17  CREATININE 0.82 0.72  CALCIUM 8.1* 8.1*  MG 1.9  --    GFR: Estimated Creatinine Clearance: 63.8 mL/min (by C-G formula based on SCr of 0.72 mg/dL). Liver Function Tests: Recent Labs  Lab 10/30/21 1001  AST  34  ALT 18  ALKPHOS 53  BILITOT 0.6  PROT 6.2*  ALBUMIN 3.5   No results for input(s): "LIPASE", "AMYLASE" in the last 168 hours. No results for input(s): "AMMONIA" in the last 168 hours. Coagulation Profile: Recent Labs  Lab 10/30/21 1001  INR 1.1   Cardiac Enzymes: No results for input(s): "CKTOTAL", "CKMB", "CKMBINDEX", "TROPONINI" in the last 168 hours. BNP (last 3 results) No results for input(s): "PROBNP" in the last 8760 hours. HbA1C: No results for input(s): "HGBA1C" in the last 72 hours. CBG: No results for input(s): "GLUCAP" in the last 168 hours. Lipid Profile: No results for input(s): "CHOL", "HDL", "LDLCALC", "TRIG", "CHOLHDL", "LDLDIRECT" in the last 72  hours. Thyroid Function Tests: No results for input(s): "TSH", "T4TOTAL", "FREET4", "T3FREE", "THYROIDAB" in the last 72 hours. Anemia Panel: Recent Labs    10/30/21 1159 10/31/21 0515  VITAMINB12  --  170*  FOLATE 7.2  --   FERRITIN 20  --   TIBC 351  --   IRON 56  --    Sepsis Labs: Recent Labs  Lab 10/30/21 1006 10/30/21 1159  LATICACIDVEN 4.2* 3.6*    Recent Results (from the past 240 hour(s))  Blood culture (routine x 2)     Status: None (Preliminary result)   Collection Time: 10/30/21 11:58 AM   Specimen: BLOOD  Result Value Ref Range Status   Specimen Description BLOOD LEFT ANTECUBITAL  Final   Special Requests   Final    BOTTLES DRAWN AEROBIC AND ANAEROBIC Blood Culture adequate volume   Culture   Final    NO GROWTH < 24 HOURS Performed at [facility], [address], [place], [place] [address]    Report Status PENDING  Incomplete  Blood culture (routine x 2)     Status: None (Preliminary result)   Collection Time: 10/30/21 12:00 PM   Specimen: BLOOD  Result Value Ref Range Status   Specimen Description BLOOD BLOOD LEFT HAND  Final   Special Requests   Final    BOTTLES DRAWN AEROBIC ONLY Blood Culture adequate volume   Culture   Final    NO GROWTH < 24 HOURS Performed at [facility], [address], [place], [place] [address]    Report Status PENDING  Incomplete         Radiology Studies: ECHOCARDIOGRAM COMPLETE  Result Date: 10/31/2021    ECHOCARDIOGRAM REPORT   Patient Name:   Sonya Harmon Date of Exam: 10/31/2021 Medical Rec #:  [id number]      Height:       62.0 in Accession #:    [id number]     Weight:       104.8 lb Date of Birth:  1970/05/10      BSA:          1.453 m Patient Age:    51 years       BP:           121/75 mmHg Patient Gender: F              HR:           71 bpm. Exam Location:  [facility] Procedure: 2D Echo, Cardiac Doppler and Color Doppler Indications:     Syncope R55  History:         Patient has no prior history  of Echocardiogram examinations.                  CAD. Tobacco abuse.  Sonographer:  Sherrie Sport Referring Phys:  [id number] [place] Diagnosing Phys: Ida Rogue MD IMPRESSIONS  1. Left ventricular ejection fraction, by estimation, is 60 to 65%. The left ventricle has normal function. The left ventricle has no regional wall motion abnormalities. Left ventricular diastolic parameters were normal.  2. Right ventricular systolic function is normal. The right ventricular size is normal. There is normal pulmonary artery systolic pressure. The estimated right ventricular systolic pressure is 61.4 mmHg.  3. The mitral valve is normal in structure. Trivial mitral valve regurgitation. No evidence of mitral stenosis.  4. The aortic valve is normal in structure. Aortic valve regurgitation is not visualized. No aortic stenosis is present.  5. The inferior vena cava is normal in size with greater than 50% respiratory variability, suggesting right atrial pressure of 3 mmHg. FINDINGS  Left Ventricle: Left ventricular ejection fraction, by estimation, is 60 to 65%. The left ventricle has normal function. The left ventricle has no regional wall motion abnormalities. The left ventricular internal cavity size was normal in size. There is  no left ventricular hypertrophy. Left ventricular diastolic parameters were normal. Right Ventricle: The right ventricular size is normal. No increase in right ventricular wall thickness. Right ventricular systolic function is normal. There is normal pulmonary artery systolic pressure. The tricuspid regurgitant velocity is 2.16 m/s, and  with an assumed right atrial pressure of 5 mmHg, the estimated right ventricular systolic pressure is 43.1 mmHg. Left Atrium: Left atrial size was normal in size. Right Atrium: Right atrial size was normal in size. Pericardium: There is no evidence of pericardial effusion. Mitral Valve: The mitral valve is normal in structure. Trivial mitral valve regurgitation.  No evidence of mitral valve stenosis. Tricuspid Valve: The tricuspid valve is normal in structure. Tricuspid valve regurgitation is not demonstrated. No evidence of tricuspid stenosis. Aortic Valve: The aortic valve is normal in structure. Aortic valve regurgitation is not visualized. No aortic stenosis is present. Aortic valve mean gradient measures 5.0 mmHg. Aortic valve peak gradient measures 8.5 mmHg. Aortic valve area, by VTI measures 2.66 cm. Pulmonic Valve: The pulmonic valve was normal in structure. Pulmonic valve regurgitation is not visualized. No evidence of pulmonic stenosis. Aorta: The aortic root is normal in size and structure. Venous: The inferior vena cava is normal in size with greater than 50% respiratory variability, suggesting right atrial pressure of 3 mmHg. IAS/Shunts: No atrial level shunt detected by color flow Doppler.  LEFT VENTRICLE PLAX 2D LVIDd:         4.10 cm   Diastology LVIDs:         2.30 cm   LV e' medial:    7.94 cm/s LV PW:         0.90 cm   LV E/e' medial:  13.2 LV IVS:        1.00 cm   LV e' lateral:   11.70 cm/s LVOT diam:     2.00 cm   LV E/e' lateral: 9.0 LV SV:         77 LV SV Index:   53 LVOT Area:     3.14 cm  RIGHT VENTRICLE RV Basal diam:  3.50 cm RV S prime:     14.10 cm/s TAPSE (M-mode): 2.5 cm LEFT ATRIUM             Index        RIGHT ATRIUM           Index LA diam:  2.80 cm 1.93 cm/m   RA Area:     13.40 cm LA Vol (A2C):   39.7 ml 27.33 ml/m  RA Volume:   32.40 ml  22.31 ml/m LA Vol (A4C):   25.1 ml 17.28 ml/m LA Biplane Vol: 31.7 ml 21.82 ml/m  AORTIC VALVE AV Area (Vmax):    2.39 cm AV Area (Vmean):   2.16 cm AV Area (VTI):     2.66 cm AV Vmax:           146.00 cm/s AV Vmean:          101.000 cm/s AV VTI:            0.289 m AV Peak Grad:      8.5 mmHg AV Mean Grad:      5.0 mmHg LVOT Vmax:         111.00 cm/s LVOT Vmean:        69.300 cm/s LVOT VTI:          0.245 m LVOT/AV VTI ratio: 0.85  AORTA Ao Root diam: 3.20 cm MITRAL VALVE                 TRICUSPID VALVE MV Area (PHT): 5.34 cm     TR Peak grad:   18.7 mmHg MV Decel Time: 142 msec     TR Vmax:        216.00 cm/s MV E velocity: 105.00 cm/s MV A velocity: 66.40 cm/s   SHUNTS MV E/A ratio:  1.58         Systemic VTI:  0.24 m                             Systemic Diam: 2.00 cm Ida Rogue MD Electronically signed by Ida Rogue MD Signature Date/Time: 10/31/2021/2:21:54 PM    Final    CT T-SPINE NO CHARGE  Result Date: 10/30/2021 CLINICAL DATA:  Syncope and fall EXAM: CT Thoracic and Lumbar spine with contrast TECHNIQUE: Multiplanar CT images of the thoracic and lumbar spine were reconstructed from contemporary CT of the Chest, Abdomen, and Pelvis. RADIATION DOSE REDUCTION: This exam was performed according to the departmental dose-optimization program which includes automated exposure control, adjustment of the mA and/or kV according to patient size and/or use of iterative reconstruction technique. CONTRAST:  100 cc Omnipaque 350 COMPARISON:  None Available. CTA chest/abdomen/pelvis 09/08/2018 FINDINGS: CT THORACIC SPINE FINDINGS Alignment: There is mild dextrocurvature centered in the midthoracic spine. There is no antero or retrolisthesis or other evidence of traumatic malalignment. Vertebrae: Vertebral body heights are preserved. There is no evidence of acute fracture. There is no suspicious osseous lesion. Paraspinal and other soft tissues: The paraspinal soft tissues are unremarkable. The heart and lungs are assessed on the separately dictated CTA chest. Disc levels: There is no significant osseous spinal canal or neural foraminal stenosis. CT LUMBAR SPINE FINDINGS Segmentation: Standard. Alignment: Normal. Vertebrae: Vertebral body heights are preserved. There is no evidence of acute fracture. There is no suspicious osseous lesion. Paraspinal and other soft tissues: The paraspinal soft tissues are unremarkable. The abdominal and pelvic viscera are assessed on the separately dictated CT  abdomen/pelvis. Disc levels: There is no significant spinal canal or neural foraminal stenosis. IMPRESSION: No acute fracture or traumatic malalignment of the thoracolumbar spine. Electronically Signed   By: Valetta Mole M.D.   On: 10/30/2021 11:51   CT L-SPINE NO CHARGE  Result Date: 10/30/2021 CLINICAL DATA:  Syncope and  fall EXAM: CT Thoracic and Lumbar spine with contrast TECHNIQUE: Multiplanar CT images of the thoracic and lumbar spine were reconstructed from contemporary CT of the Chest, Abdomen, and Pelvis. RADIATION DOSE REDUCTION: This exam was performed according to the departmental dose-optimization program which includes automated exposure control, adjustment of the mA and/or kV according to patient size and/or use of iterative reconstruction technique. CONTRAST:  100 cc Omnipaque 350 COMPARISON:  None Available. CTA chest/abdomen/pelvis 09/08/2018 FINDINGS: CT THORACIC SPINE FINDINGS Alignment: There is mild dextrocurvature centered in the midthoracic spine. There is no antero or retrolisthesis or other evidence of traumatic malalignment. Vertebrae: Vertebral body heights are preserved. There is no evidence of acute fracture. There is no suspicious osseous lesion. Paraspinal and other soft tissues: The paraspinal soft tissues are unremarkable. The heart and lungs are assessed on the separately dictated CTA chest. Disc levels: There is no significant osseous spinal canal or neural foraminal stenosis. CT LUMBAR SPINE FINDINGS Segmentation: Standard. Alignment: Normal. Vertebrae: Vertebral body heights are preserved. There is no evidence of acute fracture. There is no suspicious osseous lesion. Paraspinal and other soft tissues: The paraspinal soft tissues are unremarkable. The abdominal and pelvic viscera are assessed on the separately dictated CT abdomen/pelvis. Disc levels: There is no significant spinal canal or neural foraminal stenosis. IMPRESSION: No acute fracture or traumatic malalignment of  the thoracolumbar spine. Electronically Signed   By: Valetta Mole M.D.   On: 10/30/2021 11:51   CT Angio Chest/Abd/Pel for Dissection W and/or Wo Contrast  Result Date: 10/30/2021 CLINICAL DATA:  Acute aortic syndrome suspected. Bloody emesis. Syncopal episode. EXAM: CT ANGIOGRAPHY CHEST, ABDOMEN AND PELVIS TECHNIQUE: Non-contrast CT of the chest was initially obtained. Multidetector CT imaging through the chest, abdomen and pelvis was performed using the standard protocol during bolus administration of intravenous contrast. Multiplanar reconstructed images and MIPs were obtained and reviewed to evaluate the vascular anatomy. RADIATION DOSE REDUCTION: This exam was performed according to the departmental dose-optimization program which includes automated exposure control, adjustment of the mA and/or kV according to patient size and/or use of iterative reconstruction technique. CONTRAST:  168m OMNIPAQUE IOHEXOL 350 MG/ML SOLN COMPARISON:  CTA 09/07/2021 FINDINGS: CTA CHEST FINDINGS Cardiovascular: No aortic dissection or aneurysm. Ascending aorta is nonaneurysmal measuring 31 mm. Intimal calcification is at the origin of the LEFT subclavian artery. Otherwise great vessels are normal. Descending thoracic aorta demonstrates mild intimal calcification without aneurysm or dissection. No evidence acute pulmonary embolism. Mediastinum/Nodes: Normal esophagus and trachea.  No adenopathy. Lungs/Pleura: No pulmonary infarction. No pneumothorax. No airspace disease. Musculoskeletal: No acute osseous abnormality. Review of the MIP images confirms the above findings. CTA ABDOMEN AND PELVIS FINDINGS VASCULAR Aorta: Normal caliber aorta without aneurysm, dissection, vasculitis or significant stenosis. Celiac: Patent without evidence of aneurysm, dissection, vasculitis or significant stenosis. SMA: Patent without evidence of aneurysm, dissection, vasculitis or significant stenosis. Again demonstrated smooth narrowing along the  dorsal margin of the vessel with reconstitution. No change from prior. Renals: Both renal arteries are patent without evidence of aneurysm, dissection, vasculitis, fibromuscular dysplasia or significant stenosis. IMA: Patent without evidence of aneurysm, dissection, vasculitis or significant stenosis. Inflow: Calcific plaque with stenosis of the LEFT common femoral artery. Reconstitution. No change from prior. Veins: No obvious venous abnormality within the limitations of this arterial phase study. Review of the MIP images confirms the above findings. NON-VASCULAR Hepatobiliary: No focal hepatic lesion. No biliary duct dilatation. Common bile duct is normal. Pancreas: Pancreas is normal. No ductal dilatation. No pancreatic inflammation. Spleen:  Normal spleen Adrenals/urinary tract: Adrenal glands and kidneys are normal. The ureters and bladder normal. Stomach/Bowel: Stomach, small bowel, appendix, and cecum are normal. The colon and rectosigmoid colon are normal. Vascular/Lymphatic: Abdominal aorta is normal caliber. No periportal or retroperitoneal adenopathy. No pelvic adenopathy. Reproductive: Uterus and adnexa unremarkable. Other: No free fluid. Musculoskeletal: No aggressive osseous lesion. Review of the MIP images confirms the above findings. IMPRESSION: Chest Impression: 1. No evidence of aortic dissection or aneurysm. 2. 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 3. No evidence acute pulmonary embolism. 4. No acute pulmonary parenchymal findings. Abdomen / Pelvis Impression: 1. No acute findings the abdominal aorta or branches. Atherosclerotic calcification again noted. No change in proximal stenosis of the SMA and LEFT common iliac artery compared to CT a 09/07/2021. 2. No acute findings in the abdomen pelvis. Electronically Signed   By: Suzy Bouchard M.D.   On: 10/30/2021 11:39   CT HEAD WO CONTRAST (5MM)  Result Date: 10/30/2021 CLINICAL DATA:  Head trauma,  moderate-severe; Facial trauma, blunt; Neck trauma, dangerous injury mechanism (Age 16-64y) EXAM: CT HEAD WITHOUT CONTRAST CT MAXILLOFACIAL WITHOUT CONTRAST CT CERVICAL SPINE WITHOUT CONTRAST TECHNIQUE: Multidetector CT imaging of the head, cervical spine, and maxillofacial structures were performed using the standard protocol without intravenous contrast. Multiplanar CT image reconstructions of the cervical spine and maxillofacial structures were also generated. RADIATION DOSE REDUCTION: This exam was performed according to the departmental dose-optimization program which includes automated exposure control, adjustment of the mA and/or kV according to patient size and/or use of iterative reconstruction technique. COMPARISON:  None Available. FINDINGS: CT HEAD FINDINGS Brain: No evidence of acute infarction, hemorrhage, hydrocephalus, extra-axial collection or mass lesion/mass effect. Vascular: No hyperdense vessel. Skull: No acute fracture. Other: No mastoid effusions. CT MAXILLOFACIAL FINDINGS Osseous: No fracture or mandibular dislocation. No destructive process. Orbits: Negative. No traumatic or inflammatory finding. Sinuses: Minimal inferior maxillary sinus mucosal thickening. Otherwise, clear. Soft tissues: Negative. CT CERVICAL SPINE FINDINGS Alignment: Reversal of normal cervical lordosis. Skull base and vertebrae: Vertebral body heights are maintained. No acute fracture Soft tissues and spinal canal: No prevertebral fluid or swelling. No visible canal hematoma. Disc levels:  No significant focal bony degenerative change. Upper chest: Visualized lung apices are clear. IMPRESSION: 1. No evidence of acute intracranial abnormality or facial fracture. 2. No evidence of acute fracture or traumatic malalignment the cervical spine. Electronically Signed   By: Margaretha Sheffield M.D.   On: 10/30/2021 11:28   CT Cervical Spine Wo Contrast  Result Date: 10/30/2021 CLINICAL DATA:  Head trauma, moderate-severe;  Facial trauma, blunt; Neck trauma, dangerous injury mechanism (Age 52-64y) EXAM: CT HEAD WITHOUT CONTRAST CT MAXILLOFACIAL WITHOUT CONTRAST CT CERVICAL SPINE WITHOUT CONTRAST TECHNIQUE: Multidetector CT imaging of the head, cervical spine, and maxillofacial structures were performed using the standard protocol without intravenous contrast. Multiplanar CT image reconstructions of the cervical spine and maxillofacial structures were also generated. RADIATION DOSE REDUCTION: This exam was performed according to the departmental dose-optimization program which includes automated exposure control, adjustment of the mA and/or kV according to patient size and/or use of iterative reconstruction technique. COMPARISON:  None Available. FINDINGS: CT HEAD FINDINGS Brain: No evidence of acute infarction, hemorrhage, hydrocephalus, extra-axial collection or mass lesion/mass effect. Vascular: No hyperdense vessel. Skull: No acute fracture. Other: No mastoid effusions. CT MAXILLOFACIAL FINDINGS Osseous: No fracture or mandibular dislocation. No destructive process. Orbits: Negative. No traumatic or inflammatory finding. Sinuses: Minimal inferior maxillary sinus mucosal thickening. Otherwise, clear. Soft  tissues: Negative. CT CERVICAL SPINE FINDINGS Alignment: Reversal of normal cervical lordosis. Skull base and vertebrae: Vertebral body heights are maintained. No acute fracture Soft tissues and spinal canal: No prevertebral fluid or swelling. No visible canal hematoma. Disc levels:  No significant focal bony degenerative change. Upper chest: Visualized lung apices are clear. IMPRESSION: 1. No evidence of acute intracranial abnormality or facial fracture. 2. No evidence of acute fracture or traumatic malalignment the cervical spine. Electronically Signed   By: Margaretha Sheffield M.D.   On: 10/30/2021 11:28   CT Maxillofacial Wo Contrast  Result Date: 10/30/2021 CLINICAL DATA:  Head trauma, moderate-severe; Facial trauma, blunt;  Neck trauma, dangerous injury mechanism (Age 9-64y) EXAM: CT HEAD WITHOUT CONTRAST CT MAXILLOFACIAL WITHOUT CONTRAST CT CERVICAL SPINE WITHOUT CONTRAST TECHNIQUE: Multidetector CT imaging of the head, cervical spine, and maxillofacial structures were performed using the standard protocol without intravenous contrast. Multiplanar CT image reconstructions of the cervical spine and maxillofacial structures were also generated. RADIATION DOSE REDUCTION: This exam was performed according to the departmental dose-optimization program which includes automated exposure control, adjustment of the mA and/or kV according to patient size and/or use of iterative reconstruction technique. COMPARISON:  None Available. FINDINGS: CT HEAD FINDINGS Brain: No evidence of acute infarction, hemorrhage, hydrocephalus, extra-axial collection or mass lesion/mass effect. Vascular: No hyperdense vessel. Skull: No acute fracture. Other: No mastoid effusions. CT MAXILLOFACIAL FINDINGS Osseous: No fracture or mandibular dislocation. No destructive process. Orbits: Negative. No traumatic or inflammatory finding. Sinuses: Minimal inferior maxillary sinus mucosal thickening. Otherwise, clear. Soft tissues: Negative. CT CERVICAL SPINE FINDINGS Alignment: Reversal of normal cervical lordosis. Skull base and vertebrae: Vertebral body heights are maintained. No acute fracture Soft tissues and spinal canal: No prevertebral fluid or swelling. No visible canal hematoma. Disc levels:  No significant focal bony degenerative change. Upper chest: Visualized lung apices are clear. IMPRESSION: 1. No evidence of acute intracranial abnormality or facial fracture. 2. No evidence of acute fracture or traumatic malalignment the cervical spine. Electronically Signed   By: Margaretha Sheffield M.D.   On: 10/30/2021 11:28   DG Elbow Complete Left  Result Date: 10/30/2021 CLINICAL DATA:  Syncopal episode.  Fell.  Left elbow pain. EXAM: LEFT ELBOW - COMPLETE 3+ VIEW  COMPARISON:  None Available. FINDINGS: The joint spaces are maintained. No acute elbow fracture. No osteochondral lesion. No joint effusion. IMPRESSION: No acute bony findings. Electronically Signed   By: Marijo Sanes M.D.   On: 10/30/2021 10:37        Scheduled Meds:  (feeding supplement) PROSource Plus  30 mL Oral TID BM   atorvastatin  80 mg Oral QHS   feeding supplement  1 Container Oral TID BM   folic acid  1 mg Oral Daily   multivitamin with minerals  1 tablet Oral Daily   [START ON 11/02/2021] pantoprazole  40 mg Intravenous Q12H   thiamine  100 mg Oral Daily   Or   thiamine  100 mg Intravenous Daily   Continuous Infusions:  sodium chloride 20 mL/hr at 11/01/21 0748   pantoprazole 8 mg/hr (11/01/21 0500)     LOS: 1 day    Time spent: 36 mins     Wyvonnia Dusky, MD [facility] Pager [phone]  If 7PM-7AM, please contact night-coverage [email or web address] 11/01/2021, 8:11 AM

## 2021-11-01 NOTE — Anesthesia Preprocedure Evaluation (Signed)
Anesthesia Evaluation  Patient identified by MRN, date of birth, ID band Patient awake    Reviewed: Allergy & Precautions, H&P , NPO status , Patient's Chart, lab work & pertinent test results, reviewed documented beta blocker date and time   History of Anesthesia Complications Negative for: history of anesthetic complications  Airway Mallampati: II  TM Distance: >3 FB Neck ROM: full    Dental  (+) Dental Advidsory Given, Edentulous Upper, Upper Dentures, Poor Dentition   Pulmonary neg pulmonary ROS, former smoker,    Pulmonary exam normal breath sounds clear to auscultation       Cardiovascular Exercise Tolerance: Good (-) hypertension(-) angina+ CAD, + Past MI, + Cardiac Stents and + Peripheral Vascular Disease  (-) CABG Normal cardiovascular exam(-) dysrhythmias (-) Valvular Problems/Murmurs Rhythm:regular Rate:Normal     Neuro/Psych negative neurological ROS  negative psych ROS   GI/Hepatic negative GI ROS, Neg liver ROS,   Endo/Other  negative endocrine ROS  Renal/GU negative Renal ROS  negative genitourinary   Musculoskeletal   Abdominal   Peds  Hematology negative hematology ROS (+)   Anesthesia Other Findings Past Medical History: No date: Alcohol abuse 09/2021: Coronary artery disease     Comment:  s/p PCI to RCA in setting of unstable angina No date: History of tongue cancer No date: PAD (peripheral artery disease) (HCC) No date: Tobacco abuse   Reproductive/Obstetrics negative OB ROS                             Anesthesia Physical Anesthesia Plan  ASA: 3  Anesthesia Plan: General   Post-op Pain Management:    Induction: Intravenous  PONV Risk Score and Plan: Propofol infusion and TIVA  Airway Management Planned: Natural Airway and Nasal Cannula  Additional Equipment:   Intra-op Plan:   Post-operative Plan:   Informed Consent: I have reviewed the patients  History and Physical, chart, labs and discussed the procedure including the risks, benefits and alternatives for the proposed anesthesia with the patient or authorized representative who has indicated his/her understanding and acceptance.     Dental Advisory Given  Plan Discussed with: Anesthesiologist, CRNA and Surgeon  Anesthesia Plan Comments:         Anesthesia Quick Evaluation

## 2021-11-01 NOTE — Progress Notes (Signed)
Progress Note  Patient Name: Sonya Harmon Date of Encounter: 11/01/2021  Primary Cardiologist: Fletcher Anon  Subjective   HGB improved to 8.9 last evening following pRBC with multiple episodes of melena prior. No further melena or hematemesis. Hemodynamically stable. Feels a little stronger today. No chest pain, dyspnea, dizziness, presyncope, or syncope.   Inpatient Medications    Scheduled Meds:  (feeding supplement) PROSource Plus  30 mL Oral TID BM   atorvastatin  80 mg Oral QHS   feeding supplement  1 Container Oral TID BM   folic acid  1 mg Oral Daily   multivitamin with minerals  1 tablet Oral Daily   [START ON 11/02/2021] pantoprazole  40 mg Intravenous Q12H   thiamine  100 mg Oral Daily   Or   thiamine  100 mg Intravenous Daily   Continuous Infusions:  sodium chloride 20 mL/hr at 11/01/21 0500   pantoprazole 8 mg/hr (11/01/21 0500)   PRN Meds: acetaminophen **OR** acetaminophen, LORazepam **OR** LORazepam, midodrine, morphine injection, ondansetron **OR** ondansetron (ZOFRAN) IV, senna-docusate   Vital Signs    Vitals:   10/31/21 1641 10/31/21 2101 10/31/21 2345 11/01/21 0419  BP: 126/74 122/70 (!) 103/56 (!) 104/54  Pulse: 71 62 73 61  Resp: '16 18 18 16  '$ Temp: 98.5 F (36.9 C) 98.4 F (36.9 C) 98.4 F (36.9 C) 97.9 F (36.6 C)  TempSrc: Oral     SpO2: 99% 100% 99% 99%  Weight:      Height:        Intake/Output Summary (Last 24 hours) at 11/01/2021 0731 Last data filed at 11/01/2021 0600 Gross per 24 hour  Intake 2702.19 ml  Output 1951 ml  Net 751.19 ml    Filed Weights   10/30/21 0959 10/31/21 0234  Weight: 45 kg 47.5 kg    Telemetry    SR with rates in the 60s to 70s bpm - Personally Reviewed  ECG    No new tracings - Personally Reviewed  Physical Exam   GEN: No acute distress.   Neck: No JVD. Cardiac: RRR, no murmurs, rubs, or gallops.  Respiratory: Clear to auscultation bilaterally.  GI: Soft, nontender, non-distended.   MS: No  edema; No deformity. Neuro:  Alert and oriented x 3; Nonfocal.  Psych: Normal affect.  Labs    Chemistry Recent Labs  Lab 10/30/21 1001 10/31/21 0515  NA 129* 139  K 3.7 3.3*  CL 97* 111  CO2 19* 22  GLUCOSE 123* 88  BUN 32* 17  CREATININE 0.82 0.72  CALCIUM 8.1* 8.1*  PROT 6.2*  --   ALBUMIN 3.5  --   AST 34  --   ALT 18  --   ALKPHOS 53  --   BILITOT 0.6  --   GFRNONAA >60 >60  ANIONGAP 13 6      Hematology Recent Labs  Lab 10/30/21 1001 10/31/21 0515 10/31/21 2017  WBC 11.5* 5.8  --   RBC 2.69* 2.16*  --   HGB 8.6* 6.8* 8.9*  HCT 25.9* 20.4* 26.1*  MCV 96.3 94.4  --   MCH 32.0 31.5  --   MCHC 33.2 33.3  --   RDW 13.8 13.8  --   PLT 342 251  --      Cardiac EnzymesNo results for input(s): "TROPONINI" in the last 168 hours. No results for input(s): "TROPIPOC" in the last 168 hours.   BNPNo results for input(s): "BNP", "PROBNP" in the last 168 hours.   DDimer No  results for input(s): "DDIMER" in the last 168 hours.   Radiology    CT T-SPINE NO CHARGE  Result Date: 10/30/2021 IMPRESSION: No acute fracture or traumatic malalignment of the thoracolumbar spine. Electronically Signed   By: Valetta Mole M.D.   On: 10/30/2021 11:51   CT L-SPINE NO CHARGE  Result Date: 10/30/2021 IMPRESSION: No acute fracture or traumatic malalignment of the thoracolumbar spine. Electronically Signed   By: Valetta Mole M.D.   On: 10/30/2021 11:51   CT Angio Chest/Abd/Pel for Dissection W and/or Wo Contrast  Result Date: 10/30/2021 IMPRESSION: Chest Impression: 1. No evidence of aortic dissection or aneurysm. 2. 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 3. No evidence acute pulmonary embolism. 4. No acute pulmonary parenchymal findings. Abdomen / Pelvis Impression: 1. No acute findings the abdominal aorta or branches. Atherosclerotic calcification again noted. No change in proximal stenosis of the SMA and LEFT common iliac artery  compared to CT a 09/07/2021. 2. No acute findings in the abdomen pelvis. Electronically Signed   By: Suzy Bouchard M.D.   On: 10/30/2021 11:39   CT HEAD WO CONTRAST (5MM)  Result Date: 10/30/2021 IMPRESSION: 1. No evidence of acute intracranial abnormality or facial fracture. 2. No evidence of acute fracture or traumatic malalignment the cervical spine. Electronically Signed   By: Margaretha Sheffield M.D.   On: 10/30/2021 11:28   CT Cervical Spine Wo Contrast  Result Date: 10/30/2021 IMPRESSION: 1. No evidence of acute intracranial abnormality or facial fracture. 2. No evidence of acute fracture or traumatic malalignment the cervical spine. Electronically Signed   By: Margaretha Sheffield M.D.   On: 10/30/2021 11:28   CT Maxillofacial Wo Contrast  Result Date: 10/30/2021 IMPRESSION: 1. No evidence of acute intracranial abnormality or facial fracture. 2. No evidence of acute fracture or traumatic malalignment the cervical spine. Electronically Signed   By: Margaretha Sheffield M.D.   On: 10/30/2021 11:28   DG Elbow Complete Left  Result Date: 10/30/2021 IMPRESSION: No acute bony findings. Electronically Signed   By: Marijo Sanes M.D.   On: 10/30/2021 10:37    Cardiac Studies   2D echo 10/31/2021: 1. Left ventricular ejection fraction, by estimation, is 60 to 65%. The  left ventricle has normal function. The left ventricle has no regional  wall motion abnormalities. Left ventricular diastolic parameters were  normal.   2. Right ventricular systolic function is normal. The right ventricular  size is normal. There is normal pulmonary artery systolic pressure. The  estimated right ventricular systolic pressure is 16.0 mmHg.   3. The mitral valve is normal in structure. Trivial mitral valve  regurgitation. No evidence of mitral stenosis.   4. The aortic valve is normal in structure. Aortic valve regurgitation is  not visualized. No aortic stenosis is present.   5. The inferior vena cava is  normal in size with greater than 50%  respiratory variability, suggesting right atrial pressure of 3 mmHg.  Patient Profile     51 y.o. female with history of CAD status post PCI to the RCA in 09/2021, PAD status post stenting to the left external iliac artery with drug-coated balloon angioplasty to the distal left common iliac artery, tongue cancer, HLD, tobacco use, alcohol use  who is being seen today for the evaluation of DAPT in the context of hematemesis at the request of Dr. Tobie Poet.  Assessment & Plan    1. Syncope with hypovolemic hypotension:  -Likely in the context of  volume depletion, poor oral intake, and acute blood loss anemia  -Hemodynamically stable -Monitor on telemetry  -Echo pending  2. Hematemesis and melena with acute blood loss anemia:  -HGB trended to 6.8, on 7/28 with multiple episodes of melena  -Status post 1 unit pRBC with HGB 8.9 last evening -GI planning for EGD today -Continue to hold ASA/Plavix -IV PPI  -Avoid NSAIDs, has been taking ibuprofen daily for the past month -Possibly exacerbated by alcohol use  -Maintain hemoglobin greater than 8.0  -Transfuse per IM recommendations  -She may proceed with noncardiac procedure at an overall moderate risk without further testing -Appreciate GI assistance   3. CAD involving the native coronary arteries status post recent PCI to the RCA in 09/2021:  -No symptoms of angina or decompensation  -High sensitivity troponin negative x2, EKG without acute ischemic changes  -DAPT on hold given concern for acute GI bleed  -Interventional cardiology recommends resuming clopidogrel without aspirin in 1 to 2 days, or when safe to do so from a GI perspective  -Hold metoprolol for now with hypotension  -No plans for inpatient ischemic testing  4. PAD:  -Status post recent lower extremity stenting and drug-coated balloon angioplasty  -Hold antiplatelet therapy with GI bleed, plan is to resume clopidogrel without aspirin in 1 to  2 days, only when safe to do so   5. HLD:  -Target LDL less than 70  -PTA atorvastatin 80 mg when taking oral medications       For questions or updates, please contact [place] Please consult [email or web address] for contact info under Cardiology/STEMI.    Signed, Christell Faith, PA-C [place] Pager: [phone] 11/01/2021, 7:31 AM

## 2021-11-01 NOTE — Progress Notes (Signed)
Attempted x2 to give report to shift nurse spoke with another shift nurse Clarise Cruz, Therapist, sports. Notified nurse that pt alert, stable with no noted distress. Will be returning to unit. Pt spoke with MD and received post operative instructions with verbal understanding.

## 2021-11-01 NOTE — Op Note (Signed)
[facility] Gastroenterology Patient Name: Sonya Harmon Procedure Date: 11/01/2021 9:23 AM MRN: [id number] Account #: [id number] Date of Birth: December 03, 1970 Admit Type: Outpatient Age: 51 Room: [facility] ENDO ROOM 4 Gender: Female Note Status: Finalized Instrument Name: Upper Endoscope [id number] Procedure:             Upper GI endoscopy Indications:           Acute post hemorrhagic anemia, Coffee-ground emesis,                         Melena Providers:             Lin Landsman MD, MD Medicines:             General Anesthesia Complications:         No immediate complications. Estimated blood loss: None. Procedure:             Pre-Anesthesia Assessment:                        - Prior to the procedure, a History and Physical was                         performed, and patient medications and allergies were                         reviewed. The patient is competent. The risks and                         benefits of the procedure and the sedation options and                         risks were discussed with the patient. All questions                         were answered and informed consent was obtained.                         Patient identification and proposed procedure were                         verified by the physician, the nurse, the                         anesthesiologist, the anesthetist and the technician                         in the pre-procedure area in the procedure room in the                         endoscopy suite. Mental Status Examination: alert and                         oriented. Airway Examination: normal oropharyngeal                         airway and neck mobility. Respiratory Examination:                         clear to auscultation. CV  Examination: normal.                         Prophylactic Antibiotics: The patient does not require                         prophylactic antibiotics. Prior Anticoagulants: The                         patient  has taken Plavix (clopidogrel), last dose was                         2 days prior to procedure. ASA Grade Assessment: III -                         A patient with severe systemic disease. After                         reviewing the risks and benefits, the patient was                         deemed in satisfactory condition to undergo the                         procedure. The anesthesia plan was to use general                         anesthesia. Immediately prior to administration of                         medications, the patient was re-assessed for adequacy                         to receive sedatives. The heart rate, respiratory                         rate, oxygen saturations, blood pressure, adequacy of                         pulmonary ventilation, and response to care were                         monitored throughout the procedure. The physical                         status of the patient was re-assessed after the                         procedure.                        After obtaining informed consent, the endoscope was                         passed under direct vision. Throughout the procedure,                         the patient's blood pressure, pulse, and oxygen  saturations were monitored continuously. The Endoscope                         was introduced through the mouth, and advanced to the                         second part of duodenum. The upper GI endoscopy was                         accomplished without difficulty. The patient tolerated                         the procedure well. Findings:      Diffuse severe inflammation was found in the duodenal bulb.      The second portion of the duodenum was normal.      Four non-obstructing non-bleeding cratered and superficial gastric       ulcers with a clean ulcer base (Forrest Class III) were found in the       gastric antrum and in the prepyloric region of the stomach. The largest       lesion  was 10 mm in largest dimension. There is no evidence of       perforation. Biopsies were taken with a cold forceps for Helicobacter       pylori testing.      The cardia and gastric fundus were normal on retroflexion.      A small hiatal hernia was present.      The gastroesophageal junction and examined esophagus were normal. Impression:            - Alcoholic duodenitis.                        - Normal second portion of the duodenum.                        - Non-obstructing non-bleeding gastric ulcers with a                         clean ulcer base (Forrest Class III). NSAID induced                         etiology. There is no evidence of perforation.                         Biopsied.                        - Small hiatal hernia.                        - Normal gastroesophageal junction and esophagus. Recommendation:        - Return patient to hospital ward for possible                         discharge same day.                        - Cardiac diet today.                        -  Continue present medications.                        - Use Prilosec (omeprazole) 40 mg PO BID for 3 months.                        - Repeat upper endoscopy in 2 months to check healing.                        - Return to my office in 2 months.                        - Resume Plavix (clopidogrel) today at prior dose.                         Refer to managing physician for further adjustment of                         therapy.                        - No ibuprofen, naproxen, or other non-steroidal                         anti-inflammatory drugs. Procedure Code(s):     --- Professional ---                        [id number], Esophagogastroduodenoscopy, flexible,                         transoral; with biopsy, single or multiple Diagnosis Code(s):     --- Professional ---                        K29.80, Duodenitis without bleeding                        T39.395S, Adverse effect of other nonsteroidal                          anti-inflammatory drugs [NSAID], sequela                        K25.9, Gastric ulcer, unspecified as acute or chronic,                         without hemorrhage or perforation                        K44.9, Diaphragmatic hernia without obstruction or                         gangrene                        D62, Acute posthemorrhagic anemia                        K92.0, Hematemesis                        K92.1, Melena (includes Hematochezia) CPT copyright 2019 American  Medical Association. All rights reserved. The codes documented in this report are preliminary and upon coder review may  be revised to meet current compliance requirements. Dr. Ulyess Mort Lin Landsman MD, MD 11/01/2021 11:11:20 AM This report has been signed electronically. Number of Addenda: 0 Note Initiated On: 11/01/2021 9:23 AM Estimated Blood Loss:  Estimated blood loss: none.      [facility]

## 2021-11-01 NOTE — Transfer of Care (Signed)
Immediate Anesthesia Transfer of Care Note  Patient: Sonya Harmon  Procedure(s) Performed: ESOPHAGOGASTRODUODENOSCOPY (EGD) WITH PROPOFOL  Patient Location: PACU and Endoscopy Unit  Anesthesia Type:General  Level of Consciousness: drowsy  Airway & Oxygen Therapy: Patient connected to face mask oxygen  Post-op Assessment: Report given to RN  Post vital signs: stable  Last Vitals:  Vitals Value Taken Time  BP    Temp    Pulse    Resp    SpO2      Last Pain:  Vitals:   11/01/21 0956  TempSrc: Temporal  PainSc: 0-No pain         Complications: No notable events documented.

## 2021-11-01 NOTE — Anesthesia Postprocedure Evaluation (Signed)
Anesthesia Post Note  Patient: Sonya Harmon  Procedure(s) Performed: ESOPHAGOGASTRODUODENOSCOPY (EGD) WITH PROPOFOL  Patient location during evaluation: Endoscopy Anesthesia Type: General Level of consciousness: awake and alert Pain management: pain level controlled Vital Signs Assessment: post-procedure vital signs reviewed and stable Respiratory status: spontaneous breathing, nonlabored ventilation, respiratory function stable and patient connected to nasal cannula oxygen Cardiovascular status: blood pressure returned to baseline and stable Postop Assessment: no apparent nausea or vomiting Anesthetic complications: no   No notable events documented.   Last Vitals:  Vitals:   11/01/21 1207 11/01/21 2018  BP: 117/83 (!) 142/76  Pulse: 90 87  Resp: 17 16  Temp:  37.2 C  SpO2: 100% 100%    Last Pain:  Vitals:   11/01/21 2018  TempSrc: Oral  PainSc:                  Martha Clan

## 2021-11-02 DIAGNOSIS — R55 Syncope and collapse: Secondary | ICD-10-CM | POA: Diagnosis not present

## 2021-11-02 DIAGNOSIS — K922 Gastrointestinal hemorrhage, unspecified: Secondary | ICD-10-CM | POA: Diagnosis not present

## 2021-11-02 DIAGNOSIS — K920 Hematemesis: Secondary | ICD-10-CM | POA: Diagnosis not present

## 2021-11-02 DIAGNOSIS — I739 Peripheral vascular disease, unspecified: Secondary | ICD-10-CM | POA: Diagnosis not present

## 2021-11-02 DIAGNOSIS — E876 Hypokalemia: Secondary | ICD-10-CM | POA: Diagnosis not present

## 2021-11-02 DIAGNOSIS — F1010 Alcohol abuse, uncomplicated: Secondary | ICD-10-CM | POA: Diagnosis not present

## 2021-11-02 DIAGNOSIS — I25118 Atherosclerotic heart disease of native coronary artery with other forms of angina pectoris: Secondary | ICD-10-CM

## 2021-11-02 LAB — BASIC METABOLIC PANEL
Anion gap: 4 — ABNORMAL LOW (ref 5–15)
BUN: 6 mg/dL (ref 6–20)
CO2: 23 mmol/L (ref 22–32)
Calcium: 8.3 mg/dL — ABNORMAL LOW (ref 8.9–10.3)
Chloride: 106 mmol/L (ref 98–111)
Creatinine, Ser: 0.62 mg/dL (ref 0.44–1.00)
GFR, Estimated: >60 mL/min (ref >60)
Glucose, Bld: 95 mg/dL (ref 70–99)
Potassium: 3.4 mmol/L — ABNORMAL LOW (ref 3.5–5.1)
Sodium: 133 mmol/L — ABNORMAL LOW (ref 135–145)

## 2021-11-02 LAB — CBC
HCT: 25.3 % — ABNORMAL LOW (ref 36.0–46.0)
Hemoglobin: 8.4 g/dL — ABNORMAL LOW (ref 12.0–15.0)
MCH: 31.5 pg (ref 26.0–34.0)
MCHC: 33.2 g/dL (ref 30.0–36.0)
MCV: 94.8 fL (ref 80.0–100.0)
Platelets: 211 10*3/uL (ref 150–400)
RBC: 2.67 MIL/uL — ABNORMAL LOW (ref 3.87–5.11)
RDW: 14.3 % (ref 11.5–15.5)
WBC: 3.3 10*3/uL — ABNORMAL LOW (ref 4.0–10.5)
nRBC: 0 % (ref 0.0–0.2)

## 2021-11-02 MED ORDER — CYANOCOBALAMIN 1000 MCG PO TABS: 1000.0000 ug | ORAL_TABLET | Freq: Every day | ORAL | 0 refills | Status: AC

## 2021-11-02 MED ORDER — FERROUS SULFATE 325 (65 FE) MG PO TABS: 325.0000 mg | ORAL_TABLET | Freq: Every day | ORAL | 0 refills | Status: DC

## 2021-11-02 MED ORDER — OMEPRAZOLE 40 MG PO CPDR: 40.0000 mg | DELAYED_RELEASE_CAPSULE | Freq: Two times a day (BID) | ORAL | 2 refills | Status: DC

## 2021-11-02 MED ORDER — POTASSIUM CHLORIDE CRYS ER 20 MEQ PO TBCR
40.0000 meq | EXTENDED_RELEASE_TABLET | Freq: Once | ORAL | Status: AC
Start: 2021-11-02 — End: 2021-11-02
  Administered 2021-11-02: 40 meq via ORAL
  Filled 2021-11-02: qty 2

## 2021-11-02 NOTE — Progress Notes (Signed)
Progress Note  Patient Name: Sonya Harmon Date of Encounter: 11/02/2021  [facility] Cardiologist: Fletcher Anon  Subjective   Feels well this morning, hemoglobin stable Restarted on Plavix yesterday with no aspirin Completed EGD yesterday showing diffuse severe inflammation duodenal bulb, gastric ulcers, small hiatal hernia, diagnosed with alcoholic duodenitis and nonobstructing nonbleeding gastric ulcers with clean base NSAIDs felt to be part of the etiology   Inpatient Medications    Scheduled Meds:  (feeding supplement) PROSource Plus  30 mL Oral TID BM   atorvastatin  80 mg Oral QHS   clopidogrel  75 mg Oral Daily   vitamin B-12  1,000 mcg Oral Daily   feeding supplement  1 Container Oral TID BM   folic acid  1 mg Oral Daily   multivitamin with minerals  1 tablet Oral Daily   pantoprazole  40 mg Intravenous Q12H   thiamine  100 mg Oral Daily   Or   thiamine  100 mg Intravenous Daily   Continuous Infusions:  PRN Meds: acetaminophen **OR** acetaminophen, midodrine, morphine injection, ondansetron **OR** ondansetron (ZOFRAN) IV, senna-docusate   Vital Signs    Vitals:   11/01/21 2356 11/02/21 0400 11/02/21 0720 11/02/21 1126  BP: 117/67 100/65 112/71 115/66  Pulse: 84 72 72 63  Resp: '15 16 16 18  '$ Temp: 99.7 F (37.6 C) 98.5 F (36.9 C) 98.5 F (36.9 C) 98.1 F (36.7 C)  TempSrc:      SpO2: 100% 100% 100% 100%  Weight:      Height:        Intake/Output Summary (Last 24 hours) at 11/02/2021 1524 Last data filed at 11/02/2021 0900 Gross per 24 hour  Intake 507.06 ml  Output 1300 ml  Net -792.94 ml      11/01/2021    9:56 AM 10/31/2021    2:34 AM 10/30/2021    9:59 AM  Last 3 Weights  Weight (lbs) 102 lb 104 lb 12.8 oz 99 lb 3.3 oz  Weight (kg) 46.267 kg 47.537 kg 45 kg      Telemetry    Normal sinus rhythm- Personally Reviewed  ECG     - Personally Reviewed  Physical Exam   GEN: No acute distress.   Neck: No JVD Cardiac: RRR, no murmurs,  rubs, or gallops.  Respiratory: Clear to auscultation bilaterally. GI: Soft, nontender, non-distended  MS: No edema; No deformity. Neuro:  Nonfocal  Psych: Normal affect   Labs    High Sensitivity Troponin:   Recent Labs  Lab 10/10/21 1620 10/10/21 1957 10/30/21 1001 10/30/21 1159  TROPONINIHS '6 7 10 11     '$ Chemistry Recent Labs  Lab 10/30/21 1001 10/31/21 0515 11/01/21 0719 11/02/21 0645  NA 129* 139 137 133*  K 3.7 3.3* 3.2* 3.4*  CL 97* 111 111 106  CO2 19* '22 23 23  '$ GLUCOSE 123* 88 94 95  BUN 32* '17 8 6  '$ CREATININE 0.82 0.72 0.54 0.62  CALCIUM 8.1* 8.1* 8.3* 8.3*  MG 1.9  --   --   --   PROT 6.2*  --   --   --   ALBUMIN 3.5  --   --   --   AST 34  --   --   --   ALT 18  --   --   --   ALKPHOS 53  --   --   --   BILITOT 0.6  --   --   --   GFRNONAA >60 >60 >60 >  60  ANIONGAP 13 6 3* 4*    Lipids No results for input(s): "CHOL", "TRIG", "HDL", "LABVLDL", "LDLCALC", "CHOLHDL" in the last 168 hours.  Hematology Recent Labs  Lab 10/31/21 0515 10/31/21 2017 11/01/21 0719 11/02/21 0645  WBC 5.8  --  5.5 3.3*  RBC 2.16*  --  2.80* 2.67*  HGB 6.8* 8.9* 8.8* 8.4*  HCT 20.4* 26.1* 26.8* 25.3*  MCV 94.4  --  95.7 94.8  MCH 31.5  --  31.4 31.5  MCHC 33.3  --  32.8 33.2  RDW 13.8  --  14.8 14.3  PLT 251  --  216 211   Thyroid No results for input(s): "TSH", "FREET4" in the last 168 hours.  BNPNo results for input(s): "BNP", "PROBNP" in the last 168 hours.  DDimer No results for input(s): "DDIMER" in the last 168 hours.   Radiology    No results found.  Cardiac Studies     Patient Profile     51 y.o. female with history of CAD status post PCI to the RCA in 09/2021, PAD status post stenting to the left external iliac artery with drug-coated balloon angioplasty to the distal left common iliac artery, tongue cancer, HLD, tobacco use, alcohol use  who is being seen today for the evaluation of DAPT in the context of hematemesis at the request of Dr.  Tobie Poet.  Assessment & Plan    Upper GI bleed/hematemesis with anemia History of alcohol, reported taking ibuprofen daily for the past month for headaches On aspirin and Plavix following recent stent placement to right coronary artery June 2023 also recent angioplasty with stent placement left iliac 3 weeks ago -3 episodes melena past after admission EGD completed with details as above, nonbleeding gastric ulcers = Bleeding appears to have stopped on IV PPI transition to oral Prilosec I requested GI -Restarted on Plavix 75 daily, aspirin held per interventional team   Coronary artery disease with stable angina Recent stent placement to RCA in June 2023 Back on Plavix Aspirin held Statin, restart metoprolol   PAD  angioplasty/stenting left iliac 3 weeks ago Hemoglobin stable, restart Plavix   Hyperlipidemia Continue Lipitor 80 daily   Total encounter time more than 40 minutes  Greater than 50% was spent in counseling and coordination of care with the patient     For questions or updates, please contact [place] Please consult [email or web address] for contact info under        Signed, Ida Rogue, MD  11/02/2021, 3:24 PM

## 2021-11-02 NOTE — Discharge Summary (Addendum)
Physician Discharge Summary  Sonya Harmon:[id number] DOB: Nov 06, 1970 DOA: 10/30/2021  PCP: Leonides Sake, MD  Admit date: 10/30/2021 Discharge date: 11/02/2021  Admitted From: home  Disposition:  home   Recommendations for Outpatient Follow-up:  Follow up with PCP in 1-2 weeks F/u w/ cardio, Dr. Fletcher Anon, in 1-2 weeks F/u w/ GI, Dr. Marius Ditch, in 2 months   Home Health: no  Equipment/Devices:  Discharge Condition: stable  CODE STATUS: full  Diet recommendation: Heart Healthy\   Brief/Interim Summary: HPI was taken from Dr. Tobie Poet: [name] is a 51 year old female with history of alcohol abuse, tobacco dependence, CAD status post PCI in 09/10/2021, who presents emergency department for chief concerns of syncope.  Initial vitals in the emergency department showed temperature of 97.9, respiration rate of 19, heart rate of 99, blood pressure 103/69, SPO2 of 100% on room air.  Serum sodium is 129, potassium 3.7, chloride 97, bicarb 19, BUN of 32, serum creatinine of 0.82, GFR greater than 60, nonfasting blood glucose 123, WBC 11.5, hemoglobin 8.6, platelets of 342.  High sensitive troponin was 10.  Lactic acid was 4.2.  ED treatment: Protonix bolus and gtt., LR 1 L bolus.   At is able to tell me her name, age, current locatio, current year. She vomited three times prior to my evaluation at bedside. She reports that after having a bowel movmenet this a.m., she passed out and hit her left jaw.  She woke up with her granddaughter standing over her.  She also endorses dark coffee-ground emesis at home prior to her arrival at cardiology clinic.  At the cardiology clinic, she had 1 episode of coffee ground/chocolate colored hematemesis while being evaluated by Dr. Fletcher Anon.  She endorses that she had 1 episode of coffee-ground emesis in the emergency department. She denies fever, new cough, dysuria, hematuria, diarrhea.   She endorses shortness of breath, worse with exertion.  She endorses  epigastric and left upper quadrant abdominal discomfort.  She states her last bowel  movement was dark, nearly black on AM pror to coming to Dr. Tyrell Antonio office.    Social history: She works at [employer]. She is a former tobacco, quitting in June 2023. At her peak, she smoked 1 ppd and started tobacco use at age 78. She drinks 3-4 beers per day. Last drink was 7/26/223.    As per Dr. Jimmye Norman 7/28-7/30/23: Pt was found to have upper GI bleed secondary to alcoholic duodenitis & non-obstructing non-bleeding gastric ulcers w/ a clear base as found on EGD. It was biopsied and pathology was pending at the time of d/c. Pt did receive 1 unit of pRBCs while inpatient. Also, pt received IV PPI while inpatient and was d/c home on omeprazole '40mg'$  BID as per GI recs. Of note, pt was restarted back on plavix only and not aspirin as per cardio recs. Pt did receive alcohol cessation counseling while inpatient.     Discharge Diagnoses:  Principal Problem:   Upper GI bleed Active Problems:   Tobacco dependence   Alcohol abuse   Abdominal pain   Hepatic steatosis   PAD (peripheral artery disease) (HCC)   Hyponatremia   Traumatic ecchymosis of face   CAD S/P percutaneous coronary angioplasty  Upper GI bleed: likely secondary to alcohol abuse. Continue on PPI. S/p 1 unit of pRBCs transfused. H&H are trending up. S/p EGD which showed alcoholic duodenitis, non-obstructing non-bleeding gastric ulcers w/ a clear ulcer base, biopsied & small hiatal hernia.  Alcohol abuse: continue on CIWA protocol. Received alcohol cessation counseling   Hypokalemia: potassium given   B12 deficiency: continue on B12 supplements    Tobacco dependence: quit smoking in June 2023   Hx of CAD: s/p percutaneous coronary angioplasty. Restart home dose of plavix and hold aspirin as per cardio. Hold metoprolol & can likely restart tomorrow    Traumatic ecchymosis of face: present on admission. Of left jaw and left side of her face,  secondary to traumatic syncope at home   Hyponatremia: labile     Hepatic steatosis: likely secondary to alcohol abuse   Discharge Instructions  Discharge Instructions     Diet - low sodium heart healthy   Complete by: As directed    Discharge instructions   Complete by: As directed    F/u w/ PCP in 1-2 weeks. F/u w/ cardio, Dr. Fletcher Anon, in 1-2 weeks. F/u w/ GI, Dr. Marius Ditch, in 2 months   Increase activity slowly   Complete by: As directed       Allergies as of 11/02/2021       Reactions   Amoxicil &clarithro &lansopraz Nausea And Vomiting   Amoxicillin Rash        Medication List     STOP taking these medications    aspirin 81 MG chewable tablet       TAKE these medications    atorvastatin 80 MG tablet Commonly known as: LIPITOR Take 1 tablet (80 mg total) by mouth daily.   clopidogrel 75 MG tablet Commonly known as: PLAVIX Take 1 tablet (75 mg total) by mouth daily.   cyanocobalamin 1000 MCG tablet Take 1 tablet (1,000 mcg total) by mouth daily. Start taking on: November 03, 2021   ferrous sulfate 325 (65 FE) MG tablet Take 1 tablet (325 mg total) by mouth daily.   metoprolol tartrate 25 MG tablet Commonly known as: LOPRESSOR Take 0.5 tablets (12.5 mg total) by mouth 2 (two) times daily.   omeprazole 40 MG capsule Commonly known as: PRILOSEC Take 1 capsule (40 mg total) by mouth 2 (two) times daily before a meal.        Allergies  Allergen Reactions   Amoxicil &Clarithro &Lansopraz Nausea And Vomiting   Amoxicillin Rash    Consultations: Cardio GI    Procedures/Studies: ECHOCARDIOGRAM COMPLETE  Result Date: 10/31/2021    ECHOCARDIOGRAM REPORT   Patient Name:   Sonya Harmon Date of Exam: 10/31/2021 Medical Rec #:  [id number]      Height:       62.0 in Accession #:    [id number]     Weight:       104.8 lb Date of Birth:  01/21/1971      BSA:          1.453 m Patient Age:    89 years       BP:           121/75 mmHg Patient Gender: F               HR:           71 bpm. Exam Location:  [facility] Procedure: 2D Echo, Cardiac Doppler and Color Doppler Indications:     Syncope R55  History:         Patient has no prior history of Echocardiogram examinations.                  CAD. Tobacco abuse.  Sonographer:     Sherrie Sport Referring Phys:  [id number] Rise Mu Diagnosing Phys: Ida Rogue MD IMPRESSIONS  1. Left ventricular ejection fraction, by estimation, is 60 to 65%. The left ventricle has normal function. The left ventricle has no regional wall motion abnormalities. Left ventricular diastolic parameters were normal.  2. Right ventricular systolic function is normal. The right ventricular size is normal. There is normal pulmonary artery systolic pressure. The estimated right ventricular systolic pressure is 84.1 mmHg.  3. The mitral valve is normal in structure. Trivial mitral valve regurgitation. No evidence of mitral stenosis.  4. The aortic valve is normal in structure. Aortic valve regurgitation is not visualized. No aortic stenosis is present.  5. The inferior vena cava is normal in size with greater than 50% respiratory variability, suggesting right atrial pressure of 3 mmHg. FINDINGS  Left Ventricle: Left ventricular ejection fraction, by estimation, is 60 to 65%. The left ventricle has normal function. The left ventricle has no regional wall motion abnormalities. The left ventricular internal cavity size was normal in size. There is  no left ventricular hypertrophy. Left ventricular diastolic parameters were normal. Right Ventricle: The right ventricular size is normal. No increase in right ventricular wall thickness. Right ventricular systolic function is normal. There is normal pulmonary artery systolic pressure. The tricuspid regurgitant velocity is 2.16 m/s, and  with an assumed right atrial pressure of 5 mmHg, the estimated right ventricular systolic pressure is 66.0 mmHg. Left Atrium: Left atrial size was normal in size. Right Atrium: Right atrial  size was normal in size. Pericardium: There is no evidence of pericardial effusion. Mitral Valve: The mitral valve is normal in structure. Trivial mitral valve regurgitation. No evidence of mitral valve stenosis. Tricuspid Valve: The tricuspid valve is normal in structure. Tricuspid valve regurgitation is not demonstrated. No evidence of tricuspid stenosis. Aortic Valve: The aortic valve is normal in structure. Aortic valve regurgitation is not visualized. No aortic stenosis is present. Aortic valve mean gradient measures 5.0 mmHg. Aortic valve peak gradient measures 8.5 mmHg. Aortic valve area, by VTI measures 2.66 cm. Pulmonic Valve: The pulmonic valve was normal in structure. Pulmonic valve regurgitation is not visualized. No evidence of pulmonic stenosis. Aorta: The aortic root is normal in size and structure. Venous: The inferior vena cava is normal in size with greater than 50% respiratory variability, suggesting right atrial pressure of 3 mmHg. IAS/Shunts: No atrial level shunt detected by color flow Doppler.  LEFT VENTRICLE PLAX 2D LVIDd:         4.10 cm   Diastology LVIDs:         2.30 cm   LV e' medial:    7.94 cm/s LV PW:         0.90 cm   LV E/e' medial:  13.2 LV IVS:        1.00 cm   LV e' lateral:   11.70 cm/s LVOT diam:     2.00 cm   LV E/e' lateral: 9.0 LV SV:         77 LV SV Index:   53 LVOT Area:     3.14 cm  RIGHT VENTRICLE RV Basal diam:  3.50 cm RV S prime:     14.10 cm/s TAPSE (M-mode): 2.5 cm LEFT ATRIUM             Index        RIGHT ATRIUM           Index LA diam:        2.80 cm 1.93 cm/m  RA Area:     13.40 cm LA Vol (A2C):   39.7 ml 27.33 ml/m  RA Volume:   32.40 ml  22.31 ml/m LA Vol (A4C):   25.1 ml 17.28 ml/m LA Biplane Vol: 31.7 ml 21.82 ml/m  AORTIC VALVE AV Area (Vmax):    2.39 cm AV Area (Vmean):   2.16 cm AV Area (VTI):     2.66 cm AV Vmax:           146.00 cm/s AV Vmean:          101.000 cm/s AV VTI:            0.289 m AV Peak Grad:      8.5 mmHg AV Mean Grad:       5.0 mmHg LVOT Vmax:         111.00 cm/s LVOT Vmean:        69.300 cm/s LVOT VTI:          0.245 m LVOT/AV VTI ratio: 0.85  AORTA Ao Root diam: 3.20 cm MITRAL VALVE                TRICUSPID VALVE MV Area (PHT): 5.34 cm     TR Peak grad:   18.7 mmHg MV Decel Time: 142 msec     TR Vmax:        216.00 cm/s MV E velocity: 105.00 cm/s MV A velocity: 66.40 cm/s   SHUNTS MV E/A ratio:  1.58         Systemic VTI:  0.24 m                             Systemic Diam: 2.00 cm Ida Rogue MD Electronically signed by Ida Rogue MD Signature Date/Time: 10/31/2021/2:21:54 PM    Final    CT T-SPINE NO CHARGE  Result Date: 10/30/2021 CLINICAL DATA:  Syncope and fall EXAM: CT Thoracic and Lumbar spine with contrast TECHNIQUE: Multiplanar CT images of the thoracic and lumbar spine were reconstructed from contemporary CT of the Chest, Abdomen, and Pelvis. RADIATION DOSE REDUCTION: This exam was performed according to the departmental dose-optimization program which includes automated exposure control, adjustment of the mA and/or kV according to patient size and/or use of iterative reconstruction technique. CONTRAST:  100 cc Omnipaque 350 COMPARISON:  None Available. CTA chest/abdomen/pelvis 09/08/2018 FINDINGS: CT THORACIC SPINE FINDINGS Alignment: There is mild dextrocurvature centered in the midthoracic spine. There is no antero or retrolisthesis or other evidence of traumatic malalignment. Vertebrae: Vertebral body heights are preserved. There is no evidence of acute fracture. There is no suspicious osseous lesion. Paraspinal and other soft tissues: The paraspinal soft tissues are unremarkable. The heart and lungs are assessed on the separately dictated CTA chest. Disc levels: There is no significant osseous spinal canal or neural foraminal stenosis. CT LUMBAR SPINE FINDINGS Segmentation: Standard. Alignment: Normal. Vertebrae: Vertebral body heights are preserved. There is no evidence of acute fracture. There is no  suspicious osseous lesion. Paraspinal and other soft tissues: The paraspinal soft tissues are unremarkable. The abdominal and pelvic viscera are assessed on the separately dictated CT abdomen/pelvis. Disc levels: There is no significant spinal canal or neural foraminal stenosis. IMPRESSION: No acute fracture or traumatic malalignment of the thoracolumbar spine. Electronically Signed   By: Valetta Mole M.D.   On: 10/30/2021 11:51   CT L-SPINE NO CHARGE  Result Date: 10/30/2021 CLINICAL DATA:  Syncope and fall EXAM: CT Thoracic and Lumbar  spine with contrast TECHNIQUE: Multiplanar CT images of the thoracic and lumbar spine were reconstructed from contemporary CT of the Chest, Abdomen, and Pelvis. RADIATION DOSE REDUCTION: This exam was performed according to the departmental dose-optimization program which includes automated exposure control, adjustment of the mA and/or kV according to patient size and/or use of iterative reconstruction technique. CONTRAST:  100 cc Omnipaque 350 COMPARISON:  None Available. CTA chest/abdomen/pelvis 09/08/2018 FINDINGS: CT THORACIC SPINE FINDINGS Alignment: There is mild dextrocurvature centered in the midthoracic spine. There is no antero or retrolisthesis or other evidence of traumatic malalignment. Vertebrae: Vertebral body heights are preserved. There is no evidence of acute fracture. There is no suspicious osseous lesion. Paraspinal and other soft tissues: The paraspinal soft tissues are unremarkable. The heart and lungs are assessed on the separately dictated CTA chest. Disc levels: There is no significant osseous spinal canal or neural foraminal stenosis. CT LUMBAR SPINE FINDINGS Segmentation: Standard. Alignment: Normal. Vertebrae: Vertebral body heights are preserved. There is no evidence of acute fracture. There is no suspicious osseous lesion. Paraspinal and other soft tissues: The paraspinal soft tissues are unremarkable. The abdominal and pelvic viscera are assessed on  the separately dictated CT abdomen/pelvis. Disc levels: There is no significant spinal canal or neural foraminal stenosis. IMPRESSION: No acute fracture or traumatic malalignment of the thoracolumbar spine. Electronically Signed   By: Valetta Mole M.D.   On: 10/30/2021 11:51   CT Angio Chest/Abd/Pel for Dissection W and/or Wo Contrast  Result Date: 10/30/2021 CLINICAL DATA:  Acute aortic syndrome suspected. Bloody emesis. Syncopal episode. EXAM: CT ANGIOGRAPHY CHEST, ABDOMEN AND PELVIS TECHNIQUE: Non-contrast CT of the chest was initially obtained. Multidetector CT imaging through the chest, abdomen and pelvis was performed using the standard protocol during bolus administration of intravenous contrast. Multiplanar reconstructed images and MIPs were obtained and reviewed to evaluate the vascular anatomy. RADIATION DOSE REDUCTION: This exam was performed according to the departmental dose-optimization program which includes automated exposure control, adjustment of the mA and/or kV according to patient size and/or use of iterative reconstruction technique. CONTRAST:  190m OMNIPAQUE IOHEXOL 350 MG/ML SOLN COMPARISON:  CTA 09/07/2021 FINDINGS: CTA CHEST FINDINGS Cardiovascular: No aortic dissection or aneurysm. Ascending aorta is nonaneurysmal measuring 31 mm. Intimal calcification is at the origin of the LEFT subclavian artery. Otherwise great vessels are normal. Descending thoracic aorta demonstrates mild intimal calcification without aneurysm or dissection. No evidence acute pulmonary embolism. Mediastinum/Nodes: Normal esophagus and trachea.  No adenopathy. Lungs/Pleura: No pulmonary infarction. No pneumothorax. No airspace disease. Musculoskeletal: No acute osseous abnormality. Review of the MIP images confirms the above findings. CTA ABDOMEN AND PELVIS FINDINGS VASCULAR Aorta: Normal caliber aorta without aneurysm, dissection, vasculitis or significant stenosis. Celiac: Patent without evidence of aneurysm,  dissection, vasculitis or significant stenosis. SMA: Patent without evidence of aneurysm, dissection, vasculitis or significant stenosis. Again demonstrated smooth narrowing along the dorsal margin of the vessel with reconstitution. No change from prior. Renals: Both renal arteries are patent without evidence of aneurysm, dissection, vasculitis, fibromuscular dysplasia or significant stenosis. IMA: Patent without evidence of aneurysm, dissection, vasculitis or significant stenosis. Inflow: Calcific plaque with stenosis of the LEFT common femoral artery. Reconstitution. No change from prior. Veins: No obvious venous abnormality within the limitations of this arterial phase study. Review of the MIP images confirms the above findings. NON-VASCULAR Hepatobiliary: No focal hepatic lesion. No biliary duct dilatation. Common bile duct is normal. Pancreas: Pancreas is normal. No ductal dilatation. No pancreatic inflammation. Spleen: Normal spleen Adrenals/urinary tract: Adrenal glands  and kidneys are normal. The ureters and bladder normal. Stomach/Bowel: Stomach, small bowel, appendix, and cecum are normal. The colon and rectosigmoid colon are normal. Vascular/Lymphatic: Abdominal aorta is normal caliber. No periportal or retroperitoneal adenopathy. No pelvic adenopathy. Reproductive: Uterus and adnexa unremarkable. Other: No free fluid. Musculoskeletal: No aggressive osseous lesion. Review of the MIP images confirms the above findings. IMPRESSION: Chest Impression: 1. No evidence of aortic dissection or aneurysm. 2. 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 3. No evidence acute pulmonary embolism. 4. No acute pulmonary parenchymal findings. Abdomen / Pelvis Impression: 1. No acute findings the abdominal aorta or branches. Atherosclerotic calcification again noted. No change in proximal stenosis of the SMA and LEFT common iliac artery compared to CT a 09/07/2021. 2. No acute  findings in the abdomen pelvis. Electronically Signed   By: Suzy Bouchard M.D.   On: 10/30/2021 11:39   CT HEAD WO CONTRAST (5MM)  Result Date: 10/30/2021 CLINICAL DATA:  Head trauma, moderate-severe; Facial trauma, blunt; Neck trauma, dangerous injury mechanism (Age 43-64y) EXAM: CT HEAD WITHOUT CONTRAST CT MAXILLOFACIAL WITHOUT CONTRAST CT CERVICAL SPINE WITHOUT CONTRAST TECHNIQUE: Multidetector CT imaging of the head, cervical spine, and maxillofacial structures were performed using the standard protocol without intravenous contrast. Multiplanar CT image reconstructions of the cervical spine and maxillofacial structures were also generated. RADIATION DOSE REDUCTION: This exam was performed according to the departmental dose-optimization program which includes automated exposure control, adjustment of the mA and/or kV according to patient size and/or use of iterative reconstruction technique. COMPARISON:  None Available. FINDINGS: CT HEAD FINDINGS Brain: No evidence of acute infarction, hemorrhage, hydrocephalus, extra-axial collection or mass lesion/mass effect. Vascular: No hyperdense vessel. Skull: No acute fracture. Other: No mastoid effusions. CT MAXILLOFACIAL FINDINGS Osseous: No fracture or mandibular dislocation. No destructive process. Orbits: Negative. No traumatic or inflammatory finding. Sinuses: Minimal inferior maxillary sinus mucosal thickening. Otherwise, clear. Soft tissues: Negative. CT CERVICAL SPINE FINDINGS Alignment: Reversal of normal cervical lordosis. Skull base and vertebrae: Vertebral body heights are maintained. No acute fracture Soft tissues and spinal canal: No prevertebral fluid or swelling. No visible canal hematoma. Disc levels:  No significant focal bony degenerative change. Upper chest: Visualized lung apices are clear. IMPRESSION: 1. No evidence of acute intracranial abnormality or facial fracture. 2. No evidence of acute fracture or traumatic malalignment the cervical  spine. Electronically Signed   By: Margaretha Sheffield M.D.   On: 10/30/2021 11:28   CT Cervical Spine Wo Contrast  Result Date: 10/30/2021 CLINICAL DATA:  Head trauma, moderate-severe; Facial trauma, blunt; Neck trauma, dangerous injury mechanism (Age 41-64y) EXAM: CT HEAD WITHOUT CONTRAST CT MAXILLOFACIAL WITHOUT CONTRAST CT CERVICAL SPINE WITHOUT CONTRAST TECHNIQUE: Multidetector CT imaging of the head, cervical spine, and maxillofacial structures were performed using the standard protocol without intravenous contrast. Multiplanar CT image reconstructions of the cervical spine and maxillofacial structures were also generated. RADIATION DOSE REDUCTION: This exam was performed according to the departmental dose-optimization program which includes automated exposure control, adjustment of the mA and/or kV according to patient size and/or use of iterative reconstruction technique. COMPARISON:  None Available. FINDINGS: CT HEAD FINDINGS Brain: No evidence of acute infarction, hemorrhage, hydrocephalus, extra-axial collection or mass lesion/mass effect. Vascular: No hyperdense vessel. Skull: No acute fracture. Other: No mastoid effusions. CT MAXILLOFACIAL FINDINGS Osseous: No fracture or mandibular dislocation. No destructive process. Orbits: Negative. No traumatic or inflammatory finding. Sinuses: Minimal inferior maxillary sinus mucosal thickening. Otherwise, clear. Soft tissues: Negative. CT CERVICAL SPINE FINDINGS  Alignment: Reversal of normal cervical lordosis. Skull base and vertebrae: Vertebral body heights are maintained. No acute fracture Soft tissues and spinal canal: No prevertebral fluid or swelling. No visible canal hematoma. Disc levels:  No significant focal bony degenerative change. Upper chest: Visualized lung apices are clear. IMPRESSION: 1. No evidence of acute intracranial abnormality or facial fracture. 2. No evidence of acute fracture or traumatic malalignment the cervical spine. Electronically  Signed   By: Margaretha Sheffield M.D.   On: 10/30/2021 11:28   CT Maxillofacial Wo Contrast  Result Date: 10/30/2021 CLINICAL DATA:  Head trauma, moderate-severe; Facial trauma, blunt; Neck trauma, dangerous injury mechanism (Age 63-64y) EXAM: CT HEAD WITHOUT CONTRAST CT MAXILLOFACIAL WITHOUT CONTRAST CT CERVICAL SPINE WITHOUT CONTRAST TECHNIQUE: Multidetector CT imaging of the head, cervical spine, and maxillofacial structures were performed using the standard protocol without intravenous contrast. Multiplanar CT image reconstructions of the cervical spine and maxillofacial structures were also generated. RADIATION DOSE REDUCTION: This exam was performed according to the departmental dose-optimization program which includes automated exposure control, adjustment of the mA and/or kV according to patient size and/or use of iterative reconstruction technique. COMPARISON:  None Available. FINDINGS: CT HEAD FINDINGS Brain: No evidence of acute infarction, hemorrhage, hydrocephalus, extra-axial collection or mass lesion/mass effect. Vascular: No hyperdense vessel. Skull: No acute fracture. Other: No mastoid effusions. CT MAXILLOFACIAL FINDINGS Osseous: No fracture or mandibular dislocation. No destructive process. Orbits: Negative. No traumatic or inflammatory finding. Sinuses: Minimal inferior maxillary sinus mucosal thickening. Otherwise, clear. Soft tissues: Negative. CT CERVICAL SPINE FINDINGS Alignment: Reversal of normal cervical lordosis. Skull base and vertebrae: Vertebral body heights are maintained. No acute fracture Soft tissues and spinal canal: No prevertebral fluid or swelling. No visible canal hematoma. Disc levels:  No significant focal bony degenerative change. Upper chest: Visualized lung apices are clear. IMPRESSION: 1. No evidence of acute intracranial abnormality or facial fracture. 2. No evidence of acute fracture or traumatic malalignment the cervical spine. Electronically Signed   By: Margaretha Sheffield M.D.   On: 10/30/2021 11:28   DG Elbow Complete Left  Result Date: 10/30/2021 CLINICAL DATA:  Syncopal episode.  Fell.  Left elbow pain. EXAM: LEFT ELBOW - COMPLETE 3+ VIEW COMPARISON:  None Available. FINDINGS: The joint spaces are maintained. No acute elbow fracture. No osteochondral lesion. No joint effusion. IMPRESSION: No acute bony findings. Electronically Signed   By: Marijo Sanes M.D.   On: 10/30/2021 10:37   VAS US AORTA/IVC/ILIACS  Result Date: 10/30/2021 ABDOMINAL AORTA STUDY Patient Name:  Sonya Harmon  Date of Exam:   10/29/2021 Medical Rec #: [id number]       Accession #:    [id number] Date of Birth: February 08, 1971       Patient Gender: F Patient Age:   27 years Exam Location:  [facility] Procedure:      VAS US AORTA/IVC/ILIACS Referring Phys: Rogue Jury ARIDA --------------------------------------------------------------------------------  Risk Factors: Hyperlipidemia, current smoker, coronary artery disease. Vascular Interventions: 10/15/21-Successful drug-eluting stent placement to the                         left external iliac artery and drug-coated balloon                         angioplasty to the distal left common iliac artery.  Comparison Study: 09/25/21-Left external iliac velocity was 325 cm/s. Performing Technologist: Wilkie Aye RVT  Examination Guidelines: A complete evaluation includes B-mode imaging, spectral  Doppler, color Doppler, and power Doppler as needed of all accessible portions of each vessel. Bilateral testing is considered an integral part of a complete examination. Limited examinations for reoccurring indications may be performed as noted.  Abdominal Aorta Findings: +-------------+-------+----------+----------+--------+--------+--------+ Location     AP (cm)Trans (cm)PSV (cm/s)WaveformThrombusComments +-------------+-------+----------+----------+--------+--------+--------+ Proximal     1.90   2.00      70                                  +-------------+-------+----------+----------+--------+--------+--------+ Mid                           59                                 +-------------+-------+----------+----------+--------+--------+--------+ Distal                        57                                 +-------------+-------+----------+----------+--------+--------+--------+ RT CIA Prox                   167       biphasic                 +-------------+-------+----------+----------+--------+--------+--------+ RT CIA Mid                    193       biphasic                 +-------------+-------+----------+----------+--------+--------+--------+ RT CIA Distal                 90        biphasic                 +-------------+-------+----------+----------+--------+--------+--------+ RT EIA Prox                   155       biphasic                 +-------------+-------+----------+----------+--------+--------+--------+ RT EIA Mid                    182       biphasic                 +-------------+-------+----------+----------+--------+--------+--------+ RT EIA Distal                 179       biphasic                 +-------------+-------+----------+----------+--------+--------+--------+ LT CIA Prox                   108       biphasic                 +-------------+-------+----------+----------+--------+--------+--------+ LT CIA Mid                    167       biphasic                 +-------------+-------+----------+----------+--------+--------+--------+ Mild decrease in left ABI; performed left lower extremity duplex. No significant stenosis  noted. IVC/Iliac Findings: +--------+------+   IVC   Patent +--------+------+ IVC Proxpatent +--------+------+   Left Stent(s): +---------------+--------+--------+--------+--------+ Distal CIA/EIA PSV cm/sStenosisWaveformComments +---------------+--------+--------+--------+--------+ Prox to Stent  186              biphasic         +---------------+--------+--------+--------+--------+ Proximal Stent 162             biphasic         +---------------+--------+--------+--------+--------+ Mid Stent      105             biphasic         +---------------+--------+--------+--------+--------+ Distal Stent   87              biphasic         +---------------+--------+--------+--------+--------+ Distal to [id number]             biphasic         +---------------+--------+--------+--------+--------+  Summary: Abdominal Aorta: The largest aortic measurement is 2.0 cm. Stenosis: +--------------------+-------------+-----------+ Location            Stenosis     Stent       +--------------------+-------------+-----------+ Right Common Iliac  <50% stenosis            +--------------------+-------------+-----------+ Left Common Iliac                no stenosis +--------------------+-------------+-----------+ Right External Iliac<50% stenosis            +--------------------+-------------+-----------+ Left External Iliac              no stenosis +--------------------+-------------+-----------+ Atherosclerosis in the aorta and iliac arteries with no focal stenosis, s/p left iliac stent. Mild decrease in left ABI; performed left lower extremity duplex. No significant stenosis noted. IVC/Iliac: There is no evidence of thrombus involving the IVC.  *See table(s) above for measurements and observations. Suggest follow up study in 6 months.  Electronically signed by Jenkins Rouge MD on 10/30/2021 at 7:56:46 AM.    Final    VAS [place] ABI WITH/WO TBI  Result Date: 10/30/2021  LOWER EXTREMITY DOPPLER STUDY Patient Name:  Sonya Harmon  Date of Exam:   10/29/2021 Medical Rec #: [id number]       Accession #:    [id number] Date of Birth: [birth year]       Patient Gender: F Patient Age:   46 years Exam Location:  [facility] Procedure:      VAS [place] ABI WITH/WO TBI Referring Phys: [facility] ARIDA  --------------------------------------------------------------------------------  Indications: Peripheral artery disease. High Risk Factors: Hyperlipidemia, current smoker. Other Factors: Patient states her symptoms have improved since her procedure.                Denies claudication.  Vascular Interventions: 10/15/21-Successful drug-eluting stent placement to the                         left external iliac artery and drug-coated balloon                         angioplasty to the distal left common iliac artery. Comparison Study: 10/15/21-Right 1.07; Light 1.09 Performing Technologist: Wilkie Aye RVT  Examination Guidelines: A complete evaluation includes at minimum, Doppler waveform signals and systolic blood pressure reading at the level of bilateral brachial, anterior tibial, and posterior tibial arteries, when vessel segments are accessible. Bilateral testing is considered an integral part of a complete examination. Photoelectric Plethysmograph (PPG) waveforms and  toe systolic pressure readings are included as required and additional duplex testing as needed. Limited examinations for reoccurring indications may be performed as noted.  ABI Findings: +---------+------------------+-----+---------+--------+ Right    Rt Pressure (mmHg)IndexWaveform Comment  +---------+------------------+-----+---------+--------+ Brachial 149                                      +---------+------------------+-----+---------+--------+ PTA      158               1.04 triphasic         +---------+------------------+-----+---------+--------+ PERO     139               0.91 triphasic         +---------+------------------+-----+---------+--------+ DP       140               0.92 triphasic         +---------+------------------+-----+---------+--------+ Great Toe162               1.07 Normal            +---------+------------------+-----+---------+--------+  +---------+------------------+-----+---------+-------+ Left     Lt Pressure (mmHg)IndexWaveform Comment +---------+------------------+-----+---------+-------+ Brachial 152                                     +---------+------------------+-----+---------+-------+ PTA      141               0.93 triphasic        +---------+------------------+-----+---------+-------+ PERO     127               0.85 biphasic         +---------+------------------+-----+---------+-------+ DP       134               0.88 biphasic         +---------+------------------+-----+---------+-------+ Doristine Devoid Toe154               1.01 Abnormal         +---------+------------------+-----+---------+-------+ +-------+-----------+-----------+------------+------------+ ABI/TBIToday's ABIToday's TBIPrevious ABIPrevious TBI +-------+-----------+-----------+------------+------------+ Right  1.04       1.07       1.07        0.60         +-------+-----------+-----------+------------+------------+ Left   0.93       1.01       1.09        0.48         +-------+-----------+-----------+------------+------------+  Right ABIs appear essentially unchanged compared to prior study on 10/2021. Left ABIs appear decreased compared to prior study on 10/2021.  Summary: Right: Resting right ankle-brachial index is within normal range. No evidence of significant right lower extremity arterial disease. The right toe-brachial index is normal. Left: Resting left ankle-brachial index indicates mild left lower extremity arterial disease. The left toe-brachial index is normal. *See table(s) above for measurements and observations.  Suggest follow up study in 6 months. Electronically signed by Jenkins Rouge MD on 10/30/2021 at 7:52:15 AM.    Final    PERIPHERAL VASCULAR CATHETERIZATION  Result Date: 10/15/2021 1.  Severely calcified aorta with no evidence of aneurysm or obstructive disease. 2.  Severe ostial left renal artery  stenosis 3. No significant iliac disease on the right side 4.  Severe left external iliac artery stenosis as well  as significant distal left common iliac artery stenosis. 5.  Successful drug-eluting stent placement to the left external iliac artery and drug-coated balloon angioplasty to the distal left common iliac artery. Recommendations: The patient is already on dual antiplatelet therapy which should be continued for at least 6 months. Aggressive treatment of risk factors. Left renal artery stenting can be considered if there is clinical indication.   DG Chest 2 View  Result Date: 10/10/2021 CLINICAL DATA:  A 51 year old female presents for evaluation of dizziness and chest pain. EXAM: CHEST - 2 VIEW COMPARISON:  September 07, 2021. FINDINGS: EKG lead stickers project over the chest. Cardiomediastinal contours and hilar structures are normal. Lungs are hyperinflated mildly with no sign of pleural effusion, consolidation or pulmonary edema. Signs of prior percutaneous coronary intervention project over the cardiac silhouette on the lateral view. On limited assessment there is no acute skeletal finding. IMPRESSION: Mild hyperinflation. No acute cardiopulmonary disease. Signs of prior percutaneous coronary intervention Electronically Signed   By: Zetta Bills M.D.   On: 10/10/2021 16:34   (Echo, Carotid, EGD, Colonoscopy, ERCP)    Subjective: Pt c/o fatigue    Discharge Exam: Vitals:   11/02/21 0720 11/02/21 1126  BP: 112/71 115/66  Pulse: 72 63  Resp: 16 18  Temp: 98.5 F (36.9 C) 98.1 F (36.7 C)  SpO2: 100% 100%   Vitals:   11/01/21 2356 11/02/21 0400 11/02/21 0720 11/02/21 1126  BP: 117/67 100/65 112/71 115/66  Pulse: 84 72 72 63  Resp: '15 16 16 18  '$ Temp: 99.7 F (37.6 C) 98.5 F (36.9 C) 98.5 F (36.9 C) 98.1 F (36.7 C)  TempSrc:      SpO2: 100% 100% 100% 100%  Weight:      Height:        General: Pt is alert, awake, not in acute distress Cardiovascular: S1/S2 +, no rubs, no  gallops Respiratory: CTA bilaterally, no wheezing, no rhonchi Abdominal: Soft, NT, ND, bowel sounds + Extremities: no edema, no cyanosis    The results of significant diagnostics from this hospitalization (including imaging, microbiology, ancillary and laboratory) are listed below for reference.     Microbiology: Recent Results (from the past 240 hour(s))  Blood culture (routine x 2)     Status: None (Preliminary result)   Collection Time: 10/30/21 11:58 AM   Specimen: BLOOD  Result Value Ref Range Status   Specimen Description BLOOD LEFT ANTECUBITAL  Final   Special Requests   Final    BOTTLES DRAWN AEROBIC AND ANAEROBIC Blood Culture adequate volume   Culture   Final    NO GROWTH 3 DAYS Performed at [facility], [address] [place], [place] [address]    Report Status PENDING  Incomplete  Blood culture (routine x 2)     Status: None (Preliminary result)   Collection Time: 10/30/21 12:00 PM   Specimen: BLOOD  Result Value Ref Range Status   Specimen Description BLOOD BLOOD LEFT HAND  Final   Special Requests   Final    BOTTLES DRAWN AEROBIC ONLY Blood Culture adequate volume   Culture   Final    NO GROWTH 3 DAYS Performed at [facility], [address], [place], [place] [address]    Report Status PENDING  Incomplete     Labs: BNP (last 3 results) Recent Labs    09/07/21 1125  BNP 45.8   Basic Metabolic Panel: Recent Labs  Lab 10/30/21 1001 10/31/21 0515 11/01/21 0719 11/02/21 0645  NA  129* 139 137 133*  K 3.7 3.3* 3.2* 3.4*  CL 97* 111 111 106  CO2 19* '22 23 23  '$ GLUCOSE 123* 88 94 95  BUN 32* '17 8 6  '$ CREATININE 0.82 0.72 0.54 0.62  CALCIUM 8.1* 8.1* 8.3* 8.3*  MG 1.9  --   --   --    Liver Function Tests: Recent Labs  Lab 10/30/21 1001  AST 34  ALT 18  ALKPHOS 53  BILITOT 0.6  PROT 6.2*  ALBUMIN 3.5   No results for input(s): "LIPASE", "AMYLASE" in the last 168 hours. No results for input(s): "AMMONIA" in the last 168  hours. CBC: Recent Labs  Lab 10/30/21 1001 10/31/21 0515 10/31/21 2017 11/01/21 0719 11/02/21 0645  WBC 11.5* 5.8  --  5.5 3.3*  HGB 8.6* 6.8* 8.9* 8.8* 8.4*  HCT 25.9* 20.4* 26.1* 26.8* 25.3*  MCV 96.3 94.4  --  95.7 94.8  PLT 342 251  --  216 211   Cardiac Enzymes: No results for input(s): "CKTOTAL", "CKMB", "CKMBINDEX", "TROPONINI" in the last 168 hours. BNP: Invalid input(s): "POCBNP" CBG: No results for input(s): "GLUCAP" in the last 168 hours. D-Dimer No results for input(s): "DDIMER" in the last 72 hours. Hgb A1c No results for input(s): "HGBA1C" in the last 72 hours. Lipid Profile No results for input(s): "CHOL", "HDL", "LDLCALC", "TRIG", "CHOLHDL", "LDLDIRECT" in the last 72 hours. Thyroid function studies No results for input(s): "TSH", "T4TOTAL", "T3FREE", "THYROIDAB" in the last 72 hours.  Invalid input(s): "FREET3" Anemia work up Recent Labs    10/30/21 1159 10/31/21 0515  VITAMINB12  --  170*  FOLATE 7.2  --   FERRITIN 20  --   TIBC 351  --   IRON 56  --    Urinalysis    Component Value Date/Time   COLORURINE COLORLESS (A) 09/07/2021 1308   APPEARANCEUR CLEAR (A) 09/07/2021 1308   LABSPEC 1.001 (L) 09/07/2021 1308   PHURINE 6.0 09/07/2021 1308   GLUCOSEU NEGATIVE 09/07/2021 1308   HGBUR NEGATIVE 09/07/2021 1308   BILIRUBINUR NEGATIVE 09/07/2021 1308   KETONESUR NEGATIVE 09/07/2021 1308   PROTEINUR NEGATIVE 09/07/2021 1308   NITRITE NEGATIVE 09/07/2021 1308   LEUKOCYTESUR NEGATIVE 09/07/2021 1308   Sepsis Labs Recent Labs  Lab 10/30/21 1001 10/31/21 0515 11/01/21 0719 11/02/21 0645  WBC 11.5* 5.8 5.5 3.3*   Microbiology Recent Results (from the past 240 hour(s))  Blood culture (routine x 2)     Status: None (Preliminary result)   Collection Time: 10/30/21 11:58 AM   Specimen: BLOOD  Result Value Ref Range Status   Specimen Description BLOOD LEFT ANTECUBITAL  Final   Special Requests   Final    BOTTLES DRAWN AEROBIC AND ANAEROBIC  Blood Culture adequate volume   Culture   Final    NO GROWTH 3 DAYS Performed at [facility], [address] [place], [place] [address]    Report Status PENDING  Incomplete  Blood culture (routine x 2)     Status: None (Preliminary result)   Collection Time: 10/30/21 12:00 PM   Specimen: BLOOD  Result Value Ref Range Status   Specimen Description BLOOD BLOOD LEFT HAND  Final   Special Requests   Final    BOTTLES DRAWN AEROBIC ONLY Blood Culture adequate volume   Culture   Final    NO GROWTH 3 DAYS Performed at [facility], [address], [place], [place] [address]    Report Status PENDING  Incomplete  Time coordinating discharge: Over 30 minutes  SIGNED:   Wyvonnia Dusky, MD  [facility] 11/02/2021, 11:32 AM Pager   If 7PM-7AM, please contact night-coverage [email or web address]

## 2021-11-03 ENCOUNTER — Encounter: Payer: Self-pay | Admitting: Gastroenterology

## 2021-11-04 LAB — CULTURE, BLOOD (ROUTINE X 2)
Culture: NO GROWTH
Culture: NO GROWTH
Special Requests: ADEQUATE
Special Requests: ADEQUATE

## 2021-11-05 LAB — SURGICAL PATHOLOGY

## 2021-11-17 ENCOUNTER — Telehealth: Payer: Self-pay

## 2021-11-17 NOTE — Telephone Encounter (Signed)
Patient verbalized understanding of results. She made a 3 month office visit and will set up the EGD at this time

## 2021-11-17 NOTE — Telephone Encounter (Signed)
-----   Message from Lin Landsman, MD sent at 11/13/2021  5:23 PM EDT ----- Patient needs repeat upper endoscopy in 3 to 4 months after obtaining cardiac clearance.  Recommend clinic follow-up in 3 to 4 months Diagnosis:  peptic ulcer disease  RV

## 2021-11-22 NOTE — Progress Notes (Unsigned)
Cardiology Clinic Note   Patient Name: Sonya Harmon Date of Encounter: 11/24/2021  Primary Care Provider:  Leonides Sake, MD Primary Cardiologist:  Ida Rogue, MD  Patient Profile    51 year old female with a past medical history of coronary artery disease, peripheral arterial disease, history of tongue cancer, tobacco use, alcohol abuse, and hyperlipidemia, who is here today to follow-up on her CAD.  Past Medical History    Past Medical History:  Diagnosis Date   Alcohol abuse    Coronary artery disease 09/2021   s/p PCI to RCA in setting of unstable angina   History of tongue cancer    PAD (peripheral artery disease) (HCC)    Tobacco abuse    Past Surgical History:  Procedure Laterality Date   ABDOMINAL AORTOGRAM W/LOWER EXTREMITY N/A 10/15/2021   Procedure: ABDOMINAL AORTOGRAM W/LOWER EXTREMITY;  Surgeon: Wellington Hampshire, MD;  Location: [place] [facility];  Service: Cardiovascular;  Laterality: N/A;   BILATERAL SALPINGECTOMY     CORONARY BALLOON ANGIOPLASTY N/A 09/08/2021   Procedure: CORONARY BALLOON ANGIOPLASTY - ABORTED;  Surgeon: Wellington Hampshire, MD;  Location: [place] [facility];  Service: Cardiovascular;  Laterality: N/A;   CORONARY STENT INTERVENTION N/A 09/10/2021   Procedure: CORONARY STENT INTERVENTION;  Surgeon: Wellington Hampshire, MD;  Location: [place] [facility];  Service: Cardiovascular;  Laterality: N/A;   ESOPHAGOGASTRODUODENOSCOPY (EGD) WITH PROPOFOL N/A 11/01/2021   Procedure: ESOPHAGOGASTRODUODENOSCOPY (EGD) WITH PROPOFOL;  Surgeon: Lin Landsman, MD;  Location: [facility];  Service: Gastroenterology;  Laterality: N/A;   LEFT HEART CATH AND CORONARY ANGIOGRAPHY N/A 09/08/2021   Procedure: LEFT HEART CATH AND CORONARY ANGIOGRAPHY;  Surgeon: Dionisio David, MD;  Location: [place] [facility];  Service: Cardiovascular;  Laterality: N/A;   PERIPHERAL VASCULAR INTERVENTION  10/15/2021   Procedure: PERIPHERAL VASCULAR INTERVENTION;   Surgeon: Wellington Hampshire, MD;  Location: [place] [facility];  Service: Cardiovascular;;   TONGUE SURGERY      Allergies  Allergies  Allergen Reactions   Amoxicil &Clarithro &Lansopraz Nausea And Vomiting   Amoxicillin Rash    History of Present Illness    51 year old female with a history of CAD status post PCI to the RCA in 09/2021, PAD status post stenting to the left external iliac artery with drug-coated balloon angioplasty to the distal left common iliac artery, tongue cancer, hyperlipidemia, tobacco use, alcohol use, who had recent syncopal episode related to GI bleed who was hospitalized.  She was admitted to the hospital 09/2020 with unstable angina underwent cardiac cath by Dr. Humphrey Rolls from the right femoral artery which showed severe mid RCA stenosis.  PCI was planned, but the patient large groin hematoma.  She was noted to have significant bilateral iliac disease as well.  She underwent staged PCI via the right radial artery.  She was seen on 10/10/2021 after syncopal episode.  She was evaluated in the emergency department.  Was noted to have troponins negative x2, D-dimer was normal, she was given a liter of fluid and discharged home.  Labs at the time showed stable anemia with a hemoglobin of 10.7.  She was noted to have significant bilateral lower extremity claudication to the iliac disease.  She underwent lower extremity angiography on 10/15/2021 which showed severely calcified aorta with no obstructive disease.  There was severe ostial left renal artery stenosis, no significant iliac disease on the right side, and severe left external iliac artery stenosis extending to the distal left common  iliac artery.  She underwent successful DES placement to the left external iliac artery and drug coated balloon angioplasty to the distal left common iliac artery.  A stent was not placed in an effort to avoid jailing of the internal iliac artery which was severely diseased at the ostium.  Postprocedure  duplex showed abnormal iliac velocities, and her claudication had resolved.  She was last seen in clinic on 10/30/2021 where she was not feeling well over the preceding 3 days with increased dizziness, poor oral oral intake, abdominal pain, emesis, and dark stools.  She also reported having recently vomited dark fluid.  Prior to her visit she reported having a syncopal episode at home in her bathroom while having a BM and hitting her head.  She subsequently drove to the office for her visit and continued not to feel well.  Her blood pressure lying down was 86/61, standing 72/48.  EKG in the office showed sinus rhythm with nonspecific ST-T wave changes with no evidence of ST elevation.  Rapid response was called and she was transferred to the emergency department.  She was evaluated and treated by gastroenterology and was subsequently discharged on 11/02/2021.  She returns to clinic today posthospitalization accompanied by her daughter.  She denies any chest pain, shortness of breath, or claudication symptoms today.  She states that she has been doing well.  She has been unable to continue PPI therapy at this time as they have continue to wait for the prior authorization for her omeprazole 40 mg twice daily.  She is requesting to start medication for smoking cessation and alcohol cessation today as well.  Since her discharge she has not had any recurrent visits to the emergency department.  Home Medications    Current Outpatient Medications  Medication Sig Dispense Refill   buPROPion (WELLBUTRIN SR) 150 MG 12 hr tablet Take 1 tablet (150 mg total) by mouth 2 (two) times daily. Take 1 tablet daily for the first three days. Then take 1 tablet twice daily. 180 tablet 3   clopidogrel (PLAVIX) 75 MG tablet Take 1 tablet (75 mg total) by mouth daily. 30 tablet 11   cyanocobalamin 1000 MCG tablet Take 1 tablet (1,000 mcg total) by mouth daily. 30 tablet 0   ferrous sulfate 325 (65 FE) MG tablet Take 1 tablet (325  mg total) by mouth daily. 30 tablet 0   omeprazole (PRILOSEC) 40 MG capsule Take 1 capsule (40 mg total) by mouth 2 (two) times daily before a meal. 60 capsule 2   atorvastatin (LIPITOR) 80 MG tablet Take 1 tablet (80 mg total) by mouth daily. 90 tablet 3   metoprolol tartrate (LOPRESSOR) 25 MG tablet Take 0.5 tablets (12.5 mg total) by mouth 2 (two) times daily. 90 tablet 3   No current facility-administered medications for this visit.     Family History    Family History  Problem Relation Age of Onset   Cancer Mother    Cancer Father    Stroke Sister    Aneurysm Sister    She indicated that her mother is deceased. She indicated that her father is deceased. She indicated that her sister is alive.  Social History    Social History   Socioeconomic History   Marital status: Married    Spouse name: Not on file   Number of children: Not on file   Years of education: Not on file   Highest education level: Not on file  Occupational History   Not on file  Tobacco Use   Smoking status: Former    Packs/day: 0.50    Types: Cigarettes    Quit date: 09/07/2021    Years since quitting: 0.2   Smokeless tobacco: Former  Scientific laboratory technician Use: Former  Substance and Sexual Activity   Alcohol use: Not Currently    Alcohol/week: 42.0 standard drinks of alcohol    Types: 42 Cans of beer per week   Drug use: Not Currently    Types: Marijuana   Sexual activity: Yes    Partners: Male  Other Topics Concern   Not on file  Social History Narrative   ** Merged History Encounter **       Social Determinants of Health   Financial Resource Strain: Not on file  Food Insecurity: Not on file  Transportation Needs: Not on file  Physical Activity: Not on file  Stress: Not on file  Social Connections: Not on file  Intimate Partner Violence: Not on file     Review of Systems    General:  No chills, fever, night sweats or weight changes.  Cardiovascular:  No chest pain, dyspnea on  exertion, edema, orthopnea, palpitations, paroxysmal nocturnal dyspnea. Dermatological: No rash, lesions/masses Respiratory: No cough, dyspnea Urologic: No hematuria, dysuria Abdominal:   No nausea, vomiting, diarrhea, bright red blood per rectum, melena, or hematemesis, endorses changes in appetite Neurologic:  No visual changes, wkns, changes in mental status. All other systems reviewed and are otherwise negative except as noted above.  Physical Exam    VS:  BP 118/76 (BP Location: Left Arm, Patient Position: Sitting, Cuff Size: Normal)   Pulse (!) 48   Ht '5\' 2"'$  (1.575 m)   Wt 104 lb 6.4 oz (47.4 kg)   SpO2 100%   BMI 19.10 kg/m  , BMI Body mass index is 19.1 kg/m.     GEN: Well nourished, well developed, in no acute distress. HEENT: normal. Neck: Supple, no JVD, carotid bruits, or masses. Cardiac: RRR, no murmurs, rubs, or gallops. No clubbing, cyanosis, edema.  Radials/DP/PT 2+ and equal bilaterally.  Respiratory:  Respirations regular and unlabored, clear to auscultation bilaterally. GI: Soft, nontender, nondistended, BS + x 4. MS: no deformity or atrophy. Skin: warm and dry, no rash. Neuro:  Strength and sensation are intact. Psych: Normal affect.  Accessory Clinical Findings    ECG personally reviewed by me today-sinus bradycardia with a rate of 48 she does have peaked T waves diffusely and ST depression T wave inversions in aVR, aVL, V1 and V2- No acute changes  Lab Results  Component Value Date   WBC 3.3 (L) 11/02/2021   HGB 8.4 (L) 11/02/2021   HCT 25.3 (L) 11/02/2021   MCV 94.8 11/02/2021   PLT 211 11/02/2021   Lab Results  Component Value Date   CREATININE 0.62 11/02/2021   BUN 6 11/02/2021   NA 133 (L) 11/02/2021   K 3.4 (L) 11/02/2021   CL 106 11/02/2021   CO2 23 11/02/2021   Lab Results  Component Value Date   ALT 18 10/30/2021   AST 34 10/30/2021   ALKPHOS 53 10/30/2021   BILITOT 0.6 10/30/2021   Lab Results  Component Value Date   CHOL 177  09/09/2021   HDL 95 09/09/2021   LDLCALC 76 09/09/2021   TRIG 30 09/09/2021   CHOLHDL 1.9 09/09/2021    No results found for: "HGBA1C"  Assessment & Plan   1.  Coronary disease involving native coronary arteries without angina history of  stent to RCA.  EKG completed today with no ischemic changes.  She denies any anginal symptoms at the present time.  She has been continued on Plavix 75 mg daily, asa has been discontinued for recent GI bleed, she will continue statin and beta-blocker therapy.  2.  Peripheral arterial disease status post recent left iliac stent.  Duplex that shows normal velocities.  She denies any symptoms of claudication today.  She will have follow-up ABIs in 6 months those will be scheduled today.  She has been continued on Plavix and but was taken off of aspirin after recent GI bleed.  He has been encouraged to continue with smoking cessation.  3.  Hyperlipidemia with an LDL of 76 last checked on 09/09/2021.  She has been continued on atorvastatin 80 mg a daily.  4.  Recent GI bleed patient was hospitalized and scoped by gastroenterology.  Unfortunately she has been unable to be on PPI therapy as she is still waiting for prior authorization through the insurance to cover her medication that was recommended.  She does have follow-up with GI coming up.  She has denied any more dizziness, syncope, or noting any blood in her stool or urine.  5.  Tobacco abuse she has started smoking small amounts again is asking today for to take something for smoking cessation.  Her daughter is requesting that she be started on Wellbutrin to help with her smoking cessation as well as her anxiety depression and recent appetite suppression which seems reasonable so she can be started on Wellbutrin 150 mg twice daily for 3 days she was to take 150 mg daily and increase to twice daily on the fourth day.  6.  Disposition patient to return to see MD/APP in 2 months.  She also requested a return to work  note today stating that she was safe to return she feels well with no complaints and it is stable to let her return to work at this time.  Keiron Iodice, NP 11/24/2021, 11:10 AM

## 2021-11-24 ENCOUNTER — Encounter: Payer: Self-pay | Admitting: Cardiology

## 2021-11-24 ENCOUNTER — Ambulatory Visit (INDEPENDENT_AMBULATORY_CARE_PROVIDER_SITE_OTHER): Payer: Commercial Managed Care - HMO | Admitting: Cardiology

## 2021-11-24 VITALS — BP 118/76 | HR 48 | Ht 62.0 in | Wt 104.4 lb

## 2021-11-24 DIAGNOSIS — Z720 Tobacco use: Secondary | ICD-10-CM | POA: Diagnosis not present

## 2021-11-24 DIAGNOSIS — I2510 Atherosclerotic heart disease of native coronary artery without angina pectoris: Secondary | ICD-10-CM | POA: Diagnosis not present

## 2021-11-24 DIAGNOSIS — E785 Hyperlipidemia, unspecified: Secondary | ICD-10-CM | POA: Diagnosis not present

## 2021-11-24 DIAGNOSIS — I739 Peripheral vascular disease, unspecified: Secondary | ICD-10-CM | POA: Diagnosis not present

## 2021-11-24 DIAGNOSIS — K922 Gastrointestinal hemorrhage, unspecified: Secondary | ICD-10-CM

## 2021-11-24 MED ORDER — ATORVASTATIN CALCIUM 80 MG PO TABS: 80.0000 mg | ORAL_TABLET | Freq: Every day | ORAL | 3 refills | Status: DC

## 2021-11-24 MED ORDER — METOPROLOL TARTRATE 25 MG PO TABS: 12.5000 mg | ORAL_TABLET | Freq: Two times a day (BID) | ORAL | 3 refills | Status: DC

## 2021-11-24 MED ORDER — BUPROPION HCL ER (SR) 150 MG PO TB12: 150.0000 mg | ORAL_TABLET | Freq: Two times a day (BID) | ORAL | 3 refills | Status: DC

## 2021-11-24 NOTE — Patient Instructions (Addendum)
Medication Instructions:  Your physician has recommended you make the following change in your medication: 1) START taking Wellbutrin (bupropion) 150 mg once daily for three days, then twice daily.   *If you need a refill on your cardiac medications before your next appointment, please call your pharmacy*  Testing/Procedures: Your physician has requested that you have an ankle brachial index (ABI) in January 2024. During this test an ultrasound and blood pressure cuff are used to evaluate the arteries that supply the arms and legs with blood. Allow thirty minutes for this exam. There are no restrictions or special instructions.  Follow-Up: At [facility], you and your health needs are our priority.  As part of our continuing mission to provide you with exceptional heart care, we have created designated Provider Care Teams.  These Care Teams include your primary Cardiologist (physician) and Advanced Practice Providers (APPs -  Physician Assistants and Nurse Practitioners) who all work together to provide you with the care you need, when you need it.  Your next appointment:   2 month(s)  The format for your next appointment:   In Person  Provider:   You may see Ida Rogue, MD or one of the following Advanced Practice Providers on your designated Care Team:   Murray Hodgkins, NP Christell Faith, PA-C Cadence Kathlen Mody, PA-C Gerrie Nordmann, NP  Important Information About Sugar

## 2021-12-02 NOTE — Addendum Note (Signed)
Addended by: James Ivanoff D on: 12/02/2021 08:18 AM   Modules accepted: Orders

## 2022-01-21 ENCOUNTER — Telehealth: Payer: Self-pay | Admitting: Cardiology

## 2022-01-21 ENCOUNTER — Other Ambulatory Visit: Payer: Self-pay

## 2022-01-21 ENCOUNTER — Emergency Department
Admission: EM | Admit: 2022-01-21 | Discharge: 2022-01-21 | Disposition: A | Discharge status: Home / Self Care | Payer: Commercial Managed Care - HMO | Attending: Emergency Medicine | Admitting: Emergency Medicine

## 2022-01-21 DIAGNOSIS — R040 Epistaxis: Secondary | ICD-10-CM | POA: Diagnosis present

## 2022-01-21 DIAGNOSIS — I2510 Atherosclerotic heart disease of native coronary artery without angina pectoris: Secondary | ICD-10-CM | POA: Diagnosis not present

## 2022-01-21 MED ORDER — OXYMETAZOLINE HCL 0.05 % NA SOLN
1.0000 | Freq: Once | NASAL | Status: DC
  Filled 2022-01-21: qty 30

## 2022-01-21 MED ORDER — TRANEXAMIC ACID FOR EPISTAXIS
500.0000 mg | Freq: Once | TOPICAL | Status: AC
  Administered 2022-01-21: 500 mg via TOPICAL
  Filled 2022-01-21: qty 10

## 2022-01-21 MED ORDER — LIDOCAINE VISCOUS HCL 2 % MT SOLN
15.0000 mL | Freq: Once | OROMUCOSAL | Status: AC
  Administered 2022-01-21: 15 mL via OROMUCOSAL
  Filled 2022-01-21: qty 15

## 2022-01-21 MED ORDER — CEPHALEXIN 500 MG PO CAPS: 500.0000 mg | ORAL_CAPSULE | Freq: Two times a day (BID) | ORAL | 0 refills | Status: DC

## 2022-01-21 NOTE — ED Notes (Signed)
EDP at bedside, EDP gave meds for nosebleed.

## 2022-01-21 NOTE — ED Provider Notes (Signed)
[facility] Provider Note    Event Date/Time   First MD Initiated Contact with Patient 01/21/22 0845     (approximate)   History   Epistaxis   HPI  Sonya Harmon is a 51 y.o. female with history of CAD, alcohol abuse, PAD, tobacco abuse who presents with epistaxis.  Patient reports she woke up with her right naris bleeding.  She tried holding pressure with little success.     Physical Exam   Triage Vital Signs: ED Triage Vitals  Enc Vitals Group     BP 01/21/22 0827 (!) 138/94     Pulse Rate 01/21/22 0827 74     Resp 01/21/22 0827 18     Temp 01/21/22 0827 98.4 F (36.9 C)     Temp src --      SpO2 01/21/22 0827 96 %     Weight 01/21/22 0826 46.3 kg (102 lb)     Height 01/21/22 0826 1.575 m ('5\' 2"'$ )     Head Circumference --      Peak Flow --      Pain Score 01/21/22 0826 0     Pain Loc --      Pain Edu? --      Excl. in [place]? --     Most recent vital signs: Vitals:   01/21/22 0827  BP: (!) 138/94  Pulse: 74  Resp: 18  Temp: 98.4 F (36.9 C)  SpO2: 96%     General: Awake, no distress.  CV:  Good peripheral perfusion.  Resp:  Normal effort.  Abd:  No distention.  Other:  Nose: Bleeding from the right naris, clamp in place, currently controlled   ED Results / Procedures / Treatments   Labs (all labs ordered are listed, but only abnormal results are displayed) Labs Reviewed - No data to display   EKG     RADIOLOGY     PROCEDURES:  Critical Care performed:   .Epistaxis Management  Date/Time: 01/21/2022 9:32 AM  Performed by: Lavonia Drafts, MD Authorized by: Lavonia Drafts, MD   Consent:    Consent obtained:  Verbal   Consent given by:  Patient Anesthesia:    Anesthesia method:  Topical application   Topical anesthetic:  Lidocaine gel Procedure details:    Treatment site:  R anterior   Treatment method:  Merocel sponge   Treatment episode: recurring   Post-procedure details:    Assessment:  Bleeding  stopped   Procedure completion:  Tolerated well, no immediate complications    MEDICATIONS ORDERED IN ED: Medications  tranexamic acid (CYKLOKAPRON) 1000 MG/10ML topical solution 500 mg (500 mg Topical Given by Other 01/21/22 0900)  lidocaine (XYLOCAINE) 2 % viscous mouth solution 15 mL (15 mLs Mouth/Throat Given by Other 01/21/22 0900)     IMPRESSION / MDM / [place] / ED COURSE  I reviewed the triage vital signs and the nursing notes. Patient's presentation is most consistent with acute, uncomplicated illness.  Patient presents with epistaxis, no trauma, reviewed labs from prior visits, normal platelets.  She is on Plavix.  Mildly hypertensive.  Bleeding not controlled with nasal clamp, inserted cotton balls soaked in lidocaine and some TXA with only brief resolution of bleeding, decision made to insert Merisel which resolved bleeding   Antibiotics provided, follow-up with ENT, patient agrees with this plan      FINAL CLINICAL IMPRESSION(S) / ED DIAGNOSES   Final diagnoses:  Right-sided epistaxis     Rx / DC  Orders   ED Discharge Orders          Ordered    cephALEXin (KEFLEX) 500 MG capsule  2 times daily        01/21/22 0943             Note:  This document was prepared using Dragon voice recognition software and may include unintentional dictation errors.   Lavonia Drafts, MD 01/21/22 1228

## 2022-01-21 NOTE — Telephone Encounter (Signed)
Patient called answering service this AM complaining of an ongoing nose bleed. She woke up this morning around 6 AM and noticed dried blood around her nostrils. She blew her nose, and she started to have an active nose bleed from the right nostril. Blood was coming out as a constant stream. She applied pressure, and could feel the blood running down the back of her throat. Eventually, she started to have bleeding from both nostrils. Describes it as "gushing blood". She held a cloth to her nose and it was saturated with blood within 30 minutes. The amount of bleeding has not decreased since the nosebleed started. Has been ongoing for about an hour and a half.   As patient has had an ongoing nose bleed for over an hour and a half, bleeding is coming from both nostrils in a constant stream, and as she is on plavix (blood thinner), I recommended that she go to the ER for further evaluation. Patient agreed.   Margie Billet, PA-C 01/21/2022 7:48 AM

## 2022-01-21 NOTE — ED Triage Notes (Signed)
Pt via POV from home. Pt c/o epistaxis since this AM. Denies ever having a nose bleed like this before. Pt states she is on thinners and does have a hx of HTN. This RN gave 1 spray of Afrin and placed nasal clip. Bleeding controlled at this time.

## 2022-01-24 NOTE — Progress Notes (Unsigned)
Cardiology Clinic Note   Patient Name: Sonya Harmon Date of Encounter: 01/26/2022  Primary Care Provider:  Leonides Sake, MD Primary Cardiologist:  Ida Rogue, MD  Patient Profile    51 year old female with a past medical history of coronary disease, peripheral arterial disease, history of tongue cancer, tobacco use, alcohol abuse, and hyperlipidemia, who presents today for follow-up on her coronary artery disease.  Past Medical History    Past Medical History:  Diagnosis Date   Alcohol abuse    Coronary artery disease 09/2021   s/p PCI to RCA in setting of unstable angina   History of tongue cancer    PAD (peripheral artery disease) (HCC)    Tobacco abuse    Past Surgical History:  Procedure Laterality Date   ABDOMINAL AORTOGRAM W/LOWER EXTREMITY N/A 10/15/2021   Procedure: ABDOMINAL AORTOGRAM W/LOWER EXTREMITY;  Surgeon: Wellington Hampshire, MD;  Location: [place] [facility];  Service: Cardiovascular;  Laterality: N/A;   BILATERAL SALPINGECTOMY     CORONARY BALLOON ANGIOPLASTY N/A 09/08/2021   Procedure: CORONARY BALLOON ANGIOPLASTY - ABORTED;  Surgeon: Wellington Hampshire, MD;  Location: [place] [facility];  Service: Cardiovascular;  Laterality: N/A;   CORONARY STENT INTERVENTION N/A 09/10/2021   Procedure: CORONARY STENT INTERVENTION;  Surgeon: Wellington Hampshire, MD;  Location: [place] [facility];  Service: Cardiovascular;  Laterality: N/A;   ESOPHAGOGASTRODUODENOSCOPY (EGD) WITH PROPOFOL N/A 11/01/2021   Procedure: ESOPHAGOGASTRODUODENOSCOPY (EGD) WITH PROPOFOL;  Surgeon: Lin Landsman, MD;  Location: [facility];  Service: Gastroenterology;  Laterality: N/A;   LEFT HEART CATH AND CORONARY ANGIOGRAPHY N/A 09/08/2021   Procedure: LEFT HEART CATH AND CORONARY ANGIOGRAPHY;  Surgeon: Dionisio David, MD;  Location: [place] [facility];  Service: Cardiovascular;  Laterality: N/A;   PERIPHERAL VASCULAR INTERVENTION  10/15/2021   Procedure: PERIPHERAL VASCULAR  INTERVENTION;  Surgeon: Wellington Hampshire, MD;  Location: [place] [facility];  Service: Cardiovascular;;   TONGUE SURGERY      Allergies  Allergies  Allergen Reactions   Amoxicill-Clarithro-Lansopraz Nausea And Vomiting   Amoxicillin Rash    History of Present Illness    Sonya Harmon. Bady is a 51 year old female with a history of coronary artery disease status post PCI to the RCA in 09/2021, PAD status post stenting of the left external iliac artery with drug-coated balloon angioplasty to the distal left common iliac artery, tongue cancer, hyperlipidemia, tobacco use, alcohol use, and had syncopal episode related to GI bleed and was hospitalized.  She was admitted to the hospital 09/2020 with unstable angina and cardiac cath by Dr. Humphrey Rolls from the right femoral artery which showed severe mid RCA stenosis.  PCI was planned, but the patient had large groin hematoma.  She was noted to have significant bilateral iliac disease as well.  She underwent staged PCI via the right radial artery.  She was seen on 10/10/2021 for syncopal episode.  She was evaluated in the emergency department and was noted to have negative troponins x2, D-dimer that was normal, and she was given a liter of fluid and discharged home.  Labs at that time showed stable anemia with a hemoglobin of 10.7.  She was noted to have significant bilateral lower extremity claudication and not with her iliac disease.  She underwent lower extremity angiography on 10/15/2021 which showed severely calcified aorta and no obstructive disease.  There was severe ostial left renal artery stenosis, no significant iliac disease on the right side, and severe left external  iliac artery stenosis extending to the distal left common iliac artery.  She underwent successful DES placement on the left external iliac artery with drug-coated balloon angioplasty to the distal left common iliac artery.  A stent was not placed in an effort to avoid jailing of the internal  iliac artery which was severely diseased at the ostium.  Postprocedure duplex showed abnormal iliac velocities and her claudication had resolved.  She was last seen in clinic on 11/24/2021 for posthospitalization visit was accompanied by her daughter.  She denied any chest pain shortness of breath or claudication symptoms on that day.  She had been unable to continue her PPI therapy as they were continued had to wait for prior authorization on her omeprazole.  She was also requesting medications for smoking cessation and alcohol cessation during her visit.  At her visit she was started on Wellbutrin.  She presented to the [facility] emergency department on 01/21/2022 for epistaxis.  She had stated that she had woken up with her right nare bleeding and been holding pressure with little success.  Initial vitals were blood pressure 138/94, pulse of 74, respirations of 18, temperature 98.4.  She had topical application of lidocaine gel and a Merocel sponge that was placed which stopped the bleeding.  She was given antibiotics and recommended to follow-up with ENT and was subsequently discharged home.  She returns to clinic today not feeling well and has been in tears.  She is complaining of left shoulder pain.  And she just saw her PCP this morning who had of her gabapentin and started her on a muscle relaxer and asked that she also inquire about having her statin decreased for a trial to determine if some of her discomfort is coming from high intensity statin therapy.  Unfortunately at this time she was unable to undergo MRI without 6 weeks of pain management.  She also send continues with epistaxis with her right nare continued to be packed with the packing to be removed at 4 PM today.  She denies any chest pain, shortness of breath, dizziness, lightheadedness.  Home Medications    Current Outpatient Medications  Medication Sig Dispense Refill   buPROPion (WELLBUTRIN SR) 150 MG 12 hr tablet Take 1 tablet (150 mg  total) by mouth 2 (two) times daily. Take 1 tablet daily for the first three days. Then take 1 tablet twice daily. 180 tablet 3   cephALEXin (KEFLEX) 500 MG capsule Take 1 capsule (500 mg total) by mouth 2 (two) times daily. 14 capsule 0   clopidogrel (PLAVIX) 75 MG tablet Take 1 tablet (75 mg total) by mouth daily. 30 tablet 11   ferrous sulfate 325 (65 FE) MG tablet Take 1 tablet (325 mg total) by mouth daily. 30 tablet 0   metoprolol tartrate (LOPRESSOR) 25 MG tablet Take 0.5 tablets (12.5 mg total) by mouth 2 (two) times daily. 90 tablet 3   atorvastatin (LIPITOR) 40 MG tablet Take 1 tablet (40 mg total) by mouth daily. 90 tablet 3   omeprazole (PRILOSEC) 40 MG capsule Take 1 capsule (40 mg total) by mouth daily. 90 capsule 3   No current facility-administered medications for this visit.     Family History    Family History  Problem Relation Age of Onset   Cancer Mother    Cancer Father    Stroke Sister    Aneurysm Sister    She indicated that her mother is deceased. She indicated that her father is deceased. She indicated that  her sister is alive.  Social History    Social History   Socioeconomic History   Marital status: Married    Spouse name: Not on file   Number of children: Not on file   Years of education: Not on file   Highest education level: Not on file  Occupational History   Not on file  Tobacco Use   Smoking status: Former    Packs/day: 0.50    Types: Cigarettes    Quit date: 09/07/2021    Years since quitting: 0.3   Smokeless tobacco: Former  Scientific laboratory technician Use: Former  Substance and Sexual Activity   Alcohol use: Not Currently    Alcohol/week: 42.0 standard drinks of alcohol    Types: 42 Cans of beer per week   Drug use: Not Currently    Types: Marijuana   Sexual activity: Yes    Partners: Male  Other Topics Concern   Not on file  Social History Narrative   ** Merged History Encounter **       Social Determinants of Health   Financial  Resource Strain: Not on file  Food Insecurity: Not on file  Transportation Needs: Not on file  Physical Activity: Not on file  Stress: Not on file  Social Connections: Not on file  Intimate Partner Violence: Not on file     Review of Systems    General:  No chills, fever, night sweats or weight changes.  HEENT: Positive for a nosebleed from the right nare Cardiovascular:  No chest pain, dyspnea on exertion, edema, orthopnea, palpitations, paroxysmal nocturnal dyspnea. Dermatological: No rash, lesions/masses Respiratory: No cough, dyspnea Urologic: No hematuria, dysuria Abdominal:   No nausea, vomiting, diarrhea, bright red blood per rectum, melena, or hematemesis Musculoskeletal: Positive for left shoulder pain that radiates down her arms with numbness and tingling Neurologic:  No visual changes, wkns, changes in mental status. All other systems reviewed and are otherwise negative except as noted above.   Physical Exam    VS:  BP 110/78 (BP Location: Right Arm, Patient Position: Sitting, Cuff Size: Normal)   Pulse 75   Ht '5\' 2"'$  (1.575 m)   Wt 106 lb (48.1 kg)   SpO2 99%   BMI 19.39 kg/m  , BMI Body mass index is 19.39 kg/m.     GEN: Well nourished, well developed, in no acute distress. HEENT: normal.  Right nare remains packed with old blood noted Neck: Supple, no JVD, carotid bruits, or masses. Cardiac: RRR, no murmurs, rubs, or gallops. No clubbing, cyanosis, edema.  Radials/DP/PT 2+ and equal bilaterally.  Respiratory:  Respirations regular and unlabored, clear to auscultation bilaterally. GI: Soft, nontender, nondistended, BS + x 4. MS: no deformity or atrophy.  Left shoulder tenderness to mild palpation Skin: warm and dry, no rash. Neuro:  Strength and sensation are intact. Psych: Normal affect.  Accessory Clinical Findings    ECG personally reviewed by me today-no new tracings for today  Lab Results  Component Value Date   WBC 3.3 (L) 11/02/2021   HGB 8.4 (L)  11/02/2021   HCT 25.3 (L) 11/02/2021   MCV 94.8 11/02/2021   PLT 211 11/02/2021   Lab Results  Component Value Date   CREATININE 0.62 11/02/2021   BUN 6 11/02/2021   NA 133 (L) 11/02/2021   K 3.4 (L) 11/02/2021   CL 106 11/02/2021   CO2 23 11/02/2021   Lab Results  Component Value Date   ALT 18 10/30/2021  AST 34 10/30/2021   ALKPHOS 53 10/30/2021   BILITOT 0.6 10/30/2021   Lab Results  Component Value Date   CHOL 177 09/09/2021   HDL 95 09/09/2021   LDLCALC 76 09/09/2021   TRIG 30 09/09/2021   CHOLHDL 1.9 09/09/2021    No results found for: "HGBA1C"  Assessment & Plan   1.  Coronary artery disease involving native coronary arteries without angina with a history of stent to the RCA.  No EKGs were completed today.  She denies any angina or anginal equivalents at this time.  She has been continued on Plavix 75 mg daily, atorvastatin which she is holding for 3 days then restarting and a half of a dose, and continued on metoprolol tartrate 12.5 mg twice daily.   2.  Left shoulder pain that has been constant and reproducible to mild palpation that does run down the length of her arm which causes a numbness and tingling feeling in her left hand.  We will try to decrease her statin therapy at this time to see if her statin is causing the discomfort at the recommendation of her PCP to her today.  She has also had medication changes made today by her PCP and is awaiting the timeframe where she can have MRI.  3.  Peripheral arterial disease status post recent iliac stenting.  Duplex that shows normal velocities.  She denies any symptoms of claudication today.  She has ABIs scheduled a few months out.  She has been continued on clopidogrel at this time.  4.  Hyperlipidemia with LDL of 76.  Atorvastatin is being held for 3 days and restarted at 40 mg daily to see if that helps with the left shoulder plain that she has been complaining of.  5.  Epistaxis with packing to the right nare.   She required blood transfusion in the hospital and has had little large amount of bleeding from the right nostril.  She has been sent for CBC today to follow-up on her anemia after transfusion and discharge from the hospital her hemoglobin was 8.4.  6.  Recent GI bleed where insurance still denied PPI therapy due to dosing.  Starting patient on Prilosec 40 mg daily with hopes of insurance approval.  She is continue to keep follow-up with GI.  She is also denies any more dizziness, syncope, or noting any blood in her stool or urine.  7.  Tobacco abuse which she continues to be smoking in small amounts.  Currently continues on Wellbutrin and continues to work on his smoking cessation as it is gradually been decreasing.  8.  Disposition patient return to clinic to see MD/APP in 1 month or sooner if needed. Daryl Quiros, NP 01/26/2022, 2:04 PM

## 2022-01-26 ENCOUNTER — Encounter: Payer: Self-pay | Admitting: Cardiology

## 2022-01-26 ENCOUNTER — Ambulatory Visit: Payer: Commercial Managed Care - HMO | Attending: Cardiology | Admitting: Cardiology

## 2022-01-26 ENCOUNTER — Other Ambulatory Visit
Admission: RE | Admit: 2022-01-26 | Discharge: 2022-01-26 | Disposition: A | Discharge status: Home / Self Care | Payer: Commercial Managed Care - HMO | Source: Ambulatory Visit | Attending: Cardiology | Admitting: Cardiology

## 2022-01-26 VITALS — BP 110/78 | HR 75 | Ht 62.0 in | Wt 106.0 lb

## 2022-01-26 DIAGNOSIS — Z720 Tobacco use: Secondary | ICD-10-CM | POA: Diagnosis not present

## 2022-01-26 DIAGNOSIS — E785 Hyperlipidemia, unspecified: Secondary | ICD-10-CM | POA: Diagnosis not present

## 2022-01-26 DIAGNOSIS — I2510 Atherosclerotic heart disease of native coronary artery without angina pectoris: Secondary | ICD-10-CM

## 2022-01-26 DIAGNOSIS — I739 Peripheral vascular disease, unspecified: Secondary | ICD-10-CM

## 2022-01-26 DIAGNOSIS — M25512 Pain in left shoulder: Secondary | ICD-10-CM

## 2022-01-26 DIAGNOSIS — R040 Epistaxis: Secondary | ICD-10-CM

## 2022-01-26 LAB — CBC
HCT: 33.5 % — ABNORMAL LOW (ref 36.0–46.0)
Hemoglobin: 11.2 g/dL — ABNORMAL LOW (ref 12.0–15.0)
MCH: 31.6 pg (ref 26.0–34.0)
MCHC: 33.4 g/dL (ref 30.0–36.0)
MCV: 94.6 fL (ref 80.0–100.0)
Platelets: 384 10*3/uL (ref 150–400)
RBC: 3.54 MIL/uL — ABNORMAL LOW (ref 3.87–5.11)
RDW: 15.5 % (ref 11.5–15.5)
WBC: 11.5 10*3/uL — ABNORMAL HIGH (ref 4.0–10.5)
nRBC: 0 % (ref 0.0–0.2)

## 2022-01-26 MED ORDER — ATORVASTATIN CALCIUM 40 MG PO TABS: 40.0000 mg | ORAL_TABLET | Freq: Every day | ORAL | 3 refills | Status: DC

## 2022-01-26 MED ORDER — OMEPRAZOLE 40 MG PO CPDR: 40.0000 mg | DELAYED_RELEASE_CAPSULE | Freq: Every day | ORAL | 3 refills | Status: DC

## 2022-01-26 NOTE — Patient Instructions (Signed)
Medication Instructions:   STOP Atorvastatin for 3 days -- resume at a DECREASED dose of '40mg'$  daily.  START Omeprazole - take one tablet by mouth daily.    *If you need a refill on your cardiac medications before your next appointment, please call your pharmacy*   Lab Work:  Your physician recommends you go to the medical mall for lab work today - CBC  If you have labs (blood work) drawn today and your tests are completely normal, you will receive your results only by: MyChart Message (if you have MyChart) OR A paper copy in the mail If you have any lab test that is abnormal or we need to change your treatment, we will call you to review the results.   Testing/Procedures:  None Ordered   Follow-Up: At [facility], you and your health needs are our priority.  As part of our continuing mission to provide you with exceptional heart care, we have created designated Provider Care Teams.  These Care Teams include your primary Cardiologist (physician) and Advanced Practice Providers (APPs -  Physician Assistants and Nurse Practitioners) who all work together to provide you with the care you need, when you need it.  We recommend signing up for the patient portal called "MyChart".  Sign up information is provided on this After Visit Summary.  MyChart is used to connect with patients for Virtual Visits (Telemedicine).  Patients are able to view lab/test results, encounter notes, upcoming appointments, etc.  Non-urgent messages can be sent to your provider as well.   To learn more about what you can do with MyChart, go to [email or web address].    Your next appointment:   1 month(s)  The format for your next appointment:   In Person  Provider:   You may see Ida Rogue, MD or one of the following Advanced Practice Providers on your designated Care Team:   Murray Hodgkins, NP Christell Faith, PA-C Cadence Kathlen Mody, PA-C Gerrie Nordmann, NP

## 2022-01-27 ENCOUNTER — Other Ambulatory Visit: Payer: Self-pay | Admitting: Physician Assistant

## 2022-01-27 DIAGNOSIS — G5692 Unspecified mononeuropathy of left upper limb: Secondary | ICD-10-CM

## 2022-01-28 ENCOUNTER — Ambulatory Visit: Payer: Commercial Managed Care - HMO

## 2022-01-28 ENCOUNTER — Ambulatory Visit
Admission: RE | Admit: 2022-01-28 | Discharge: 2022-01-28 | Disposition: A | Discharge status: Home / Self Care | Payer: Commercial Managed Care - HMO | Source: Ambulatory Visit | Attending: Physician Assistant | Admitting: Physician Assistant

## 2022-01-28 DIAGNOSIS — G5692 Unspecified mononeuropathy of left upper limb: Secondary | ICD-10-CM | POA: Diagnosis present

## 2022-01-30 ENCOUNTER — Other Ambulatory Visit: Payer: Self-pay

## 2022-01-30 MED ORDER — ATORVASTATIN CALCIUM 40 MG PO TABS: 40.0000 mg | ORAL_TABLET | Freq: Every day | ORAL | 3 refills | Status: DC

## 2022-01-30 MED ORDER — METOPROLOL TARTRATE 25 MG PO TABS: 12.5000 mg | ORAL_TABLET | Freq: Two times a day (BID) | ORAL | 3 refills | Status: DC

## 2022-01-30 MED ORDER — CLOPIDOGREL BISULFATE 75 MG PO TABS: 75.0000 mg | ORAL_TABLET | Freq: Every day | ORAL | 3 refills | Status: DC

## 2022-02-04 ENCOUNTER — Other Ambulatory Visit: Payer: Self-pay

## 2022-02-04 MED ORDER — OMEPRAZOLE 40 MG PO CPDR: 40.0000 mg | DELAYED_RELEASE_CAPSULE | Freq: Every day | ORAL | 3 refills | Status: DC

## 2022-02-09 ENCOUNTER — Ambulatory Visit: Payer: Self-pay | Admitting: Physician Assistant

## 2022-02-19 ENCOUNTER — Other Ambulatory Visit: Payer: Self-pay

## 2022-02-23 ENCOUNTER — Telehealth: Payer: Self-pay

## 2022-02-23 ENCOUNTER — Encounter: Payer: Self-pay | Admitting: Gastroenterology

## 2022-02-23 ENCOUNTER — Other Ambulatory Visit: Payer: Self-pay

## 2022-02-23 ENCOUNTER — Ambulatory Visit (INDEPENDENT_AMBULATORY_CARE_PROVIDER_SITE_OTHER): Payer: Commercial Managed Care - HMO | Admitting: Gastroenterology

## 2022-02-23 VITALS — BP 121/82 | HR 74 | Temp 98.1°F | Ht 62.0 in | Wt 109.0 lb

## 2022-02-23 DIAGNOSIS — K253 Acute gastric ulcer without hemorrhage or perforation: Secondary | ICD-10-CM

## 2022-02-23 DIAGNOSIS — K279 Peptic ulcer, site unspecified, unspecified as acute or chronic, without hemorrhage or perforation: Secondary | ICD-10-CM

## 2022-02-23 DIAGNOSIS — Z1211 Encounter for screening for malignant neoplasm of colon: Secondary | ICD-10-CM

## 2022-02-23 DIAGNOSIS — D500 Iron deficiency anemia secondary to blood loss (chronic): Secondary | ICD-10-CM | POA: Diagnosis not present

## 2022-02-23 MED ORDER — NA SULFATE-K SULFATE-MG SULF 17.5-3.13-1.6 GM/177ML PO SOLN: 354.0000 mL | Freq: Once | ORAL | 0 refills | Status: AC

## 2022-02-23 NOTE — Progress Notes (Signed)
[name], MD [address]  [place]  [place], [place] [address]  Main: [phone]  Fax: [phone]    Gastroenterology Consultation  Referring Provider:     Leonides Sake, MD Primary Care Physician:  Leonides Sake, MD Primary Gastroenterologist:  Dr. [name] Reason for Consultation: Follow-up of peptic ulcer disease        HPI:   Sonya Harmon is a 51 y.o. female referred by Dr. Leonides Sake, MD  for consultation & management of gastric ulcer.  Patient was admitted to [facility] end of July 2023 secondary to acute blood loss anemia.  She has history of heavy tobacco use, alcohol use, coronary disease, peripheral artery disease, PCI to RCA in 09/2021, left iliac artery angioplasty in 10/2021 on DAPT.  She was found to have multiple nonbleeding gastric ulcers based on the upper endoscopy on 11/01/2021.  She has been started on Prilosec 40 mg twice daily, currently on once a day.  She is no longer experiencing any symptoms of active GI bleeding.  She reports having an episode episode of epistaxis that occurred on 10/18, went to the ER, had a nasal packing.  She denies any further episodes since then Patient's anemia has significantly improved, she took oral iron supplements for 1 month.  She continues to smoke tobacco   NSAIDs: None  Antiplts/Anticoagulants/Anti thrombotics: DAPT  GI Procedures:  Upper endoscopy 4/94/4967 - Alcoholic duodenitis. - Normal second portion of the duodenum. - Non-obstructing non-bleeding gastric ulcers with a clean ulcer base (Forrest Class III). NSAID induced etiology. There is no evidence of perforation. Biopsied. - Small hiatal hernia. - Normal gastroesophageal junction and esophagus.  Past Medical History:  Diagnosis Date   Alcohol abuse    Coronary artery disease 09/2021   s/p PCI to RCA in setting of unstable angina   History of tongue cancer    PAD (peripheral artery disease) (HCC)    Tobacco abuse     Past  Surgical History:  Procedure Laterality Date   ABDOMINAL AORTOGRAM W/LOWER EXTREMITY N/A 10/15/2021   Procedure: ABDOMINAL AORTOGRAM W/LOWER EXTREMITY;  Surgeon: Wellington Hampshire, MD;  Location: [place] [facility];  Service: Cardiovascular;  Laterality: N/A;   BILATERAL SALPINGECTOMY     CORONARY BALLOON ANGIOPLASTY N/A 09/08/2021   Procedure: CORONARY BALLOON ANGIOPLASTY - ABORTED;  Surgeon: Wellington Hampshire, MD;  Location: [place] [facility];  Service: Cardiovascular;  Laterality: N/A;   CORONARY STENT INTERVENTION N/A 09/10/2021   Procedure: CORONARY STENT INTERVENTION;  Surgeon: Wellington Hampshire, MD;  Location: [place] [facility];  Service: Cardiovascular;  Laterality: N/A;   ESOPHAGOGASTRODUODENOSCOPY (EGD) WITH PROPOFOL N/A 11/01/2021   Procedure: ESOPHAGOGASTRODUODENOSCOPY (EGD) WITH PROPOFOL;  Surgeon: Lin Landsman, MD;  Location: [facility]OSCOPY;  Service: Gastroenterology;  Laterality: N/A;   LEFT HEART CATH AND CORONARY ANGIOGRAPHY N/A 09/08/2021   Procedure: LEFT HEART CATH AND CORONARY ANGIOGRAPHY;  Surgeon: Dionisio David, MD;  Location: [place] [facility];  Service: Cardiovascular;  Laterality: N/A;   PERIPHERAL VASCULAR INTERVENTION  10/15/2021   Procedure: PERIPHERAL VASCULAR INTERVENTION;  Surgeon: Wellington Hampshire, MD;  Location: [place] [facility];  Service: Cardiovascular;;   TONGUE SURGERY       Current Outpatient Medications:    atorvastatin (LIPITOR) 80 MG tablet, Take 80 mg by mouth daily., Disp: , Rfl:    baclofen (LIORESAL) 10 MG tablet, Take 10 mg by mouth 3 (three) times daily., Disp: , Rfl:  buPROPion (WELLBUTRIN SR) 150 MG 12 hr tablet, Take 1 tablet (150 mg total) by mouth 2 (two) times daily. Take 1 tablet daily for the first three days. Then take 1 tablet twice daily., Disp: 180 tablet, Rfl: 3   clopidogrel (PLAVIX) 75 MG tablet, Take 1 tablet (75 mg total) by mouth daily., Disp: 90 tablet, Rfl: 3   famotidine (PEPCID) 20 MG tablet, Take 20 mg by  mouth daily., Disp: , Rfl:    gabapentin (NEURONTIN) 300 MG capsule, Take 300 mg by mouth 3 (three) times daily., Disp: , Rfl:    losartan-hydrochlorothiazide (HYZAAR) 50-12.5 MG tablet, Take 1 tablet by mouth daily., Disp: , Rfl:    methocarbamol (ROBAXIN) 750 MG tablet, Take 750 mg by mouth every 6 (six) hours as needed for muscle spasms., Disp: , Rfl:    metoprolol tartrate (LOPRESSOR) 25 MG tablet, Take 0.5 tablets (12.5 mg total) by mouth 2 (two) times daily., Disp: 90 tablet, Rfl: 3   Na Sulfate-K Sulfate-Mg Sulf 17.5-3.13-1.6 GM/177ML SOLN, Take 354 mLs by mouth once for 1 dose., Disp: 354 mL, Rfl: 0   omeprazole (PRILOSEC) 40 MG capsule, Take 1 capsule (40 mg total) by mouth daily., Disp: 90 capsule, Rfl: 3   pravastatin (PRAVACHOL) 10 MG tablet, Take 10 mg by mouth daily., Disp: , Rfl:    Family History  Problem Relation Age of Onset   Cancer Mother    Cancer Father    Stroke Sister    Aneurysm Sister      Social History   Tobacco Use   Smoking status: Every Day    Packs/day: 0.50    Types: Cigarettes    Last attempt to quit: 09/07/2021    Years since quitting: 0.4   Smokeless tobacco: Former  Scientific laboratory technician Use: Former  Substance Use Topics   Alcohol use: Not Currently    Alcohol/week: 42.0 standard drinks of alcohol    Types: 42 Cans of beer per week   Drug use: Not Currently    Types: Marijuana    Allergies as of 02/23/2022 - Review Complete 01/26/2022  Allergen Reaction Noted   Amoxicill-clarithro-lansopraz Nausea And Vomiting 11/22/2015   Amoxicillin Rash 09/07/2021    Review of Systems:    All systems reviewed and negative except where noted in HPI.   Physical Exam:  BP 121/82 (BP Location: Left Arm, Patient Position: Sitting, Cuff Size: Normal)   Pulse 74   Temp 98.1 F (36.7 C) (Oral)   Ht '5\' 2"'$  (1.575 m)   Wt 109 lb (49.4 kg)   BMI 19.94 kg/m  No LMP recorded. Patient is postmenopausal.  General:   Alert,  Well-developed, well-nourished,  pleasant and cooperative in NAD Head:  Normocephalic and atraumatic. Eyes:  Sclera clear, no icterus.   Conjunctiva pink. Ears:  Normal auditory acuity. Nose:  No deformity, discharge, or lesions. Mouth:  No deformity or lesions,oropharynx pink & moist. Neck:  Supple; no masses or thyromegaly. Lungs:  Respirations even and unlabored.  Clear throughout to auscultation.   No wheezes, crackles, or rhonchi. No acute distress. Heart:  Regular rate and rhythm; no murmurs, clicks, rubs, or gallops. Abdomen:  Normal bowel sounds. Soft, non-tender and non-distended without masses, hepatosplenomegaly or hernias noted.  No guarding or rebound tenderness.   Rectal: Not performed Msk:  Symmetrical without gross deformities. Good, equal movement & strength bilaterally. Pulses:  Normal pulses noted. Extremities:  No clubbing or edema.  No cyanosis. Neurologic:  Alert and oriented x3;  grossly normal neurologically. Skin:  Intact without significant lesions or rashes. No jaundice. Psych:  Alert and cooperative. Normal mood and affect.  Imaging Studies: Reviewed  Assessment and Plan:   Sonya Harmon is a 51 y.o. female with history of coronary disease, peripheral artery disease, s/p PCI on DAPT is seen in consultation for hospital follow-up of acute upper GI bleed secondary to peptic ulcer disease.  There is no evidence of H. pylori based on gastric biopsies.  Patient is no longer bleeding Continue Prilosec 40 mg daily Recommend repeat EGD to confirm healing of gastric ulcers Check iron panel, B12 and folate levels given history of iron and B12 deficiency anemia  Recommend screening colonoscopy and patient is agreeable   Follow up as needed   [name], MD

## 2022-02-23 NOTE — Telephone Encounter (Signed)
Team, patient has upcoming appointment with cardiology.  Please add preoperative cardiac evaluation to upcoming appointment note.  I will defer preoperative cardiac evaluation to this at that time.  Thank you for your help.  Jossie Ng. Dellanira Dillow NP-C     02/23/2022, 2:15 PM [address] [place] [address] Office [phone] Fax [phone]

## 2022-02-23 NOTE — Telephone Encounter (Signed)
   [facility] Pre-operative Risk Assessment    Request for surgical clearance:  What type of surgery is being performed? Colonoscopy and EGD    When is this surgery scheduled? 03/11/2022   Are there any medications that need to be held prior to surgery and how long?Plavix 1m   [facility]  Dr. VMarius Ditch What is your office phone and fax number? [phone]  Anesthesia type (None, local, MAC, general) ? General   AShelby Mattocks11/20/2023, 1:42 PM  _________________________________________________________________   (provider comments below)

## 2022-02-24 LAB — IRON,TIBC AND FERRITIN PANEL
Ferritin: 72 ng/mL (ref 15–150)
Iron Saturation: 13 % — ABNORMAL LOW (ref 15–55)
Iron: 48 ug/dL (ref 27–159)
Total Iron Binding Capacity: 358 ug/dL (ref 250–450)
UIBC: 310 ug/dL (ref 131–425)

## 2022-03-02 NOTE — Progress Notes (Unsigned)
Cardiology Clinic Note   Patient Name: Sonya Harmon Date of Encounter: 03/03/2022  Primary Care Provider:  Leonides Sake, MD Primary Cardiologist:  Ida Rogue, MD  Patient Profile    51 year old female with a past medical history of coronary artery disease, peripheral arterial disease, history of tongue cancer, tobacco use/alcohol abuse, and hyperlipidemia, who presents today for follow-up.  Past Medical History    Past Medical History:  Diagnosis Date   Alcohol abuse    Coronary artery disease 09/2021   s/p PCI to RCA in setting of unstable angina   History of tongue cancer    PAD (peripheral artery disease) (HCC)    Tobacco abuse    Past Surgical History:  Procedure Laterality Date   ABDOMINAL AORTOGRAM W/LOWER EXTREMITY N/A 10/15/2021   Procedure: ABDOMINAL AORTOGRAM W/LOWER EXTREMITY;  Surgeon: Wellington Hampshire, MD;  Location: [place] [facility];  Service: Cardiovascular;  Laterality: N/A;   BILATERAL SALPINGECTOMY     CORONARY BALLOON ANGIOPLASTY N/A 09/08/2021   Procedure: CORONARY BALLOON ANGIOPLASTY - ABORTED;  Surgeon: Wellington Hampshire, MD;  Location: [place] [facility];  Service: Cardiovascular;  Laterality: N/A;   CORONARY STENT INTERVENTION N/A 09/10/2021   Procedure: CORONARY STENT INTERVENTION;  Surgeon: Wellington Hampshire, MD;  Location: [place] [facility];  Service: Cardiovascular;  Laterality: N/A;   ESOPHAGOGASTRODUODENOSCOPY (EGD) WITH PROPOFOL N/A 11/01/2021   Procedure: ESOPHAGOGASTRODUODENOSCOPY (EGD) WITH PROPOFOL;  Surgeon: Lin Landsman, MD;  Location: [facility] ENDOSCOPY;  Service: Gastroenterology;  Laterality: N/A;   LEFT HEART CATH AND CORONARY ANGIOGRAPHY N/A 09/08/2021   Procedure: LEFT HEART CATH AND CORONARY ANGIOGRAPHY;  Surgeon: Dionisio David, MD;  Location: [place] [facility];  Service: Cardiovascular;  Laterality: N/A;   PERIPHERAL VASCULAR INTERVENTION  10/15/2021   Procedure: PERIPHERAL VASCULAR INTERVENTION;  Surgeon:  Wellington Hampshire, MD;  Location: [place] [facility];  Service: Cardiovascular;;   TONGUE SURGERY      Allergies  Allergies  Allergen Reactions   Amoxicill-Clarithro-Lansopraz Nausea And Vomiting   Amoxicillin Rash    History of Present Illness    Sonya Harmon is a 51 year old female with a history of coronary artery disease status post PCI to the RCA in 09/2021, peripheral arterial disease status post stenting of the left external iliac artery with drug-coated balloon and angioplasty to the distal left common iliac artery, tongue cancer, hyperlipidemia, tobacco/alcohol use, and recent GI bleed that led to hospitalization and syncopal episode.  She was originally hospitalized in 09/2020 with unstable angina and cardiac cath completed by Dr. Evette Georges from the right femoral artery which showed severe mid RCA stenosis.  PCI was planned but the patient had a large groin hematoma.  She did undergo staged PCI via the right radial artery as well as was noted to have significant bilateral iliac disease as well.  On 10/10/2021 she was seen for syncopal episode.  Evaluated in the emergency department at that time.  She underwent a lower extremity angiography on 10/15/2021 which showed severely calcified aorta no obstructive disease.  She underwent successful DES placement to the left external iliac artery with drug-coated balloon angioplasty to the distal left common iliac artery.  A stent was not placed in an effort to avoid jailing of the internal iliac artery which was severely diseased at the ostium.  Postprocedure duplex revealed abnormal iliac velocities and her claudication had resolved.  She then presented to the [facility] emergency department on 01/21/2022 for epistaxis.  She  stated that she had woken up with the right nare bleeding and been holding pressure with little success.  She had topical application of lidocaine gel and a Merocel sponge that was placed which stopped the bleeding.  She was placed on  antibiotics and recommended to follow-up with ENT.  She was last seen in clinic in 01/26/2022 and continued to have the packing to the right in a year from her recent nosebleed.  She was sent for a follow-up blood work as she was anemic at the time of the epistaxis.  They did continue her on her clopidogrel.  At the time of her appointment she was complaining of left shoulder plain that have been constant and reproducible to mild palpation she had followed up with her PCP who changed her medications around and she was awaiting the timeframe that she had optimize medical therapy to she can have her MRI completed.Marland Kitchen  Unfortunately with her recent GI bleed her insurance continues to deny her PPI therapy due to dosing as they want her to take Protonix 40 mg twice daily.  So she has been without any PPI therapy since that discharged at her appointment she was prescribed Prilosec 40 mg daily.  She returns to clinic today stating that overall she has been doing fairly well.  She has not had any further nosebleeds, GI bleeds, or syncopal events.  She continues to deny chest pain, shortness of breath, peripheral edema, or claudication.  She has been scheduled for a endoscopy and a colonoscopy.  She continues to have left shoulder discomfort and has found out recently that she has a herniated disc in her neck that is causing her complaints.  She has tolerated her other medications without difficulty.  She states that her Wellbutrin has helped with everything except for smoking cessation.  She has increased the amount that she has been smoking as of late she states related to stress and being able to talk herself into smoking versus not smoking.  She denies any recent hospitalizations or visits to the emergency department.  Home Medications    Current Outpatient Medications  Medication Sig Dispense Refill   baclofen (LIORESAL) 10 MG tablet Take 10 mg by mouth 3 (three) times daily.     buPROPion (WELLBUTRIN SR) 150  MG 12 hr tablet Take 1 tablet (150 mg total) by mouth 2 (two) times daily. Take 1 tablet daily for the first three days. Then take 1 tablet twice daily. 180 tablet 3   clopidogrel (PLAVIX) 75 MG tablet Take 1 tablet (75 mg total) by mouth daily. 90 tablet 3   famotidine (PEPCID) 20 MG tablet Take 20 mg by mouth daily.     gabapentin (NEURONTIN) 300 MG capsule Take 300 mg by mouth 3 (three) times daily.     losartan-hydrochlorothiazide (HYZAAR) 50-12.5 MG tablet Take 1 tablet by mouth daily.     methocarbamol (ROBAXIN) 750 MG tablet Take 750 mg by mouth every 6 (six) hours as needed for muscle spasms.     metoprolol tartrate (LOPRESSOR) 25 MG tablet Take 0.5 tablets (12.5 mg total) by mouth 2 (two) times daily. 90 tablet 3   omeprazole (PRILOSEC) 40 MG capsule Take 1 capsule (40 mg total) by mouth daily. 90 capsule 3   atorvastatin (LIPITOR) 80 MG tablet Take 1 tablet (80 mg total) by mouth daily. 90 tablet 3   No current facility-administered medications for this visit.     Family History    Family History  Problem Relation Age  of Onset   Cancer Mother    Cancer Father    Stroke Sister    Aneurysm Sister    She indicated that her mother is deceased. She indicated that her father is deceased. She indicated that her sister is alive.  Social History    Social History   Socioeconomic History   Marital status: Married    Spouse name: Not on file   Number of children: Not on file   Years of education: Not on file   Highest education level: Not on file  Occupational History   Not on file  Tobacco Use   Smoking status: Every Day    Packs/day: 0.50    Types: Cigarettes    Last attempt to quit: 09/07/2021    Years since quitting: 0.4   Smokeless tobacco: Former  Scientific laboratory technician Use: Former  Substance and Sexual Activity   Alcohol use: Not Currently    Alcohol/week: 42.0 standard drinks of alcohol    Types: 42 Cans of beer per week   Drug use: Not Currently    Types:  Marijuana   Sexual activity: Yes    Partners: Male  Other Topics Concern   Not on file  Social History Narrative   ** Merged History Encounter **       Social Determinants of Health   Financial Resource Strain: Not on file  Food Insecurity: Not on file  Transportation Needs: Not on file  Physical Activity: Not on file  Stress: Not on file  Social Connections: Not on file  Intimate Partner Violence: Not on file     Review of Systems    General:  No chills, fever, night sweats or weight changes.  Cardiovascular:  No chest pain, dyspnea on exertion, edema, orthopnea, palpitations, paroxysmal nocturnal dyspnea. Dermatological: No rash, lesions/masses Respiratory: No cough, dyspnea Musculoskeletal: She continues to complain of left shoulder and neck pain from recently failed herniated disc. Urologic: No hematuria, dysuria Abdominal:   No nausea, vomiting, diarrhea, bright red blood per rectum, melena, or hematemesis Neurologic:  No visual changes, wkns, changes in mental status. All other systems reviewed and are otherwise negative except as noted above.   Physical Exam    VS:  BP (!) 142/80 (BP Location: Left Arm, Patient Position: Sitting, Cuff Size: Normal)   Pulse 68   Ht '5\' 2"'$  (1.575 m)   Wt 110 lb (49.9 kg)   SpO2 100%   BMI 20.12 kg/m  , BMI Body mass index is 20.12 kg/m.     GEN: Well nourished, well developed, in no acute distress. HEENT: normal.  Glasses on Neck: Supple, no JVD, carotid bruits, or masses. Cardiac: RRR, no murmurs, rubs, or gallops. No clubbing, cyanosis, edema.  Radials/DP/PT 2+ and equal bilaterally.  Respiratory:  Respirations regular and unlabored, clear to auscultation bilaterally. GI: Soft, nontender, nondistended, BS + x 4. MS: no deformity or atrophy.  Left shoulder tenderness to mild palpation. Skin: warm and dry, no rash. Neuro:  Strength and sensation are intact. Psych: Normal affect.  Accessory Clinical Findings    ECG  personally reviewed by me today-no new tracings completed today  Lab Results  Component Value Date   WBC 11.5 (H) 01/26/2022   HGB 11.2 (L) 01/26/2022   HCT 33.5 (L) 01/26/2022   MCV 94.6 01/26/2022   PLT 384 01/26/2022   Lab Results  Component Value Date   CREATININE 0.62 11/02/2021   BUN 6 11/02/2021   NA 133 (L) 11/02/2021  K 3.4 (L) 11/02/2021   CL 106 11/02/2021   CO2 23 11/02/2021   Lab Results  Component Value Date   ALT 18 10/30/2021   AST 34 10/30/2021   ALKPHOS 53 10/30/2021   BILITOT 0.6 10/30/2021   Lab Results  Component Value Date   CHOL 177 09/09/2021   HDL 95 09/09/2021   LDLCALC 76 09/09/2021   TRIG 30 09/09/2021   CHOLHDL 1.9 09/09/2021    No results found for: "HGBA1C"  Assessment & Plan   1.  Coronary artery disease involving native coronary arteries without angina with a history of stent to the RCA.  No EKGs completed today.  She denies any anginal or anginal equivalents.  She is continued on Plavix 75 mg daily and atorvastatin 80 mg daily.  2.  Peripheral arterial disease status post recent iliac stenting.  Duplex that shows normal velocities.  She continues to deny symptoms of claudication.  She is continued on clopidogrel.  3.  Hypertension with blood pressure of 142/80 unfortunately she has yet to have any of her medications today.  She has been continued on losartan HCTZ 50/12.5 mg daily and metoprolol tartrate 12.5 mg twice daily.  4.  Hyperlipidemia with last LDL of 76.  She is continued on atorvastatin 80 mg daily.  She states that she does have blood work that is upcoming from her PCP she will need lipid panel redone at that time.  Previously there were some questions on her medication list that she did have atorvastatin 80 mg daily and pravastatin 10 mg daily according to the patient she is only taking the atorvastatin so the pravastatin will need to be removed from her medication list.  She also requested a refill of her atorvastatin be  sent to pharmacy of choice  5.  She had a recent GI bleed and has been scheduled for a colonoscopy and an endoscopy 03/11/2022.  Hemoglobin had remained stable but she did have low iron and low B12.  There were questioning the appropriateness of how long to hold her clopidogrel.  With continuous bleeding issues it is reasonable to hold her clopidogrel for 5 days with restarting the day after her procedure as long as it is okay with GI and there is not a large quantity of polyps that had to be removed during her procedure.  Since she also does have tongue cancer the recent evaluation after her recent nosebleed there was some unusual findings according to the patient that need reevaluation.  Recommendation is not to delay her procedures and evaluation at this time.  6.  Tobacco abuse which she still continues to increase the amount that she is smoking.  She continues on Wellbutrin and continues to work on smoking cessation.  7.  Disposition patient return to clinic to see MD/APP in 3 months or sooner if needed.  Rhyland Hinderliter, NP 03/03/2022, 9:06 AM

## 2022-03-03 ENCOUNTER — Ambulatory Visit: Payer: Commercial Managed Care - HMO | Attending: Cardiology | Admitting: Cardiology

## 2022-03-03 ENCOUNTER — Encounter: Payer: Self-pay | Admitting: Cardiology

## 2022-03-03 VITALS — BP 142/80 | HR 68 | Ht 62.0 in | Wt 110.0 lb

## 2022-03-03 DIAGNOSIS — I2510 Atherosclerotic heart disease of native coronary artery without angina pectoris: Secondary | ICD-10-CM | POA: Diagnosis not present

## 2022-03-03 DIAGNOSIS — E785 Hyperlipidemia, unspecified: Secondary | ICD-10-CM

## 2022-03-03 DIAGNOSIS — I739 Peripheral vascular disease, unspecified: Secondary | ICD-10-CM | POA: Diagnosis not present

## 2022-03-03 DIAGNOSIS — I10 Essential (primary) hypertension: Secondary | ICD-10-CM

## 2022-03-03 DIAGNOSIS — Z720 Tobacco use: Secondary | ICD-10-CM | POA: Diagnosis not present

## 2022-03-03 DIAGNOSIS — K922 Gastrointestinal hemorrhage, unspecified: Secondary | ICD-10-CM

## 2022-03-03 MED ORDER — ATORVASTATIN CALCIUM 80 MG PO TABS: 80.0000 mg | ORAL_TABLET | Freq: Every day | ORAL | 3 refills | Status: DC

## 2022-03-03 NOTE — Patient Instructions (Signed)
Medication Instructions:  Your Physician recommend you continue on your current medication as directed.    Ok to hold Plavix for 5 days prior to procedure, then resume after.  *If you need a refill on your cardiac medications before your next appointment, please call your pharmacy*   Lab Work: None ordered today   Testing/Procedures: None ordered today   Follow-Up: At [facility], you and your health needs are our priority.  As part of our continuing mission to provide you with exceptional heart care, we have created designated Provider Care Teams.  These Care Teams include your primary Cardiologist (physician) and Advanced Practice Providers (APPs -  Physician Assistants and Nurse Practitioners) who all work together to provide you with the care you need, when you need it.  We recommend signing up for the patient portal called "MyChart".  Sign up information is provided on this After Visit Summary.  MyChart is used to connect with patients for Virtual Visits (Telemedicine).  Patients are able to view lab/test results, encounter notes, upcoming appointments, etc.  Non-urgent messages can be sent to your provider as well.   To learn more about what you can do with MyChart, go to [email or web address].    Your next appointment:   3 month(s)  The format for your next appointment:   In Person  Provider:   You may see Ida Rogue, MD or one of the following Advanced Practice Providers on your designated Care Team:   Murray Hodgkins, NP Christell Faith, PA-C Cadence Kathlen Mody, PA-C Gerrie Nordmann, NP

## 2022-03-04 ENCOUNTER — Telehealth: Payer: Self-pay

## 2022-03-04 NOTE — Telephone Encounter (Signed)
Patient verbalized understanding of instructions  

## 2022-03-04 NOTE — Telephone Encounter (Signed)
Per Cardiology With continuous bleeding issues it is reasonable to hold her clopidogrel for 5 days with restarting the day after her procedure as long as it is okay with GI and there is not a large quantity of polyps that had to be removed during her procedure.

## 2022-03-06 ENCOUNTER — Telehealth: Payer: Self-pay | Admitting: Cardiovascular Disease

## 2022-03-06 DIAGNOSIS — I739 Peripheral vascular disease, unspecified: Secondary | ICD-10-CM

## 2022-03-06 NOTE — Telephone Encounter (Signed)
  Pt said, she saw her ENT today and they are concern about her plaque in her coronary arteries in her neck. She is calling to get recommendation from Dr. Fletcher Anon  or Melinda Crutch

## 2022-03-06 NOTE — Telephone Encounter (Signed)
Returned the call to the patient who stated that her ENT ordered a neck ct. She stated that her ENT stated that the CT showed plaque in her carotids and wanted to refer her to a Vascular Specialist.   Call placed to Dr. Erlene Quan MA ENT at [facility] to get the results faxed over. The office was closed. Will call back on Monday.

## 2022-03-09 NOTE — Telephone Encounter (Signed)
Attempted to reach the office of Dr. Gaylyn Cheers to have the results of the neck CT faxed.   Left a message and this office's fax number.

## 2022-03-10 NOTE — Telephone Encounter (Signed)
She needs carotid Doppler done.

## 2022-03-11 ENCOUNTER — Encounter: Payer: Self-pay | Admitting: Gastroenterology

## 2022-03-11 ENCOUNTER — Ambulatory Visit: Payer: Commercial Managed Care - HMO | Admitting: Certified Registered"

## 2022-03-11 ENCOUNTER — Encounter
Admission: RE | Disposition: A | Discharge status: Home / Self Care | Payer: Self-pay | Source: Home / Self Care | Attending: Gastroenterology

## 2022-03-11 ENCOUNTER — Ambulatory Visit
Admission: RE | Admit: 2022-03-11 | Discharge: 2022-03-11 | Disposition: A | Discharge status: Home / Self Care | Payer: Commercial Managed Care - HMO | Attending: Gastroenterology | Admitting: Gastroenterology

## 2022-03-11 ENCOUNTER — Other Ambulatory Visit: Payer: Self-pay

## 2022-03-11 DIAGNOSIS — Z85810 Personal history of malignant neoplasm of tongue: Secondary | ICD-10-CM | POA: Insufficient documentation

## 2022-03-11 DIAGNOSIS — K279 Peptic ulcer, site unspecified, unspecified as acute or chronic, without hemorrhage or perforation: Secondary | ICD-10-CM | POA: Diagnosis not present

## 2022-03-11 DIAGNOSIS — F17210 Nicotine dependence, cigarettes, uncomplicated: Secondary | ICD-10-CM | POA: Insufficient documentation

## 2022-03-11 DIAGNOSIS — I10 Essential (primary) hypertension: Secondary | ICD-10-CM | POA: Diagnosis not present

## 2022-03-11 DIAGNOSIS — D125 Benign neoplasm of sigmoid colon: Secondary | ICD-10-CM | POA: Diagnosis not present

## 2022-03-11 DIAGNOSIS — Z8711 Personal history of peptic ulcer disease: Secondary | ICD-10-CM | POA: Insufficient documentation

## 2022-03-11 DIAGNOSIS — Z1211 Encounter for screening for malignant neoplasm of colon: Secondary | ICD-10-CM | POA: Insufficient documentation

## 2022-03-11 DIAGNOSIS — I739 Peripheral vascular disease, unspecified: Secondary | ICD-10-CM | POA: Insufficient documentation

## 2022-03-11 DIAGNOSIS — K5730 Diverticulosis of large intestine without perforation or abscess without bleeding: Secondary | ICD-10-CM | POA: Insufficient documentation

## 2022-03-11 DIAGNOSIS — K635 Polyp of colon: Secondary | ICD-10-CM | POA: Diagnosis not present

## 2022-03-11 DIAGNOSIS — D122 Benign neoplasm of ascending colon: Secondary | ICD-10-CM | POA: Diagnosis not present

## 2022-03-11 DIAGNOSIS — Z955 Presence of coronary angioplasty implant and graft: Secondary | ICD-10-CM | POA: Insufficient documentation

## 2022-03-11 DIAGNOSIS — I2510 Atherosclerotic heart disease of native coronary artery without angina pectoris: Secondary | ICD-10-CM | POA: Insufficient documentation

## 2022-03-11 DIAGNOSIS — K2289 Other specified disease of esophagus: Secondary | ICD-10-CM | POA: Insufficient documentation

## 2022-03-11 HISTORY — PX: COLONOSCOPY WITH PROPOFOL: SHX5780

## 2022-03-11 HISTORY — PX: ESOPHAGOGASTRODUODENOSCOPY (EGD) WITH PROPOFOL: SHX5813

## 2022-03-11 SURGERY — COLONOSCOPY WITH PROPOFOL
Anesthesia: General

## 2022-03-11 MED ORDER — EPHEDRINE SULFATE (PRESSORS) 50 MG/ML IJ SOLN
INTRAMUSCULAR | Status: DC | PRN
  Administered 2022-03-11: 10 mg via INTRAVENOUS
  Administered 2022-03-11: 5 mg via INTRAVENOUS
  Administered 2022-03-11: 10 mg via INTRAVENOUS

## 2022-03-11 MED ORDER — SODIUM CHLORIDE 0.9 % IV SOLN: INTRAVENOUS | Status: DC

## 2022-03-11 MED ORDER — PROPOFOL 10 MG/ML IV BOLUS
INTRAVENOUS | Status: DC | PRN
  Administered 2022-03-11: 40 mg via INTRAVENOUS
  Administered 2022-03-11: 50 mg via INTRAVENOUS
  Administered 2022-03-11: 10 mg via INTRAVENOUS

## 2022-03-11 MED ORDER — GLYCOPYRROLATE 0.2 MG/ML IJ SOLN
INTRAMUSCULAR | Status: DC | PRN
  Administered 2022-03-11: .2 mg via INTRAVENOUS

## 2022-03-11 MED ORDER — PROPOFOL 500 MG/50ML IV EMUL
INTRAVENOUS | Status: DC | PRN
  Administered 2022-03-11: 155 ug/kg/min via INTRAVENOUS

## 2022-03-11 MED ORDER — DEXMEDETOMIDINE HCL IN NACL 200 MCG/50ML IV SOLN
INTRAVENOUS | Status: DC | PRN
  Administered 2022-03-11: 12 ug via INTRAVENOUS

## 2022-03-11 MED ORDER — MIDAZOLAM HCL 2 MG/2ML IJ SOLN
INTRAMUSCULAR | Status: AC
  Filled 2022-03-11: qty 2

## 2022-03-11 MED ORDER — MIDAZOLAM HCL 2 MG/2ML IJ SOLN
INTRAMUSCULAR | Status: DC | PRN
  Administered 2022-03-11: 2 mg via INTRAVENOUS

## 2022-03-11 MED ORDER — LIDOCAINE HCL (CARDIAC) PF 100 MG/5ML IV SOSY
PREFILLED_SYRINGE | INTRAVENOUS | Status: DC | PRN
  Administered 2022-03-11: 100 mg via INTRAVENOUS

## 2022-03-11 NOTE — Transfer of Care (Signed)
Immediate Anesthesia Transfer of Care Note  Patient: Sonya Harmon  Procedure(s) Performed: COLONOSCOPY WITH PROPOFOL ESOPHAGOGASTRODUODENOSCOPY (EGD) WITH PROPOFOL  Patient Location: Endoscopy Unit  Anesthesia Type:General  Level of Consciousness: awake, drowsy, and patient cooperative  Airway & Oxygen Therapy: Patient Spontanous Breathing and Patient connected to face mask oxygen  Post-op Assessment: Report given to RN and Post -op Vital signs reviewed and stable  Post vital signs: Reviewed and stable  Last Vitals:  Vitals Value Taken Time  BP 104/92 03/11/22 1124  Temp 36.4 C 03/11/22 1114  Pulse 78 03/11/22 1119  Resp 14 03/11/22 1125  SpO2 100 % 03/11/22 1119  Vitals shown include unvalidated device data.  Last Pain:  Vitals:   03/11/22 1114  TempSrc: Temporal  PainSc: 0-No pain         Complications: No notable events documented.

## 2022-03-11 NOTE — H&P (Signed)
[name], MD [address]  [place]  [place], [place] [address]  Main: [phone]  Fax: [phone] Pager: [phone]  Primary Care Physician:  Leonides Sake, MD Primary Gastroenterologist:  Dr. [name]  Pre-Procedure History & Physical: HPI:  Sonya Harmon is a 51 y.o. female is here for an endoscopy and colonoscopy.   Past Medical History:  Diagnosis Date   Alcohol abuse    Coronary artery disease 09/2021   s/p PCI to RCA in setting of unstable angina   History of tongue cancer    PAD (peripheral artery disease) (HCC)    Tobacco abuse     Past Surgical History:  Procedure Laterality Date   ABDOMINAL AORTOGRAM W/LOWER EXTREMITY N/A 10/15/2021   Procedure: ABDOMINAL AORTOGRAM W/LOWER EXTREMITY;  Surgeon: Wellington Hampshire, MD;  Location: [place] [facility];  Service: Cardiovascular;  Laterality: N/A;   BILATERAL SALPINGECTOMY     CORONARY BALLOON ANGIOPLASTY N/A 09/08/2021   Procedure: CORONARY BALLOON ANGIOPLASTY - ABORTED;  Surgeon: Wellington Hampshire, MD;  Location: [place] [facility];  Service: Cardiovascular;  Laterality: N/A;   CORONARY STENT INTERVENTION N/A 09/10/2021   Procedure: CORONARY STENT INTERVENTION;  Surgeon: Wellington Hampshire, MD;  Location: [place] [facility];  Service: Cardiovascular;  Laterality: N/A;   ESOPHAGOGASTRODUODENOSCOPY (EGD) WITH PROPOFOL N/A 11/01/2021   Procedure: ESOPHAGOGASTRODUODENOSCOPY (EGD) WITH PROPOFOL;  Surgeon: Lin Landsman, MD;  Location: [facility] ENDOSCOPY;  Service: Gastroenterology;  Laterality: N/A;   LEFT HEART CATH AND CORONARY ANGIOGRAPHY N/A 09/08/2021   Procedure: LEFT HEART CATH AND CORONARY ANGIOGRAPHY;  Surgeon: Dionisio David, MD;  Location: [place] [facility];  Service: Cardiovascular;  Laterality: N/A;   PERIPHERAL VASCULAR INTERVENTION  10/15/2021   Procedure: PERIPHERAL VASCULAR INTERVENTION;  Surgeon: Wellington Hampshire, MD;  Location: [place] [facility];  Service: Cardiovascular;;    TONGUE SURGERY      Prior to Admission medications   Medication Sig Start Date End Date Taking? Authorizing Provider  atorvastatin (LIPITOR) 80 MG tablet Take 1 tablet (80 mg total) by mouth daily. 03/03/22  Yes Hammock, Barbera Setters, NP  buPROPion (WELLBUTRIN SR) 150 MG 12 hr tablet Take 1 tablet (150 mg total) by mouth 2 (two) times daily. Take 1 tablet daily for the first three days. Then take 1 tablet twice daily. 11/24/21  Yes Hammock, Barbera Setters, NP  famotidine (PEPCID) 20 MG tablet Take 20 mg by mouth daily.   Yes [provider]  gabapentin (NEURONTIN) 300 MG capsule Take 300 mg by mouth 3 (three) times daily.   Yes [provider]  losartan-hydrochlorothiazide (HYZAAR) 50-12.5 MG tablet Take 1 tablet by mouth daily.   Yes [provider]  methocarbamol (ROBAXIN) 750 MG tablet Take 750 mg by mouth every 6 (six) hours as needed for muscle spasms. 01/26/22  Yes [provider]  metoprolol tartrate (LOPRESSOR) 25 MG tablet Take 0.5 tablets (12.5 mg total) by mouth 2 (two) times daily. 01/30/22  Yes Hammock, Barbera Setters, NP  omeprazole (PRILOSEC) 40 MG capsule Take 1 capsule (40 mg total) by mouth daily. 02/04/22 01/30/23 Yes Hammock, Barbera Setters, NP  baclofen (LIORESAL) 10 MG tablet Take 10 mg by mouth 3 (three) times daily. 02/09/22 03/11/22  [provider]  clopidogrel (PLAVIX) 75 MG tablet Take 1 tablet (75 mg total) by mouth daily. 01/30/22 01/30/23  Gerrie Nordmann, NP    Allergies as of 02/23/2022 - Review Complete 01/26/2022  Allergen Reaction Noted   Amoxicill-clarithro-lansopraz Nausea And Vomiting  11/22/2015   Amoxicillin Rash 09/07/2021    Family History  Problem Relation Age of Onset   Cancer Mother    Cancer Father    Stroke Sister    Aneurysm Sister     Social History   Socioeconomic History   Marital status: Married    Spouse name: Not on file   Number of children: Not on file   Years of education: Not on file   Highest education level: Not  on file  Occupational History   Not on file  Tobacco Use   Smoking status: Every Day    Packs/day: 0.50    Types: Cigarettes    Last attempt to quit: 09/07/2021    Years since quitting: 0.5   Smokeless tobacco: Former  Scientific laboratory technician Use: Former  Substance and Sexual Activity   Alcohol use: Not Currently    Alcohol/week: 42.0 standard drinks of alcohol    Types: 42 Cans of beer per week    Comment: quit in july 2023   Drug use: Not Currently    Types: Marijuana   Sexual activity: Yes    Partners: Male  Other Topics Concern   Not on file  Social History Narrative   ** Merged History Encounter **       Social Determinants of Health   Financial Resource Strain: Not on file  Food Insecurity: Not on file  Transportation Needs: Not on file  Physical Activity: Not on file  Stress: Not on file  Social Connections: Not on file  Intimate Partner Violence: Not on file    Review of Systems: See HPI, otherwise negative ROS  Physical Exam: BP (!) 134/95   Pulse 65   Temp (!) 97.5 F (36.4 C) (Temporal)   Resp 18   Ht '5\' 2"'$  (1.575 m)   Wt 47.2 kg   SpO2 100%   BMI 19.02 kg/m  General:   Alert,  pleasant and cooperative in NAD Head:  Normocephalic and atraumatic. Neck:  Supple; no masses or thyromegaly. Lungs:  Clear throughout to auscultation.    Heart:  Regular rate and rhythm. Abdomen:  Soft, nontender and nondistended. Normal bowel sounds, without guarding, and without rebound.   Neurologic:  Alert and  oriented x4;  grossly normal neurologically.  Impression/Plan: Sonya Harmon is here for an endoscopy and colonoscopy to be performed for follow up of gastric ulcer, colon cancer screening  Risks, benefits, limitations, and alternatives regarding  endoscopy and colonoscopy have been reviewed with the patient.  Questions have been answered.  All parties agreeable.   Sherri Sear, MD  03/11/2022, 10:24 AM

## 2022-03-11 NOTE — Anesthesia Preprocedure Evaluation (Signed)
Anesthesia Evaluation  Patient identified by MRN, date of birth, ID band Patient awake    Reviewed: Allergy & Precautions, NPO status , Patient's Chart, lab work & pertinent test results  History of Anesthesia Complications Negative for: history of anesthetic complications  Airway Mallampati: II  TM Distance: >3 FB Neck ROM: full    Dental  (+) Edentulous Upper, Poor Dentition   Pulmonary neg pulmonary ROS, Current Smoker   Pulmonary exam normal        Cardiovascular hypertension, On Medications and Pt. on medications (-) angina + CAD, + Cardiac Stents (last dose of plavix 12/1) and + Peripheral Vascular Disease  (-) DOE Normal cardiovascular exam  IMPRESSIONS     1. Left ventricular ejection fraction, by estimation, is 60 to 65%. The  left ventricle has normal function. The left ventricle has no regional  wall motion abnormalities. Left ventricular diastolic parameters were  normal.   2. Right ventricular systolic function is normal. The right ventricular  size is normal. There is normal pulmonary artery systolic pressure. The  estimated right ventricular systolic pressure is 57.2 mmHg.   3. The mitral valve is normal in structure. Trivial mitral valve  regurgitation. No evidence of mitral stenosis.   4. The aortic valve is normal in structure. Aortic valve regurgitation is  not visualized. No aortic stenosis is present.   5. The inferior vena cava is normal in size with greater than 50%  respiratory variability, suggesting right atrial pressure of 3 mmHg.     Neuro/Psych CVA, No Residual Symptoms  negative psych ROS   GI/Hepatic negative GI ROS,,,(+)     substance abuse  alcohol use  Endo/Other  negative endocrine ROS    Renal/GU negative Renal ROS  negative genitourinary   Musculoskeletal   Abdominal   Peds  Hematology negative hematology ROS (+)   Anesthesia Other Findings Past Medical History: No  date: Alcohol abuse 09/2021: Coronary artery disease     Comment:  s/p PCI to RCA in setting of unstable angina No date: History of tongue cancer No date: PAD (peripheral artery disease) ([place]) No date: Tobacco abuse  Past Surgical History: 10/15/2021: ABDOMINAL AORTOGRAM W/LOWER EXTREMITY; N/A     Comment:  Procedure: ABDOMINAL AORTOGRAM W/LOWER EXTREMITY;                Surgeon: Wellington Hampshire, MD;  Location: [place] [facility];  Service: Cardiovascular;  Laterality: N/A; No date: BILATERAL SALPINGECTOMY 09/08/2021: CORONARY BALLOON ANGIOPLASTY; N/A     Comment:  Procedure: CORONARY BALLOON ANGIOPLASTY - ABORTED;                Surgeon: Wellington Hampshire, MD;  Location: [place]               [facility];  Service: Cardiovascular;  Laterality: N/A; 09/10/2021: CORONARY STENT INTERVENTION; N/A     Comment:  Procedure: CORONARY STENT INTERVENTION;  Surgeon: Wellington Hampshire, MD;  Location: [place] [facility];                Service: Cardiovascular;  Laterality: N/A; 11/01/2021: ESOPHAGOGASTRODUODENOSCOPY (EGD) WITH PROPOFOL; N/A     Comment:  Procedure: ESOPHAGOGASTRODUODENOSCOPY (EGD) WITH               PROPOFOL;  Surgeon: Lin Landsman, MD;  Location:  [facility] ENDOSCOPY;  Service: Gastroenterology;  Laterality:               N/A; 09/08/2021: LEFT HEART CATH AND CORONARY ANGIOGRAPHY; N/A     Comment:  Procedure: LEFT HEART CATH AND CORONARY ANGIOGRAPHY;                Surgeon: Dionisio David, MD;  Location: [place] [facility];  Service: Cardiovascular;  Laterality: N/A; 10/15/2021: PERIPHERAL VASCULAR INTERVENTION     Comment:  Procedure: PERIPHERAL VASCULAR INTERVENTION;  Surgeon:               Wellington Hampshire, MD;  Location: [place] [facility];                Service: Cardiovascular;; No date: TONGUE SURGERY     Reproductive/Obstetrics negative OB ROS                             Anesthesia  Physical Anesthesia Plan  ASA: 3  Anesthesia Plan: General   Post-op Pain Management: Minimal or no pain anticipated   Induction: Intravenous  PONV Risk Score and Plan: Propofol infusion and TIVA  Airway Management Planned: Natural Airway and Nasal Cannula  Additional Equipment:   Intra-op Plan:   Post-operative Plan:   Informed Consent: I have reviewed the patients History and Physical, chart, labs and discussed the procedure including the risks, benefits and alternatives for the proposed anesthesia with the patient or authorized representative who has indicated his/her understanding and acceptance.     Dental Advisory Given  Plan Discussed with: Anesthesiologist, CRNA and Surgeon  Anesthesia Plan Comments: (Patient consented for risks of anesthesia including but not limited to:  - adverse reactions to medications - risk of airway placement if required - damage to eyes, teeth, lips or other oral mucosa - nerve damage due to positioning  - sore throat or hoarseness - Damage to heart, brain, nerves, lungs, other parts of body or loss of life  Patient voiced understanding.)        Anesthesia Quick Evaluation

## 2022-03-11 NOTE — Anesthesia Postprocedure Evaluation (Signed)
Anesthesia Post Note  Patient: Sonya Harmon  Procedure(s) Performed: COLONOSCOPY WITH PROPOFOL ESOPHAGOGASTRODUODENOSCOPY (EGD) WITH PROPOFOL  Patient location during evaluation: Endoscopy Anesthesia Type: General Level of consciousness: awake and alert Pain management: pain level controlled Vital Signs Assessment: post-procedure vital signs reviewed and stable Respiratory status: spontaneous breathing, nonlabored ventilation, respiratory function stable and patient connected to nasal cannula oxygen Cardiovascular status: blood pressure returned to baseline and stable Postop Assessment: no apparent nausea or vomiting Anesthetic complications: no  No notable events documented.   Last Vitals:  Vitals:   03/11/22 1124 03/11/22 1134  BP:  117/68  Pulse:    Resp:    Temp:    SpO2: 100%     Last Pain:  Vitals:   03/11/22 1134  TempSrc:   PainSc: 0-No pain                 Ilene Qua

## 2022-03-11 NOTE — Op Note (Addendum)
[facility] Gastroenterology Patient Name: Sonya Harmon Procedure Date: 03/11/2022 10:14 AM MRN: [id number] Account #: [id number] Date of Birth: 28-Sep-1970 Admit Type: Outpatient Age: 51 Room: [facility] ENDO ROOM 4 Gender: Female Note Status: Finalized Instrument Name: Upper Endoscope [id number] Procedure:             Upper GI endoscopy Indications:           Follow-up of peptic ulcer Providers:             Lin Landsman MD, MD Medicines:             General Anesthesia Complications:         No immediate complications. Estimated blood loss: None. Procedure:             Pre-Anesthesia Assessment:                        - Prior to the procedure, a History and Physical was                         performed, and patient medications and allergies were                         reviewed. The patient is competent. The risks and                         benefits of the procedure and the sedation options and                         risks were discussed with the patient. All questions                         were answered and informed consent was obtained.                         Patient identification and proposed procedure were                         verified by the physician, the nurse, the                         anesthesiologist, the anesthetist and the technician                         in the pre-procedure area in the procedure room in the                         endoscopy suite. Mental Status Examination: alert and                         oriented. Airway Examination: normal oropharyngeal                         airway and neck mobility. Respiratory Examination:                         clear to auscultation. CV Examination: normal.                         Prophylactic  Antibiotics: The patient does not require                         prophylactic antibiotics. Prior Anticoagulants: The                         patient has taken Plavix (clopidogrel), last dose was                          5 days prior to procedure. ASA Grade Assessment: III -                         A patient with severe systemic disease. After                         reviewing the risks and benefits, the patient was                         deemed in satisfactory condition to undergo the                         procedure. The anesthesia plan was to use general                         anesthesia. Immediately prior to administration of                         medications, the patient was re-assessed for adequacy                         to receive sedatives. The heart rate, respiratory                         rate, oxygen saturations, blood pressure, adequacy of                         pulmonary ventilation, and response to care were                         monitored throughout the procedure. The physical                         status of the patient was re-assessed after the                         procedure.                        After obtaining informed consent, the endoscope was                         passed under direct vision. Throughout the procedure,                         the patient's blood pressure, pulse, and oxygen                         saturations were monitored continuously. The Endoscope  was introduced through the mouth, and advanced to the                         second part of duodenum. The upper GI endoscopy was                         accomplished without difficulty. The patient tolerated                         the procedure well. Findings:      The duodenal bulb and second portion of the duodenum were normal.      The entire examined stomach was normal.      The cardia and gastric fundus were normal on retroflexion.      Esophagogastric landmarks were identified: the gastroesophageal junction       was found at 39 cm from the incisors.      Localized mild mucosal changes characterized by granularity were found       in the lower third of the  esophagus at 35cm. Biopsies were taken with a       cold forceps for histology.      The gastroesophageal junction was normal. Impression:            - Normal duodenal bulb and second portion of the                         duodenum.                        - Normal stomach.                        - Esophagogastric landmarks identified.                        - Granular mucosa in the esophagus. Biopsied.                        - Normal gastroesophageal junction. Recommendation:        - Await pathology results.                        - Proceed with colonoscopy as scheduled                        See colonoscopy report Procedure Code(s):     --- Professional ---                        [phone], Esophagogastroduodenoscopy, flexible,                         transoral; with biopsy, single or multiple Diagnosis Code(s):     --- Professional ---                        K22.89, Other specified disease of esophagus                        K27.9, Peptic ulcer, site unspecified, unspecified as  acute or chronic, without hemorrhage or perforation CPT copyright 2022 American Medical Association. All rights reserved. The codes documented in this report are preliminary and upon coder review may  be revised to meet current compliance requirements. Dr. Ulyess Mort Lin Landsman MD, MD 03/11/2022 10:40:14 AM This report has been signed electronically. Number of Addenda: 0 Note Initiated On: 03/11/2022 10:14 AM Estimated Blood Loss:  Estimated blood loss: none.      [facility]

## 2022-03-11 NOTE — Op Note (Addendum)
[facility] Gastroenterology Patient Name: Sonya Harmon Procedure Date: 03/11/2022 10:13 AM MRN: [id number] Account #: [id number] Date of Birth: Jun 23, 1970 Admit Type: Outpatient Age: 51 Room: [facility] ENDO ROOM 4 Gender: Female Note Status: Finalized Instrument Name: Peds Colonoscope [id number] Procedure:             Colonoscopy Indications:           Screening for colorectal malignant neoplasm, This is                         the patient's first colonoscopy Providers:             Lin Landsman MD, MD Medicines:             General Anesthesia Complications:         No immediate complications. Estimated blood loss: None. Procedure:             Pre-Anesthesia Assessment:                        - Prior to the procedure, a History and Physical was                         performed, and patient medications and allergies were                         reviewed. The patient is competent. The risks and                         benefits of the procedure and the sedation options and                         risks were discussed with the patient. All questions                         were answered and informed consent was obtained.                         Patient identification and proposed procedure were                         verified by the physician, the nurse, the                         anesthesiologist, the anesthetist and the technician                         in the pre-procedure area in the procedure room in the                         endoscopy suite. Mental Status Examination: alert and                         oriented. Airway Examination: normal oropharyngeal                         airway and neck mobility. Respiratory Examination:                         clear to  auscultation. CV Examination: normal.                         Prophylactic Antibiotics: The patient does not require                         prophylactic antibiotics. Prior Anticoagulants: The                          patient has taken Plavix (clopidogrel), last dose was                         5 days prior to procedure. ASA Grade Assessment: III -                         A patient with severe systemic disease. After                         reviewing the risks and benefits, the patient was                         deemed in satisfactory condition to undergo the                         procedure. The anesthesia plan was to use general                         anesthesia. Immediately prior to administration of                         medications, the patient was re-assessed for adequacy                         to receive sedatives. The heart rate, respiratory                         rate, oxygen saturations, blood pressure, adequacy of                         pulmonary ventilation, and response to care were                         monitored throughout the procedure. The physical                         status of the patient was re-assessed after the                         procedure.                        After obtaining informed consent, the colonoscope was                         passed under direct vision. Throughout the procedure,                         the patient's blood pressure, pulse, and oxygen  saturations were monitored continuously. The                         Colonoscope was introduced through the anus and                         advanced to the the cecum, identified by appendiceal                         orifice and ileocecal valve. The colonoscopy was                         performed without difficulty. The patient tolerated                         the procedure well. The quality of the bowel                         preparation was evaluated using the BBPS [facility] Bowel                         Preparation Scale) with scores of: Right Colon = 3,                         Transverse Colon = 3 and Left Colon = 3 (entire mucosa                         seen well  with no residual staining, small fragments                         of stool or opaque liquid). The total BBPS score                         equals 9. The terminal ileum, ileocecal valve,                         appendiceal orifice, and rectum were photographed. Findings:      The perianal and digital rectal examinations were normal. Pertinent       negatives include normal sphincter tone and no palpable rectal lesions.      The terminal ileum appeared normal.      A 25 mm polyp was found in the proximal ascending colon. The polyp was       carpet-like, flat and granular lateral spreading. Preparations were made       for mucosal resection. Demarcation of the lesion was performed with       narrow band imaging to clearly identify the boundaries of the lesion.       Eleview was injected to raise the lesion. Snare mucosal resection was       performed. Resection and retrieval were complete. Resected tissue       including tissue margins will be examined by histology. To prevent       bleeding after mucosal resection, three hemostatic clips were       successfully placed (MR safe). Clip manufacturer: [employer].       There was no bleeding during, or at the end, of the procedure. Estimated       blood loss: none.  Three sessile polyps were found in the sigmoid colon. The polyps were 5       to 8 mm in size. These polyps were removed with a cold snare. Resection       and retrieval were complete. Estimated blood loss: none.      Multiple diverticula were found in the recto-sigmoid colon and sigmoid       colon.      The retroflexed view of the distal rectum and anal verge was normal and       showed no anal or rectal abnormalities. Impression:            - The examined portion of the ileum was normal.                        - One 25 mm polyp in the proximal ascending colon,                         removed with mucosal resection. Resected and                         retrieved. Clip  manufacturer: [employer]. Clips                         (MR safe) were placed.                        - Three 5 to 8 mm polyps in the sigmoid colon, removed                         with a cold snare. Resected and retrieved.                        - Diverticulosis in the recto-sigmoid colon and in the                         sigmoid colon.                        - The distal rectum and anal verge are normal on                         retroflexion view.                        - Mucosal resection was performed. Resection and                         retrieval were complete. Recommendation:        - Discharge patient to home (with escort).                        - Resume previous diet today.                        - Continue present medications.                        - Await pathology results.                        -  Repeat colonoscopy in 1 year for surveillance after                         piecemeal polypectomy.                        - Resume Plavix (clopidogrel) in 3 days at prior dose.                         Refer to managing physician for further adjustment of                         therapy. Procedure Code(s):     --- Professional ---                        [phone], Colonoscopy, flexible; with endoscopic mucosal                         resection                        [phone], 16, Colonoscopy, flexible; with removal of                         tumor(s), polyp(s), or other lesion(s) by snare                         technique Diagnosis Code(s):     --- Professional ---                        Z12.11, Encounter for screening for malignant neoplasm                         of colon                        D12.2, Benign neoplasm of ascending colon                        D12.5, Benign neoplasm of sigmoid colon                        K57.30, Diverticulosis of large intestine without                         perforation or abscess without bleeding CPT copyright 2022 American Medical  Association. All rights reserved. The codes documented in this report are preliminary and upon coder review may  be revised to meet current compliance requirements. Dr. Ulyess Mort Lin Landsman MD, MD 03/11/2022 11:19:16 AM This report has been signed electronically. Number of Addenda: 0 Note Initiated On: 03/11/2022 10:13 AM Scope Withdrawal Time: 0 hours 21 minutes 44 seconds  Total Procedure Duration: 0 hours 27 minutes 1 second  Estimated Blood Loss:  Estimated blood loss: none.      [facility]

## 2022-03-11 NOTE — Anesthesia Procedure Notes (Signed)
Procedure Name: General with mask airway Date/Time: 03/11/2022 10:44 AM  Performed by: Kelton Pillar, CRNAPre-anesthesia Checklist: Patient identified, Emergency Drugs available, Suction available and Patient being monitored Patient Re-evaluated:Patient Re-evaluated prior to induction Oxygen Delivery Method: Simple face mask Induction Type: IV induction Placement Confirmation: positive ETCO2, CO2 detector and breath sounds checked- equal and bilateral Dental Injury: Teeth and Oropharynx as per pre-operative assessment

## 2022-03-12 ENCOUNTER — Encounter: Payer: Self-pay | Admitting: Gastroenterology

## 2022-03-12 LAB — SURGICAL PATHOLOGY

## 2022-03-12 NOTE — Telephone Encounter (Signed)
Patient has been made aware. Doppler has been ordered and message sent to scheduling.

## 2022-03-13 ENCOUNTER — Encounter: Payer: Self-pay | Admitting: Gastroenterology

## 2022-03-16 ENCOUNTER — Telehealth: Payer: Self-pay | Admitting: Cardiovascular Disease

## 2022-03-16 NOTE — Telephone Encounter (Signed)
   Pre-operative Risk Assessment    Patient Name: Sonya Harmon  DOB: 1971/01/06 MRN: [id number]      Request for Surgical Clearance    Procedure:  Cervical ILESI  Date of Surgery:  Clearance TBD                                 Surgeon:  not listed Surgeon's Group or Practice Name:  Not listed Phone number:  [phone] Fax number:  [phone]   Type of Clearance Requested:  Pharmacy, Plavix    Type of Anesthesia:  Not Indicated   Additional requests/questions:    Signed, Maxwell Caul   03/16/2022, 2:51 PM

## 2022-03-16 NOTE — Telephone Encounter (Signed)
   Patient Name: Sonya Harmon  DOB: 27-Apr-1970 MRN: [id number]  Primary Cardiologist: Ida Rogue, MD  Chart reviewed as part of pre-operative protocol coverage. Pharmacy clearance only. Per office protocols, [facility] may hold Plavix 5 days prior to planned procedure.   I will route this recommendation to the requesting party via Epic fax function and remove from pre-op pool.  Please call with questions.  Loel Dubonnet, NP 03/16/2022, 4:32 PM

## 2022-03-18 ENCOUNTER — Other Ambulatory Visit: Payer: Self-pay | Admitting: Family Medicine

## 2022-03-18 DIAGNOSIS — M5412 Radiculopathy, cervical region: Secondary | ICD-10-CM

## 2022-03-26 ENCOUNTER — Encounter: Payer: Self-pay | Admitting: Cardiology

## 2022-04-13 NOTE — Telephone Encounter (Signed)
Received request via fax  from [facility] requesting update on status of clearance  Attn: Vivien Presto Phone [phone] Fax [phone]

## 2022-04-13 NOTE — Telephone Encounter (Signed)
I will re-fax  the clearance notes from Laurann Montana, NP.

## 2022-04-24 ENCOUNTER — Ambulatory Visit
Admission: RE | Admit: 2022-04-24 | Discharge: 2022-04-24 | Disposition: A | Discharge status: Home / Self Care | Payer: Commercial Managed Care - HMO | Source: Ambulatory Visit | Attending: Family Medicine | Admitting: Family Medicine

## 2022-04-24 DIAGNOSIS — M5412 Radiculopathy, cervical region: Secondary | ICD-10-CM

## 2022-04-24 MED ORDER — IOPAMIDOL (ISOVUE-M 300) INJECTION 61%
1.0000 mL | Freq: Once | INTRAMUSCULAR | Status: AC | PRN
  Administered 2022-04-24: 1 mL via EPIDURAL

## 2022-04-24 MED ORDER — TRIAMCINOLONE ACETONIDE 40 MG/ML IJ SUSP (RADIOLOGY)
60.0000 mg | Freq: Once | INTRAMUSCULAR | Status: AC
  Administered 2022-04-24: 60 mg via EPIDURAL

## 2022-04-24 NOTE — Discharge Instructions (Signed)
Post Procedure Spinal Discharge Instruction Sheet  You may resume a regular diet and any medications that you routinely take (including pain medications) unless otherwise noted by MD.  No driving day of procedure.  Light activity throughout the rest of the day.  Do not do any strenuous work, exercise, bending or lifting.  The day following the procedure, you can resume normal physical activity but you should refrain from exercising or physical therapy for at least three days thereafter.  You may apply ice to the injection site, 20 minutes on, 20 minutes off, as needed. Do not apply ice directly to skin.    Common Side Effects:  Headaches- take your usual medications as directed by your physician.  Increase your fluid intake.  Caffeinated beverages may be helpful.  Lie flat in bed until your headache resolves.  Restlessness or inability to sleep- you may have trouble sleeping for the next few days.  Ask your referring physician if you need any medication for sleep.  Facial flushing or redness- should subside within a few days.  Increased pain- a temporary increase in pain a day or two following your procedure is not unusual.  Take your pain medication as prescribed by your referring physician.  Leg cramps  Please contact our office at [phone] for the following symptoms: Fever greater than 100 degrees. Headaches unresolved with medication after 2-3 days. Increased swelling, pain, or redness at injection site.   Thank you for visiting [facility] today.   YOU  MAY RESUME YOUR PLAVIX IMMEDIATELY AFTER PROCEDURE TODAY

## 2022-05-06 ENCOUNTER — Ambulatory Visit: Payer: Commercial Managed Care - HMO | Attending: Cardiovascular Disease

## 2022-05-06 DIAGNOSIS — R55 Syncope and collapse: Secondary | ICD-10-CM

## 2022-05-06 DIAGNOSIS — I739 Peripheral vascular disease, unspecified: Secondary | ICD-10-CM | POA: Diagnosis not present

## 2022-05-06 DIAGNOSIS — I6523 Occlusion and stenosis of bilateral carotid arteries: Secondary | ICD-10-CM | POA: Diagnosis not present

## 2022-05-19 ENCOUNTER — Other Ambulatory Visit: Payer: Self-pay | Admitting: Cardiovascular Disease

## 2022-05-19 DIAGNOSIS — I739 Peripheral vascular disease, unspecified: Secondary | ICD-10-CM

## 2022-05-21 ENCOUNTER — Telehealth: Payer: Self-pay | Admitting: Cardiovascular Disease

## 2022-05-21 NOTE — Telephone Encounter (Signed)
Pt c/o swelling: STAT is pt has developed SOB within 24 hours  How much weight have you gained and in what time span? Not sure   If swelling, where is the swelling located? Right ankle   Are you currently taking a fluid pill? No  Are you currently SOB? No   Do you have a log of your daily weights (if so, list)? No  Have you gained 3 pounds in a day or 5 pounds in a week? N/A  Have you traveled recently? No   Pt states she has a rash on legs and bruising from scratching. She is concerned about the swelling in ankle though and wanted to know if there is anything she could be taking to cause this. Please advise.

## 2022-05-21 NOTE — Telephone Encounter (Signed)
Spoke with patient and she stated that she isn't sure where the rash is coming from and she hasn't changed any of her routine(s). There has been no changes in medications and no changes in soap, deodorant, detergent, skin care etc. Patient denies dyspnea, angina, nausea, vomiting, fever, chills and uncontrolled pain. Patient stated that she would contact PCP for an appointment.

## 2022-05-27 ENCOUNTER — Ambulatory Visit: Payer: Commercial Managed Care - HMO | Attending: Cardiovascular Disease

## 2022-06-04 ENCOUNTER — Ambulatory Visit: Payer: Commercial Managed Care - HMO

## 2022-06-06 NOTE — Progress Notes (Unsigned)
Cardiology Office Note  Date:  06/08/2022   ID:  Sonya Harmon, DOB 11-11-1970, MRN [id number]  PCP:  Leonides Sake, MD   Chief Complaint  Patient presents with   3 month follow up     Patient c/o shortness of breath with over exertion. Medications reviewed by the patient verbally.     HPI:  Sonya Harmon is a 52 y.o. female with history of  CAD status post PCI to the RCA in 09/2021,  PAD status post stenting to the left external iliac artery with drug-coated balloon angioplasty to the distal left common iliac artery,  tongue cancer,  HLD, tobacco use, alcohol use    in the hospital July 2023  for the evaluation of DAPT in the context of hematemesis  Last seen by one of our providers November 2023 Carotid disease, mild nonobstructive, less than 39% bilaterally Who presents for follow-up of her coronary disease, PAD Patient of Dr. Fletcher Anon  Recent phone calls to our office concerning a rash Rash started 2 months ago, on hydroxyzine  Some SOB on hills, random chest pain, concerned for blockages  Denies any GI bleeding Taking Plavix 75 mg daily Continues to smoke 1 pack/day  Prior cardiac records reviewed History of Upper GI bleed/hematemesis with anemia History of alcohol, reported taking ibuprofen daily  for headaches Previously on aspirin and Plavix following recent stent placement to right coronary artery June 2023 also recent angioplasty with stent placement left iliac 3 weeks ago -3 episodes melena past after admission EGD completed : nonbleeding gastric ulcers Bleeding stopped on IV PPI transition to oral Prilosec  -Restarted on Plavix 75 daily, aspirin held per interventional team   History of stent placement to RCA in June 2023  angioplasty/stenting left iliac June 2023 Continues to have pain in her legs on ambulation   Compliant with her Lipitor 80 daily, LDL above goal  EKG personally reviewed by myself on todays visit Normal sinus rhythm rate 60 bpm  nonspecific ST-T wave abnormality  PMH:   has a past medical history of Alcohol abuse, Coronary artery disease (09/2021), History of tongue cancer, PAD (peripheral artery disease) ([place]), and Tobacco abuse.  PSH:    Past Surgical History:  Procedure Laterality Date   ABDOMINAL AORTOGRAM W/LOWER EXTREMITY N/A 10/15/2021   Procedure: ABDOMINAL AORTOGRAM W/LOWER EXTREMITY;  Surgeon: Wellington Hampshire, MD;  Location: [place] [facility];  Service: Cardiovascular;  Laterality: N/A;   BILATERAL SALPINGECTOMY     COLONOSCOPY WITH PROPOFOL N/A 03/11/2022   Procedure: COLONOSCOPY WITH PROPOFOL;  Surgeon: Lin Landsman, MD;  Location: [facility] ENDOSCOPY;  Service: Gastroenterology;  Laterality: N/A;   CORONARY BALLOON ANGIOPLASTY N/A 09/08/2021   Procedure: CORONARY BALLOON ANGIOPLASTY - ABORTED;  Surgeon: Wellington Hampshire, MD;  Location: [place] [facility];  Service: Cardiovascular;  Laterality: N/A;   CORONARY STENT INTERVENTION N/A 09/10/2021   Procedure: CORONARY STENT INTERVENTION;  Surgeon: Wellington Hampshire, MD;  Location: [place] [facility];  Service: Cardiovascular;  Laterality: N/A;   ESOPHAGOGASTRODUODENOSCOPY (EGD) WITH PROPOFOL N/A 11/01/2021   Procedure: ESOPHAGOGASTRODUODENOSCOPY (EGD) WITH PROPOFOL;  Surgeon: Lin Landsman, MD;  Location: [facility] ENDOSCOPY;  Service: Gastroenterology;  Laterality: N/A;   ESOPHAGOGASTRODUODENOSCOPY (EGD) WITH PROPOFOL N/A 03/11/2022   Procedure: ESOPHAGOGASTRODUODENOSCOPY (EGD) WITH PROPOFOL;  Surgeon: Lin Landsman, MD;  Location: [facility] ENDOSCOPY;  Service: Gastroenterology;  Laterality: N/A;   LEFT HEART CATH AND CORONARY ANGIOGRAPHY N/A 09/08/2021   Procedure: LEFT HEART CATH AND CORONARY ANGIOGRAPHY;  Surgeon:  Dionisio David, MD;  Location: [place] [facility];  Service: Cardiovascular;  Laterality: N/A;   PERIPHERAL VASCULAR INTERVENTION  10/15/2021   Procedure: PERIPHERAL VASCULAR INTERVENTION;  Surgeon: Wellington Hampshire, MD;  Location: [place] [facility];  Service: Cardiovascular;;   TONGUE SURGERY      Current Outpatient Medications  Medication Sig Dispense Refill   atorvastatin (LIPITOR) 80 MG tablet Take 1 tablet (80 mg total) by mouth daily. 90 tablet 3   buPROPion (WELLBUTRIN SR) 150 MG 12 hr tablet Take 1 tablet (150 mg total) by mouth 2 (two) times daily. Take 1 tablet daily for the first three days. Then take 1 tablet twice daily. 180 tablet 3   clopidogrel (PLAVIX) 75 MG tablet Take 1 tablet (75 mg total) by mouth daily. 90 tablet 3   famotidine (PEPCID) 20 MG tablet Take 20 mg by mouth daily.     gabapentin (NEURONTIN) 300 MG capsule Take 300 mg by mouth 3 (three) times daily.     hydrOXYzine (ATARAX) 25 MG tablet Take 25 mg by mouth every 6 (six) hours as needed.     levETIRAcetam (KEPPRA) 500 MG tablet Take 500 mg by mouth 2 (two) times daily.     losartan-hydrochlorothiazide (HYZAAR) 50-12.5 MG tablet Take 1 tablet by mouth daily.     metoprolol tartrate (LOPRESSOR) 25 MG tablet Take 0.5 tablets (12.5 mg total) by mouth 2 (two) times daily. 90 tablet 3   omeprazole (PRILOSEC) 40 MG capsule Take 1 capsule (40 mg total) by mouth daily. 90 capsule 3   methocarbamol (ROBAXIN) 750 MG tablet Take 750 mg by mouth every 6 (six) hours as needed for muscle spasms. (Patient not taking: Reported on 06/08/2022)     No current facility-administered medications for this visit.    Allergies:   Amoxicill-clarithro-lansopraz and Amoxicillin   Social History:  The patient  reports that she has been smoking cigarettes. She has a 17.50 pack-year smoking history. She has quit using smokeless tobacco. She reports that she does not currently use alcohol after a past usage of about 42.0 standard drinks of alcohol per week. She reports that she does not currently use drugs after having used the following drugs: Marijuana.   Family History:   family history includes Aneurysm in her sister; Cancer in her father and mother; Stroke in her  sister.    Review of Systems: Review of Systems  Constitutional: Negative.   HENT: Negative.    Respiratory: Negative.    Cardiovascular: Negative.   Gastrointestinal: Negative.   Musculoskeletal: Negative.   Skin:  Positive for rash.  Neurological: Negative.   Psychiatric/Behavioral: Negative.    All other systems reviewed and are negative.    PHYSICAL EXAM: VS:  BP 120/80 (BP Location: Left Arm, Patient Position: Sitting, Cuff Size: Normal)   Pulse 69   Ht '5\' 2"'$  (1.575 m)   Wt 111 lb 6 oz (50.5 kg)   SpO2 96%   BMI 20.37 kg/m  , BMI Body mass index is 20.37 kg/m. GEN: Well nourished, well developed, in no acute distress HEENT: normal Neck: no JVD, carotid bruits, or masses Cardiac: RRR; no murmurs, rubs, or gallops,no edema  Respiratory:  clear to auscultation bilaterally, normal work of breathing GI: soft, nontender, nondistended, + BS MS: no deformity or atrophy Skin: warm and dry, no rash Neuro:  Strength and sensation are intact Psych: euthymic mood, full affect   Recent Labs: 09/07/2021: B Natriuretic Peptide 30.4 09/08/2021: TSH 4.241 10/30/2021: ALT  18; Magnesium 1.9 11/02/2021: BUN 6; Creatinine, Ser 0.62; Potassium 3.4; Sodium 133 01/26/2022: Hemoglobin 11.2; Platelets 384    Lipid Panel Lab Results  Component Value Date   CHOL 177 09/09/2021   HDL 95 09/09/2021   LDLCALC 76 09/09/2021   TRIG 30 09/09/2021      Wt Readings from Last 3 Encounters:  06/08/22 111 lb 6 oz (50.5 kg)  03/11/22 104 lb (47.2 kg)  03/03/22 110 lb (49.9 kg)       ASSESSMENT AND PLAN:  Problem List Items Addressed This Visit       Cardiology Problems   Essential hypertension   PAD (peripheral artery disease) ([place])   Coronary artery disease involving native coronary artery of native heart without angina pectoris - Primary   Superior mesenteric artery stenosis (HCC)     Other   PUD (peptic ulcer disease)   Other Visit Diagnoses     Hyperlipidemia, unspecified  hyperlipidemia type       Tobacco use       Upper GI bleed       Epistaxis       Syncope and collapse          Coronary disease with stable angina Having periodic stabbing chest pain, somewhat atypical, shortness of breath on walking up hills which is new for her.  Similar symptoms prior to stent placement Recommended Myoview for further evaluation  Rash Etiology unclear, if symptoms persist may need to try alternate antiplatelet therapy  Hyperlipidemia Recommend she add Zetia to her Lipitor 80 daily to achieve goal LDL less than 70  Smoking Long discussion, Chantix sent in for smoking cessation efforts  PAD Reports having claudication type symptoms Prior stenting to lower extremities, continues to smoke Will arrange follow-up with Dr. Fletcher Anon, her primary cardiologist   Total encounter time more than 40 minutes  Greater than 50% was spent in counseling and coordination of care with the patient    Signed, Esmond Plants, M.D., Ph.D. [place], [place]

## 2022-06-08 ENCOUNTER — Encounter: Payer: Self-pay | Admitting: Cardiovascular Disease

## 2022-06-08 ENCOUNTER — Ambulatory Visit: Payer: Commercial Managed Care - HMO | Attending: Cardiovascular Disease | Admitting: Cardiovascular Disease

## 2022-06-08 VITALS — BP 120/80 | HR 69 | Ht 62.0 in | Wt 111.4 lb

## 2022-06-08 DIAGNOSIS — R040 Epistaxis: Secondary | ICD-10-CM

## 2022-06-08 DIAGNOSIS — Z720 Tobacco use: Secondary | ICD-10-CM | POA: Diagnosis not present

## 2022-06-08 DIAGNOSIS — E785 Hyperlipidemia, unspecified: Secondary | ICD-10-CM

## 2022-06-08 DIAGNOSIS — I10 Essential (primary) hypertension: Secondary | ICD-10-CM

## 2022-06-08 DIAGNOSIS — K922 Gastrointestinal hemorrhage, unspecified: Secondary | ICD-10-CM

## 2022-06-08 DIAGNOSIS — I739 Peripheral vascular disease, unspecified: Secondary | ICD-10-CM | POA: Diagnosis not present

## 2022-06-08 DIAGNOSIS — K551 Chronic vascular disorders of intestine: Secondary | ICD-10-CM

## 2022-06-08 DIAGNOSIS — I25118 Atherosclerotic heart disease of native coronary artery with other forms of angina pectoris: Secondary | ICD-10-CM | POA: Diagnosis not present

## 2022-06-08 DIAGNOSIS — K279 Peptic ulcer, site unspecified, unspecified as acute or chronic, without hemorrhage or perforation: Secondary | ICD-10-CM

## 2022-06-08 DIAGNOSIS — R55 Syncope and collapse: Secondary | ICD-10-CM

## 2022-06-08 MED ORDER — VARENICLINE TARTRATE 1 MG PO TABS: 1.0000 mg | ORAL_TABLET | Freq: Two times a day (BID) | ORAL | 4 refills | Status: DC

## 2022-06-08 MED ORDER — EZETIMIBE 10 MG PO TABS: 10.0000 mg | ORAL_TABLET | Freq: Every day | ORAL | 3 refills | Status: DC

## 2022-06-08 NOTE — Patient Instructions (Addendum)
Medication Instructions:  Please start zetia 10 mg daily Please start chantix 0.5 mg twice a day for 2 weeks then up to 1 mg twice a day  If you need a refill on your cardiac medications before your next appointment, please call your pharmacy.   Lab work: No new labs needed  Testing/Procedures: Your physician has requested that you have a lexiscan myoview. For further information please visit [email or web address]. Please follow instruction sheet, as given.  Please see instruction below  Follow-Up: At [facility], you and your health needs are our priority.  As part of our continuing mission to provide you with exceptional heart care, we have created designated Provider Care Teams.  These Care Teams include your primary Cardiologist (physician) and Advanced Practice Providers (APPs -  Physician Assistants and Nurse Practitioners) who all work together to provide you with the care you need, when you need it.  You will need a follow up appointment in 3 months with Dr. Fletcher Anon  Providers on your designated Care Team:   Murray Hodgkins, NP Christell Faith, PA-C Cadence Kathlen Mody, [place]  COVID-19 Vaccine Information can be found at: [email or web address] For questions related to vaccine distribution or appointments, please email [email or web address] or call [phone].   Your provider has ordered a Lexiscan/ Exercise Myoview Stress test. This will take place at [facility] Please report to the [facility] medical mall entrance. The volunteers at the first desk will direct you where to go.  [place]  Your provider has ordered a Stress Test with nuclear imaging. The purpose of this test is to evaluate the blood supply to your heart muscle. This procedure is referred to as a "Non-Invasive Stress Test." This is because other than having an IV started in your vein, nothing is inserted or "invades" your body. Cardiac stress tests are done to find areas of  poor blood flow to the heart by determining the extent of coronary artery disease (CAD). Some patients exercise on a treadmill, which naturally increases the blood flow to your heart, while others who are unable to walk on a treadmill due to physical limitations will have a pharmacologic/chemical stress agent called Lexiscan . This medicine will mimic walking on a treadmill by temporarily increasing your coronary blood flow.   Please note: these test may take anywhere between 2-4 hours to complete  How to prepare for your Myoview test:  Nothing to eat for 6 hours prior to the test No caffeine for 24 hours prior to test No smoking 24 hours prior to test. Your medication may be taken with water.  If your doctor stopped a medication because of this test, do not take that medication. Ladies, please do not wear dresses.  Skirts or pants are appropriate. Please wear a short sleeve shirt. No perfume, cologne or lotion. Wear comfortable walking shoes. No heels!   PLEASE NOTIFY THE OFFICE AT LEAST 28 HOURS IN ADVANCE IF YOU ARE UNABLE TO KEEP YOUR APPOINTMENT.  [phone] AND  PLEASE NOTIFY NUCLEAR MEDICINE AT [facility] AT LEAST 24 HOURS IN ADVANCE IF YOU ARE UNABLE TO KEEP YOUR APPOINTMENT. [phone]

## 2022-06-12 ENCOUNTER — Encounter
Admission: RE | Admit: 2022-06-12 | Discharge: 2022-06-12 | Disposition: A | Discharge status: Home / Self Care | Payer: Commercial Managed Care - HMO | Source: Ambulatory Visit | Attending: Cardiovascular Disease | Admitting: Cardiovascular Disease

## 2022-06-12 DIAGNOSIS — I25118 Atherosclerotic heart disease of native coronary artery with other forms of angina pectoris: Secondary | ICD-10-CM | POA: Diagnosis present

## 2022-06-12 MED ORDER — TECHNETIUM TC 99M TETROFOSMIN IV KIT
10.5900 | PACK | Freq: Once | INTRAVENOUS | Status: AC | PRN
  Administered 2022-06-12: 10.59 via INTRAVENOUS

## 2022-06-12 MED ORDER — REGADENOSON 0.4 MG/5ML IV SOLN
0.4000 mg | Freq: Once | INTRAVENOUS | Status: AC
  Administered 2022-06-12: 0.4 mg via INTRAVENOUS

## 2022-06-12 MED ORDER — TECHNETIUM TC 99M TETROFOSMIN IV KIT
30.8000 | PACK | Freq: Once | INTRAVENOUS | Status: AC | PRN
  Administered 2022-06-12: 30.8 via INTRAVENOUS

## 2022-06-17 LAB — NM MYOCAR MULTI W/SPECT W/WALL MOTION / EF
LV dias vol: 37 mL (ref 46–106)
LV sys vol: 12 mL
MPHR: 171 {beats}/min
Nuc Stress EF: 68 %
Peak HR: 114 {beats}/min
Percent HR: 66 %
Rest HR: 58 {beats}/min
Rest Nuclear Isotope Dose: 10.6 mCi
SDS: 2
SRS: 0
SSS: 1
Stress Nuclear Isotope Dose: 30.8 mCi
TID: 1

## 2022-06-25 ENCOUNTER — Telehealth: Payer: Self-pay | Admitting: Cardiovascular Disease

## 2022-06-25 NOTE — Telephone Encounter (Signed)
Pt would like to know is she is able to ride the rides at amusement park being that she has a Stent. Please advise

## 2022-06-26 NOTE — Telephone Encounter (Signed)
Spoke with patient and informed her of the provider's recommendation that it is fine for her to get on rides at an amusement park. Patient voiced understanding with read back.

## 2022-06-26 NOTE — Telephone Encounter (Signed)
Yes, that is fine. 

## 2022-07-15 ENCOUNTER — Ambulatory Visit: Payer: Self-pay | Attending: Cardiovascular Disease

## 2022-07-15 ENCOUNTER — Ambulatory Visit: Payer: Self-pay

## 2022-07-18 ENCOUNTER — Other Ambulatory Visit: Payer: Self-pay | Admitting: Cardiology

## 2022-07-20 NOTE — Telephone Encounter (Signed)
Please advise If ok to refill non cardiac medication.   

## 2022-07-21 NOTE — Telephone Encounter (Signed)
According to the last visit note where she saw Dr Mariah Milling he was sending in Chantix. I would not continue the both of them. If her PCP wanted to continue both I would leave that up to them.

## 2022-08-13 MED ORDER — CLOPIDOGREL BISULFATE 75 MG PO TABS: 75.0000 mg | ORAL_TABLET | Freq: Every day | ORAL | 3 refills | Status: AC

## 2022-08-13 MED ORDER — OMEPRAZOLE 40 MG PO CPDR: 40.0000 mg | DELAYED_RELEASE_CAPSULE | Freq: Every day | ORAL | 3 refills | Status: DC

## 2022-09-10 ENCOUNTER — Encounter: Payer: Self-pay | Admitting: Cardiovascular Disease

## 2022-09-16 ENCOUNTER — Ambulatory Visit: Payer: Self-pay | Admitting: Cardiovascular Disease

## 2022-09-17 ENCOUNTER — Other Ambulatory Visit: Payer: Self-pay

## 2022-09-17 ENCOUNTER — Emergency Department
Admission: EM | Admit: 2022-09-17 | Discharge: 2022-09-17 | Disposition: A | Discharge status: Home / Self Care | Payer: 59 | Attending: Emergency Medicine | Admitting: Emergency Medicine

## 2022-09-17 ENCOUNTER — Emergency Department: Payer: 59

## 2022-09-17 DIAGNOSIS — R0789 Other chest pain: Secondary | ICD-10-CM | POA: Insufficient documentation

## 2022-09-17 DIAGNOSIS — R0602 Shortness of breath: Secondary | ICD-10-CM | POA: Diagnosis not present

## 2022-09-17 DIAGNOSIS — I2510 Atherosclerotic heart disease of native coronary artery without angina pectoris: Secondary | ICD-10-CM | POA: Diagnosis not present

## 2022-09-17 DIAGNOSIS — R200 Anesthesia of skin: Secondary | ICD-10-CM | POA: Diagnosis not present

## 2022-09-17 DIAGNOSIS — R079 Chest pain, unspecified: Secondary | ICD-10-CM | POA: Diagnosis not present

## 2022-09-17 LAB — CBC
HCT: 39.3 % (ref 36.0–46.0)
Hemoglobin: 13.6 g/dL (ref 12.0–15.0)
MCH: 33.3 pg (ref 26.0–34.0)
MCHC: 34.6 g/dL (ref 30.0–36.0)
MCV: 96.1 fL (ref 80.0–100.0)
Platelets: 351 10*3/uL (ref 150–400)
RBC: 4.09 MIL/uL (ref 3.87–5.11)
RDW: 13 % (ref 11.5–15.5)
WBC: 7.7 10*3/uL (ref 4.0–10.5)
nRBC: 0 % (ref 0.0–0.2)

## 2022-09-17 LAB — BASIC METABOLIC PANEL
Anion gap: 12 (ref 5–15)
BUN: 7 mg/dL (ref 6–20)
CO2: 21 mmol/L — ABNORMAL LOW (ref 22–32)
Calcium: 8.8 mg/dL — ABNORMAL LOW (ref 8.9–10.3)
Chloride: 98 mmol/L (ref 98–111)
Creatinine, Ser: 0.66 mg/dL (ref 0.44–1.00)
GFR, Estimated: >60 mL/min (ref >60)
Glucose, Bld: 108 mg/dL — ABNORMAL HIGH (ref 70–99)
Potassium: 3.4 mmol/L — ABNORMAL LOW (ref 3.5–5.1)
Sodium: 131 mmol/L — ABNORMAL LOW (ref 135–145)

## 2022-09-17 LAB — TROPONIN I (HIGH SENSITIVITY): Troponin I (High Sensitivity): 4 ng/L (ref <18)

## 2022-09-17 NOTE — ED Triage Notes (Signed)
Pt presents to ED with c/o of SOB and CP since yesterday. Pt endorses diarrhea. Granddaughter recently Dx'ed with strept throat, pt denies throat pain or fevers or chills. NAD noted.

## 2022-09-17 NOTE — ED Provider Notes (Signed)
[facility] Provider Note    Event Date/Time   First MD Initiated Contact with Patient 09/17/22 1619     (approximate)   History   Chest Pain and Shortness of Breath   HPI  Sonya Harmon is a 52 y.o. female with history of coronary artery disease, peripheral arterial disease who presents with chest discomfort, mild shortness of breath which she reports has been ongoing for couple of days now and has been consistent.  Denies fevers chills although has had some night sweats.  No cough reported.  No change with exertion.  No calf pain or lower extremity swelling     Physical Exam   Triage Vital Signs: ED Triage Vitals  Enc Vitals Group     BP 09/17/22 1547 129/88     Pulse Rate 09/17/22 1547 72     Resp 09/17/22 1547 17     Temp 09/17/22 1547 97.7 F (36.5 C)     Temp Source 09/17/22 1547 Oral     SpO2 09/17/22 1547 100 %     Weight 09/17/22 1620 50.5 kg (111 lb 5.3 oz)     Height 09/17/22 1620 1.575 m (5\' 2" )     Head Circumference --      Peak Flow --      Pain Score 09/17/22 1556 4     Pain Loc --      Pain Edu? --      Excl. in GC? --     Most recent vital signs: Vitals:   09/17/22 1547  BP: 129/88  Pulse: 72  Resp: 17  Temp: 97.7 F (36.5 C)  SpO2: 100%     General: Awake, no distress.  CV:  Good peripheral perfusion.  Regular rate and rhythm Resp:  Normal effort.  Clear to auscultation bilaterally Abd:  No distention.  Other:  No calf pain or swelling   ED Results / Procedures / Treatments   Labs (all labs ordered are listed, but only abnormal results are displayed) Labs Reviewed  BASIC METABOLIC PANEL - Abnormal; Notable for the following components:      Result Value   Sodium 131 (*)    Potassium 3.4 (*)    CO2 21 (*)    Glucose, Bld 108 (*)    Calcium 8.8 (*)    All other components within normal limits  CBC  TROPONIN I (HIGH SENSITIVITY)     EKG  ED ECG REPORT I, Jene Every, the attending physician,  personally viewed and interpreted this ECG.  Date: 09/17/2022  Rhythm: normal sinus rhythm QRS Axis: normal Intervals: normal ST/T Wave abnormalities: Nonspecific changes Narrative Interpretation: No change from prior EKG    RADIOLOGY X-ray viewed interpret by me, no acute abnormality    PROCEDURES:  Critical Care performed:   Procedures   MEDICATIONS ORDERED IN ED: Medications - No data to display   IMPRESSION / MDM / ASSESSMENT AND PLAN / ED COURSE  I reviewed the triage vital signs and the nursing notes. Patient's presentation is most consistent with acute presentation with potential threat to life or bodily function.  Patient with a history of CAD presents with chest pain shortness of breath  Differential includes ACS, angina, pneumonia, musculoskeletal pain  Overall well-appearing in no acute distress, EKG is unchanged from prior, high sensitive troponin is normal which is quite reassuring, not consistent with ACS  No evidence of pneumonia or pneumothorax or edema on chest x-ray  Patient feeling better after evaluation,  reassured by lab results, no indication for admission at this time, appropriate for discharge with close follow-up with cardiology, strict return precautions discussed, patient agrees this plan        FINAL CLINICAL IMPRESSION(S) / ED DIAGNOSES   Final diagnoses:  Atypical chest pain     Rx / DC Orders   ED Discharge Orders     None        Note:  This document was prepared using Dragon voice recognition software and may include unintentional dictation errors.   Jene Every, MD 09/17/22 [phone]

## 2022-09-18 ENCOUNTER — Encounter: Payer: Self-pay | Admitting: Cardiology

## 2022-09-18 ENCOUNTER — Ambulatory Visit: Payer: 59 | Attending: Cardiology | Admitting: Cardiology

## 2022-09-18 ENCOUNTER — Ambulatory Visit (INDEPENDENT_AMBULATORY_CARE_PROVIDER_SITE_OTHER): Payer: 59

## 2022-09-18 VITALS — BP 120/80 | HR 91 | Ht 62.0 in | Wt 107.4 lb

## 2022-09-18 DIAGNOSIS — R0602 Shortness of breath: Secondary | ICD-10-CM

## 2022-09-18 DIAGNOSIS — I739 Peripheral vascular disease, unspecified: Secondary | ICD-10-CM | POA: Diagnosis not present

## 2022-09-18 DIAGNOSIS — I10 Essential (primary) hypertension: Secondary | ICD-10-CM

## 2022-09-18 DIAGNOSIS — R002 Palpitations: Secondary | ICD-10-CM | POA: Diagnosis not present

## 2022-09-18 DIAGNOSIS — E785 Hyperlipidemia, unspecified: Secondary | ICD-10-CM

## 2022-09-18 DIAGNOSIS — Z720 Tobacco use: Secondary | ICD-10-CM | POA: Diagnosis not present

## 2022-09-18 DIAGNOSIS — I25118 Atherosclerotic heart disease of native coronary artery with other forms of angina pectoris: Secondary | ICD-10-CM | POA: Diagnosis not present

## 2022-09-18 DIAGNOSIS — K922 Gastrointestinal hemorrhage, unspecified: Secondary | ICD-10-CM

## 2022-09-18 MED ORDER — METOPROLOL TARTRATE 25 MG PO TABS: 25.0000 mg | ORAL_TABLET | Freq: Two times a day (BID) | ORAL | 3 refills | Status: DC

## 2022-09-18 NOTE — Patient Instructions (Addendum)
Medication Instructions:  Your physician has recommended you make the following change in your medication:  START - metoprolol tartrate (LOPRESSOR) 25 MG tablet - Take 1 tablet (25 mg total) by mouth 2 (two) times daily  *If you need a refill on your cardiac medications before your next appointment, please call your pharmacy*  Lab Work: Your physician recommends that you get lab work : Fasting lipid Comptroller at [facility] 1st desk on the right to check in (REGISTRATION)  Lab hours: Monday- Friday (7:30 am- 5:30 pm)  If you have labs (blood work) drawn today and your tests are completely normal, you will receive your results only by: MyChart Message (if you have MyChart) OR A paper copy in the mail If you have any lab test that is abnormal or we need to change your treatment, we will call you to review the results.  Testing/Procedures: Your physician has requested that you have an echocardiogram. Echocardiography is a painless test that uses sound waves to create images of your heart. It provides your doctor with information about the size and shape of your heart and how well your heart's chambers and valves are working. This procedure takes approximately one hour. There are no restrictions for this procedure. Please do NOT wear cologne, perfume, aftershave, or lotions (deodorant is allowed). Please arrive 15 minutes prior to your appointment time.   Heart Monitor:  Your physician has requested you wear a ZIOXT monitor for 14 days.  Your monitor will be mailed to your home address within 3-5 business days. This is sent via [employer] from [employer]. However, if you have not received your monitor after 5 business days please send [place] a MyChart message or call the office at [phone], so we may follow up on this for you.   This monitor is a medical device (single patch monitor) that records the heart's electrical activity. Doctors most often use these monitors to  diagnose arrhythmias. Arrhythmias are problems with the speed or rhythm of the heartbeat.   [employer] supplies 1 patch per enrollment. Additional stickers are not available.  Please DO NOT apply the patch if you will be having a Nuclear Stress Test, Echocardiogram, Cardiac CT, Cardiac MRI, Chest X-ray during the period you would be wearing the monitor. The patch cannot be worn during these tests.  You cannot remove and re-apply the ZIO patch monitor.   Applying the Monitor: Once you receive your monitor, this will include a small razor, abrader, and 4 alcohol pads. Shave hair from upper left chest Rub abrader disc in 40 strokes over the left upper chest as indicated in your monitor instructions Clean area with 4 enclosed alcohol pads (there may be a mild & brief stinging sensation over the newly abraded area, but this is normal). Let dry Apply patch as indicated in monitor instructions. Patch will be placed under collarbone on the left side of the chest with arrow pointing upward. Rub adhesive wings for 2 minutes. Remove white label marked "1". Remove the white label marked "2". Rub patch adhesive wings for an 2 minutes.  While looking in a mirror, press and release button in the center of the patch. You may hear a "click". A small green light will flash 4-6 times and then stop. This will be your indicator that the monitor has been turned on.  Wearing the Monitor: Avoid showering during the first 24 hours of wearing the monitor.  After 24 hours you may shower with the patch  on. Take brief showers with your back facing the shower head.  Avoid excessive sweating to help maximize wear time. Do not submerge the device, no hot tubs, and no swimming pools. Keep any lotions or oils away from the patch. Press the button if you feel a symptom. You will hear a small click. Record date, time, and symptoms in the Patient Logbook or App.  Monitor Issues: Call [employer] Customer Care at  [phone] if you have questions regarding your Zio Patch Monitor. Call them immediately if you see an orange/ amber colored light blinking on your monitor. If your monitor falls off and you cannot get this reapplied or if you need suggestions for securing your monitor call [employer] at [phone].   Returning the Monitor: Once you have completed wearing your monitor, follow instructions on the last 2 pages of the Patient Logbook. Stick monitor patch on to the last page of the Patient Logbook.  Place Patient Logbook with monitor in the return box provided. Use locking tab on box and tape box closed securely. The return box has pre-paid postage on it.  Place the return box in the regular [place] Mail box as soon as possible It will take anywhere from 1-2 weeks for your provider to receive and review your results once you mail this back. If for some reason you have misplaced your return box then call our office and we can provide another box and/or mail it off for you.   Billing  and Patient Assistance Program Information: We have supplied [employer] with any of your insurance information on file for billing purposes. [employer] offers a sliding scale Patient Assistance Program for patients that do not have insurance, or whose insurance does not completely cover the cost of the ZIO monitor. You must apply for the Patient Assistance Program to qualify for this discounted rate. To apply, please call [employer] at [phone], select option 1, ask to apply for the Patient Assistance Program. [employer] will ask your household income, and how many people are in your household. They will quote your out-of-pocket cost based on that information. [employer] will also be able to set up for a 25-month, interest-free payment plan if needed.     Follow-Up: At [facility], you and your health needs are our priority.  As part of our continuing mission to provide you with exceptional heart care, we have created  designated Provider Care Teams.  These Care Teams include your primary Cardiologist (physician) and Advanced Practice Providers (APPs -  Physician Assistants and Nurse Practitioners) who all work together to provide you with the care you need, when you need it.  Your next appointment:   6 - 8 week(s)  Provider:   Lorine Bears, MD    Other Instructions Reschedule arterial duplex

## 2022-09-18 NOTE — Progress Notes (Unsigned)
  Cardiology Office Note:  .   Date:  09/18/2022  ID:  Sonya Harmon, DOB 1971/01/27, MRN [id number] PCP: Sonya Ravel, MD  Sonya Harmon Providers Cardiologist:  Sonya Nordmann, MD { Click to update primary MD,subspecialty MD or APP then REFRESH:1}   History of Present Illness: .   Sonya Harmon is a 52 y.o. female with a past medical history of coronary artery disease, peripheral artery disease, history of tongue cancer, tobacco use/alcohol abuse, and hyperlipidemia, who is here today for follow-up of coronary artery disease.  She was originally hospitalized 09/2020 with unstable angina and cardiac cath completed by Dr. Coronary from the right femoral artery which showed severe RCA stenosis.  PCI was planned but the patient had a large groin hematoma.  She did undergo staged PCI via the right radial artery as well as noted to have significant bilateral iliac disease as well.  On 10/10/2021 she was seen for syncopal episode and she was evaluated in the emergency department.  Troponin was negative x 2, D-dimer was normal, she was given a liter of fluid and was discharged.  Labs showed stable anemia with hemoglobin 10.7. She underwent lower extremity angiography on 10/15/2021 which showed severely calcified aorta to obstructive disease.  She underwent successful DES placement to the left external iliac artery with drug-coated balloon angioplasty to the distal left common iliac artery.  A stent was not placed in an effort to avoid jailing of the internal iliac artery which was severely diseased at the ostium.  Postprocedure duplex revealed abnormal iliac velocities and her claudication had resolved.  She then presented to [facility] emergency department 01/21/2022 for epistaxis.  She states that she had woken up with the right nare bleeding and had been holding pressure with little success. She had topical application of lidocaine gel and packing was placed.  The bleeding.  And she was placed on antibiotics  and recommended to follow-up with ENT.   ROS: ***  Studies Reviewed: Marland Kitchen    EKG:  ***  *** Risk Assessment/Calculations:   {Does this patient have ATRIAL FIBRILLATION?[phone]}         Physical Exam:   VS:  There were no vitals taken for this visit.   Wt Readings from Last 3 Encounters:  09/17/22 111 lb 5.3 oz (50.5 kg)  06/08/22 111 lb 6 oz (50.5 kg)  03/11/22 104 lb (47.2 kg)    GEN: Well nourished, well developed in no acute distress NECK: No JVD; No carotid bruits CARDIAC: ***RRR, no murmurs, rubs, gallops RESPIRATORY:  Clear to auscultation without rales, wheezing or rhonchi  ABDOMEN: Soft, non-tender, non-distended EXTREMITIES:  No edema; No deformity   ASSESSMENT AND PLAN: .   ***    {Are you ordering a CV Procedure (e.g. stress test, cath, DCCV, TEE, etc)?   Press F2        :[id number]}  Dispo: ***  Signed, Sonya Mastel, NP

## 2022-09-21 DIAGNOSIS — R002 Palpitations: Secondary | ICD-10-CM | POA: Diagnosis not present

## 2022-09-28 ENCOUNTER — Telehealth: Payer: Self-pay | Admitting: Cardiovascular Disease

## 2022-09-28 NOTE — Telephone Encounter (Signed)
Spoke to patient and she stated that she has been feeling dizzy lately and stated that her BP has been running extremely low. Patient stated that her last few BP readings were as follows:  72/55  HR 122 (just before call) 75/53  HR 112 75/53  HR 65 75/54  HR 125  Instructed patient to call 911 or have a family member drive her to the Emergency Department immediately. Patient stated that she would have her daughter take her to the ED immediately.

## 2022-09-28 NOTE — Telephone Encounter (Signed)
Pt c/o BP issue: STAT if pt c/o blurred vision, one-sided weakness or slurred speech  1. What are your last 5 BP readings?   72/55  HR 122 (just before call) 75/53  HR 112 75/53  HR 65 75/54  HR 125  2. Are you having any other symptoms (ex. Dizziness, headache, blurred vision, passed out)?  Dizziness, headache  3. What is your BP issue?   Patient stated this morning her BP and HR was reading normal. Patient stated symptoms started yesterday around 4:00 pm while at work.

## 2022-10-05 ENCOUNTER — Telehealth: Payer: Self-pay | Admitting: Cardiovascular Disease

## 2022-10-05 NOTE — Telephone Encounter (Signed)
Call pt and pt's spouse and left a message to reschedule the vascular sooner at [facility]

## 2022-10-05 NOTE — Telephone Encounter (Signed)
The patient called with complaints of having red toes on her right foot after standing for long periods of time. 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 At rest, they are not as red nor painful.  She stated that she has also developed bilateral pain when she ambulates. The pain is a burning sensation that radiates from her buttocks to her feet.   She currently takes Plavix 75 mg once daily  She has dopplers on 8/8. Messaged has been sent to scheduling to see if this can be done sooner.

## 2022-10-05 NOTE — Telephone Encounter (Signed)
STAT if patient feels like he/she is going to faint   Are you dizzy now?  No  Do you feel faint or have you passed out?   No  Do you have any other symptoms?   Have you checked your HR and BP (record if available)?   Not available   Patient stated she is concerned about the circulation in her feet because the tops of her toes are red and hurt.  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

## 2022-10-05 NOTE — Telephone Encounter (Signed)
I agree with expediting her Doppler studies and follow-up shortly after.

## 2022-10-07 ENCOUNTER — Encounter: Payer: Self-pay | Admitting: Cardiovascular Disease

## 2022-10-07 ENCOUNTER — Ambulatory Visit: Payer: 59 | Attending: Cardiovascular Disease | Admitting: Cardiovascular Disease

## 2022-10-07 VITALS — BP 122/74 | HR 72 | Ht 62.0 in | Wt 111.0 lb

## 2022-10-07 DIAGNOSIS — Z720 Tobacco use: Secondary | ICD-10-CM

## 2022-10-07 DIAGNOSIS — R002 Palpitations: Secondary | ICD-10-CM | POA: Diagnosis not present

## 2022-10-07 DIAGNOSIS — I2510 Atherosclerotic heart disease of native coronary artery without angina pectoris: Secondary | ICD-10-CM

## 2022-10-07 DIAGNOSIS — I739 Peripheral vascular disease, unspecified: Secondary | ICD-10-CM

## 2022-10-07 DIAGNOSIS — E785 Hyperlipidemia, unspecified: Secondary | ICD-10-CM

## 2022-10-07 NOTE — Patient Instructions (Signed)
Medication Instructions:  No changes *If you need a refill on your cardiac medications before your next appointment, please call your pharmacy*   Lab Work: None ordered If you have labs (blood work) drawn today and your tests are completely normal, you will receive your results only by: MyChart Message (if you have MyChart) OR A paper copy in the mail If you have any lab test that is abnormal or we need to change your treatment, we will call you to review the results.   Testing/Procedures: LEA and Aorta Dopplers to be rescheduled for sooner date   Follow-Up: At [facility], you and your health needs are our priority.  As part of our continuing mission to provide you with exceptional heart care, we have created designated Provider Care Teams.  These Care Teams include your primary Cardiologist (physician) and Advanced Practice Providers (APPs -  Physician Assistants and Nurse Practitioners) who all work together to provide you with the care you need, when you need it.  We recommend signing up for the patient portal called "MyChart".  Sign up information is provided on this After Visit Summary.  MyChart is used to connect with patients for Virtual Visits (Telemedicine).  Patients are able to view lab/test results, encounter notes, upcoming appointments, etc.  Non-urgent messages can be sent to your provider as well.   To learn more about what you can do with MyChart, go to [email or web address].    Your next appointment:   6 month(s)  Provider:   Dr. Kirke Corin

## 2022-10-07 NOTE — Telephone Encounter (Signed)
Patient scheduled for a follow up with Dr. Kirke Corin 7/3. Dopplers will be scheduled at the appointment

## 2022-10-07 NOTE — Progress Notes (Signed)
Cardiology Office Note   Date:  10/07/2022   ID:  Sonya Harmon, DOB 11/08/1970, MRN [id number]  PCP:  Ailene Ravel, MD  Cardiologist:   Lorine Bears, MD   Chief Complaint  Patient presents with   Concerned with new bilateral LE redness and pain.    For past couple of days patient has noticed redness with pain in her feet and toes.  Also getting leg cramps during the night that improve with sitting.  Current every day smoker and has started Chantix on Monday.      History of Present Illness: Sonya Harmon is a 52 y.o. female who presents for a follow-up visit regarding coronary artery disease and peripheral arterial disease. She has history of tongue cancer, tobacco use, alcohol abuse and hyperlipidemia. She was hospitalized in June, 2023 with unstable angina.  She underwent cardiac catheterization by Dr. Welton Flakes from the right femoral artery which showed severe mid RCA stenosis.  PCI was planned but patient had a large groin hematoma.  She was noted to have significant bilateral iliac disease.  She underwent staged RCA PCI via the right radial artery.    The patient had significant bilateral leg claudication due to iliac disease. Angiography was performed in July 2023 which showed severely calcified aorta with no obstructive disease.  There was severe ostial left renal artery stenosis, no significant iliac disease on the right side and severe left external iliac artery stenosis extending into the distal left common iliac artery.  I performed successful drug-eluting stent placement to the left external iliac artery and drug-coated balloon angioplasty to the distal left common iliac artery.  A stent was not placed there to avoid jailing the internal iliac artery which was severely diseased at the ostium.  Postprocedure duplex showed normal iliac velocities. Her leg claudication resolved.  She is here for follow-up visit.  She was hospitalized in July 2023 with syncope due to blood  loss anemia.  She also had epistaxis in October.  She had an emergency room visit in June for atypical chest pain with negative troponin.  She is concerned about recent symptoms of discomfort in her feet and redness especially with prolonged standing.  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  The symptoms are better in the morning and they do improve with leg elevation.   Past Medical History:  Diagnosis Date   Alcohol abuse    Coronary artery disease 09/2021   s/p PCI to RCA in setting of unstable angina   History of tongue cancer    PAD (peripheral artery disease) (HCC)    Tobacco abuse     Past Surgical History:  Procedure Laterality Date   ABDOMINAL AORTOGRAM W/LOWER EXTREMITY N/A 10/15/2021   Procedure: ABDOMINAL AORTOGRAM W/LOWER EXTREMITY;  Surgeon: Iran Ouch, MD;  Location: [facility] INVASIVE CV LAB;  Service: Cardiovascular;  Laterality: N/A;   BILATERAL SALPINGECTOMY     COLONOSCOPY WITH PROPOFOL N/A 03/11/2022   Procedure: COLONOSCOPY WITH PROPOFOL;  Surgeon: Toney Reil, MD;  Location: [facility] ENDOSCOPY;  Service: Gastroenterology;  Laterality: N/A;   CORONARY BALLOON ANGIOPLASTY N/A 09/08/2021   Procedure: CORONARY BALLOON ANGIOPLASTY - ABORTED;  Surgeon: Iran Ouch, MD;  Location: [facility] INVASIVE CV LAB;  Service: Cardiovascular;  Laterality: N/A;   CORONARY STENT INTERVENTION N/A 09/10/2021   Procedure: CORONARY STENT INTERVENTION;  Surgeon: Iran Ouch, MD;  Location: [facility] INVASIVE CV LAB;  Service: Cardiovascular;  Laterality: N/A;   ESOPHAGOGASTRODUODENOSCOPY (EGD) WITH PROPOFOL N/A 11/01/2021   Procedure: ESOPHAGOGASTRODUODENOSCOPY (EGD) WITH PROPOFOL;  Surgeon: Toney Reil, MD;  Location[facility] ENDOSCOPY;  Service: Gastroenterology;  Laterality: N/A;   ESOPHAGOGASTRODUODENOSCOPY (EGD) WITH PROPOFOL N/A 03/11/2022   Procedure: ESOPHAGOGASTRODUODENOSCOPY (EGD) WITH PROPOFOL;  Surgeon: Toney Reil, MD;  Location:  [facility] ENDOSCOPY;  Service: Gastroenterology;  Laterality: N/A;   LEFT HEART CATH AND CORONARY ANGIOGRAPHY N/A 09/08/2021   Procedure: LEFT HEART CATH AND CORONARY ANGIOGRAPHY;  Surgeon: Laurier Nancy, MD;  Location: [facility] INVASIVE CV LAB;  Service: Cardiovascular;  Laterality: N/A;   PERIPHERAL VASCULAR INTERVENTION  10/15/2021   Procedure: PERIPHERAL VASCULAR INTERVENTION;  Surgeon: Iran Ouch, MD;  Location: [facility] INVASIVE CV LAB;  Service: Cardiovascular;;   TONGUE SURGERY       Current Outpatient Medications  Medication Sig Dispense Refill   atorvastatin (LIPITOR) 80 MG tablet Take 1 tablet (80 mg total) by mouth daily. 90 tablet 3   buPROPion (WELLBUTRIN SR) 150 MG 12 hr tablet Take 1 tablet (150 mg total) by mouth 2 (two) times daily. Take 1 tablet daily for the first three days. Then take 1 tablet twice daily. 180 tablet 3   clopidogrel (PLAVIX) 75 MG tablet Take 1 tablet (75 mg total) by mouth daily. 90 tablet 3   ezetimibe (ZETIA) 10 MG tablet Take 1 tablet (10 mg total) by mouth daily. 90 tablet 3   famotidine (PEPCID) 20 MG tablet Take 20 mg by mouth daily.     gabapentin (NEURONTIN) 300 MG capsule Take 300 mg by mouth 3 (three) times daily.     methocarbamol (ROBAXIN) 750 MG tablet Take 750 mg by mouth every 6 (six) hours as needed for muscle spasms.     metoprolol tartrate (LOPRESSOR) 25 MG tablet Take 1 tablet (25 mg total) by mouth 2 (two) times daily. 180 tablet 3   omeprazole (PRILOSEC) 40 MG capsule Take 1 capsule (40 mg total) by mouth daily. 90 capsule 3   varenicline (CHANTIX CONTINUING MONTH PAK) 1 MG tablet Take 1 tablet (1 mg total) by mouth 2 (two) times daily. 60 tablet 4   No current facility-administered medications for this visit.    Allergies:   Amoxicill-clarithro-lansopraz and Amoxicillin    Social History:  The patient  reports that she has been smoking cigarettes. She has a 17.50 pack-year smoking history. She has quit using smokeless tobacco. She  reports that she does not currently use alcohol after a past usage of about 42.0 standard drinks of alcohol per week. She reports that she does not currently use drugs after having used the following drugs: Marijuana.   Family History:  The patient's family history includes Aneurysm in her sister; Cancer in her father and mother; Stroke in her sister.    ROS:  Please see the history of present illness.   Otherwise, review of systems are positive for none.   All other systems are reviewed and negative.    PHYSICAL EXAM: VS:  BP 122/74 (BP Location: Left Arm, Patient Position: Sitting, Cuff Size: Normal)   Pulse 72   Ht 5\' 2"  (1.575 m)   Wt 111 lb (50.3 kg)   SpO2 98%   BMI 20.30 kg/m  , BMI Body mass index is 20.3 kg/m. GEN: Well-developed in no acute distress. HEENT: normal  Neck: no JVD, carotid bruits, or masses Cardiac: RRR; no murmurs, rubs, or gallops,no edema  Respiratory:  clear to auscultation bilaterally, normal work of breathing GI:  soft, nontender, nondistended, + BS MS: no deformity or atrophy  Skin: warm and dry, no rash Neuro:  Strength and sensation are intact Psych: Alert and oriented with normal affect. Vascular: Femoral pulses palpable bilaterally.  Dorsalis pedis and posterior tibial pulse is palpable bilaterally.   EKG:  EKG is not ordered today.    Recent Labs: 10/30/2021: ALT 18; Magnesium 1.9 09/17/2022: BUN 7; Creatinine, Ser 0.66; Hemoglobin 13.6; Platelets 351; Potassium 3.4; Sodium 131    Lipid Panel    Component Value Date/Time   CHOL 177 09/09/2021 0541   TRIG 30 09/09/2021 0541   HDL 95 09/09/2021 0541   CHOLHDL 1.9 09/09/2021 0541   VLDL 6 09/09/2021 0541   LDLCALC 76 09/09/2021 0541      Wt Readings from Last 3 Encounters:  10/07/22 111 lb (50.3 kg)  09/18/22 107 lb 6.4 oz (48.7 kg)  09/17/22 111 lb 5.3 oz (50.5 kg)           No data to display            ASSESSMENT AND PLAN:  1.  Chronic venous insufficiency: Her  current symptoms of mild swelling in her feet with discoloration and discomfort especially at the end of the day are consistent with chronic venous insufficiency and not claudication.  Her distal pulses are palpable. I advised her to try to elevate her legs frequently during the day and consider knee-high support stockings during the day.  2.  Peripheral arterial disease: Status post status post left iliac stent.  She is due for an ABI and aortoiliac duplex.  3.  Coronary artery disease involving native coronary arteries without angina: She had RCA stent done last year.  She continues to take clopidogrel.  4.  Tobacco use: She is trying to quit smoking with Chantix.  5.  Hyperlipidemia: Currently on atorvastatin 80 mg daily with a target LDL of less than 70.  Most recent lipid profile showed an LDL of 76.    Disposition: follow up with me in 6 months  Signed,  Lorine Bears, MD  10/07/2022 2:47 PM    [facility]

## 2022-10-13 ENCOUNTER — Other Ambulatory Visit: Payer: Self-pay | Admitting: Cardiology

## 2022-10-13 DIAGNOSIS — R002 Palpitations: Secondary | ICD-10-CM

## 2022-10-13 DIAGNOSIS — I10 Essential (primary) hypertension: Secondary | ICD-10-CM

## 2022-10-13 DIAGNOSIS — E785 Hyperlipidemia, unspecified: Secondary | ICD-10-CM

## 2022-10-13 DIAGNOSIS — I25118 Atherosclerotic heart disease of native coronary artery with other forms of angina pectoris: Secondary | ICD-10-CM

## 2022-10-13 DIAGNOSIS — Z720 Tobacco use: Secondary | ICD-10-CM

## 2022-10-13 DIAGNOSIS — R0602 Shortness of breath: Secondary | ICD-10-CM

## 2022-10-13 DIAGNOSIS — I739 Peripheral vascular disease, unspecified: Secondary | ICD-10-CM

## 2022-10-19 NOTE — Progress Notes (Signed)
Slowest heart rate 44 beats per minute with the fastest heart rate being 122 beats per minute, with an average heart rate of 67 bpm. There was one episode of SVT/atrial tachycardia that lasted for 19 beats from maximum heart rate of the 122 bpm.  There were 9 triggered events that were associated with normal sinus rhythm and only the 1 event associated with tachycardia as mentioned above.  No findings of atrial fibrillation or continuously elevated heart rates to explain palpitations.

## 2022-10-21 ENCOUNTER — Other Ambulatory Visit: Payer: Self-pay | Admitting: Cardiology

## 2022-10-21 DIAGNOSIS — M5030 Other cervical disc degeneration, unspecified cervical region: Secondary | ICD-10-CM | POA: Diagnosis not present

## 2022-10-21 DIAGNOSIS — I739 Peripheral vascular disease, unspecified: Secondary | ICD-10-CM

## 2022-10-21 DIAGNOSIS — M5020 Other cervical disc displacement, unspecified cervical region: Secondary | ICD-10-CM | POA: Diagnosis not present

## 2022-10-21 DIAGNOSIS — M5412 Radiculopathy, cervical region: Secondary | ICD-10-CM | POA: Diagnosis not present

## 2022-10-23 ENCOUNTER — Telehealth: Payer: Self-pay | Admitting: Cardiovascular Disease

## 2022-10-23 NOTE — Telephone Encounter (Signed)
   Patient Name: Sonya Harmon  DOB: 1971/02/02 MRN: [id number]  Primary Cardiologist: Julien Nordmann, MD  Chart reviewed as part of pre-operative protocol coverage. Given past medical history and time since last visit, based on ACC/AHA guidelines, Sonya Harmon is at acceptable risk for the planned procedure without further cardiovascular testing.   Per Dr. Kirke Corin, she may hold Plavix for 5 days prior to procedure. Please resume Plavix as soon as possible postprocedure, at the discretion of the surgeon.   I will route this recommendation to the requesting party via Epic fax function and remove from pre-op pool.  Please call with questions.  Joylene Grapes, NP 10/23/2022, 3:00 PM

## 2022-10-23 NOTE — Telephone Encounter (Signed)
   Pre-operative Risk Assessment    Patient Name: Sonya Harmon  DOB: 18-Sep-1970 MRN: [id number]      Request for Surgical Clearance    Procedure:   CERVICAL INTERIAMINAR EPIDURAL STEROID INJECTION  Date of Surgery:  Clearance TBD                                 Surgeon:  not indicated Surgeon's Group or Practice Name:  [facility] department of Physiatry Phone number:  [phone] Fax number:  [phone]   Type of Clearance Requested:   - Pharmacy:  Hold Clopidogrel (Plavix) 7 days prior   Type of Anesthesia:  Not Indicated   Additional requests/questions:    [name]   10/23/2022, 10:35 AM

## 2022-10-23 NOTE — Telephone Encounter (Signed)
Ok to hold Plavix 5 days before.

## 2022-10-27 ENCOUNTER — Other Ambulatory Visit: Payer: Self-pay | Admitting: Family Medicine

## 2022-10-27 DIAGNOSIS — M5412 Radiculopathy, cervical region: Secondary | ICD-10-CM

## 2022-10-28 ENCOUNTER — Encounter (HOSPITAL_COMMUNITY): Payer: Self-pay

## 2022-10-28 ENCOUNTER — Ambulatory Visit (HOSPITAL_COMMUNITY): Payer: 59 | Attending: Cardiology

## 2022-10-28 ENCOUNTER — Ambulatory Visit (HOSPITAL_COMMUNITY): Payer: 59

## 2022-10-29 ENCOUNTER — Ambulatory Visit: Payer: 59 | Attending: Cardiology

## 2022-10-29 DIAGNOSIS — I081 Rheumatic disorders of both mitral and tricuspid valves: Secondary | ICD-10-CM | POA: Diagnosis not present

## 2022-10-29 DIAGNOSIS — R0602 Shortness of breath: Secondary | ICD-10-CM

## 2022-10-29 LAB — ECHOCARDIOGRAM COMPLETE
Area-P 1/2: 3.91 cm2
S' Lateral: 2.5 cm

## 2022-11-02 NOTE — Progress Notes (Signed)
Heart squeeze 60-65%, there are no wall motion abnormalities noted, there is mild leakage in the mitral valve which is unchanged.  Overall reassuring study.

## 2022-11-12 ENCOUNTER — Other Ambulatory Visit: Payer: 59

## 2022-11-16 ENCOUNTER — Telehealth: Payer: Self-pay | Admitting: Cardiovascular Disease

## 2022-11-16 NOTE — Telephone Encounter (Signed)
   Pre-operative Risk Assessment    Patient Name: Sonya Harmon  DOB: October 13, 1970 MRN: [id number]      Request for Surgical Clearance    Procedure:   cervical injection  Date of Surgery:  Clearance TBD                                 Surgeon:  not indicated Surgeon's Group or Practice Name:  [facility] Phone number:  [phone] Fax number:  [phone]   Type of Clearance Requested:   - Pharmacy:  Hold Clopidogrel (Plavix) 5 days   Type of Anesthesia:  Local    Additional requests/questions:    Sonya Harmon   11/16/2022, 1:45 PM

## 2022-11-16 NOTE — Telephone Encounter (Signed)
NOTES HAVE BEEN RE-FAXED

## 2022-11-17 ENCOUNTER — Ambulatory Visit (HOSPITAL_COMMUNITY)
Admission: RE | Admit: 2022-11-17 | Discharge: 2022-11-17 | Disposition: A | Discharge status: Home / Self Care | Payer: 59 | Source: Ambulatory Visit | Attending: Cardiovascular Disease | Admitting: Cardiovascular Disease

## 2022-11-17 ENCOUNTER — Ambulatory Visit (HOSPITAL_BASED_OUTPATIENT_CLINIC_OR_DEPARTMENT_OTHER)
Admission: RE | Admit: 2022-11-17 | Discharge: 2022-11-17 | Disposition: A | Discharge status: Home / Self Care | Payer: 59 | Source: Ambulatory Visit | Attending: Cardiovascular Disease | Admitting: Cardiovascular Disease

## 2022-11-17 DIAGNOSIS — I739 Peripheral vascular disease, unspecified: Secondary | ICD-10-CM | POA: Insufficient documentation

## 2022-11-17 DIAGNOSIS — Z95820 Peripheral vascular angioplasty status with implants and grafts: Secondary | ICD-10-CM | POA: Insufficient documentation

## 2022-11-17 LAB — VAS US ABI WITH/WO TBI
Left ABI: 0.87
Right ABI: 0.9

## 2022-11-18 NOTE — Progress Notes (Signed)
Patent left iliac artery stent out areas of blockage.  Right common iliac revealed greater than 50% stenosis in the right external iliac revealed less than 50% stenosis.  The recommendation is to keep the follow-up that is already scheduled with Dr. Kirke Corin for her peripheral arterial disease.

## 2022-11-18 NOTE — Progress Notes (Signed)
ABIs completed on the right appear decreased on the previous study completed in 2023.  Left ABIs and bilateral TBI's appear to be essentially unchanged compared to the prior study.  Continue to keep vascular appointment as previously scheduled with Dr. Kirke Corin on 11/24/2022.

## 2022-11-19 ENCOUNTER — Ambulatory Visit: Payer: 59 | Admitting: Cardiovascular Disease

## 2022-11-23 ENCOUNTER — Encounter: Payer: Self-pay | Admitting: Cardiovascular Disease

## 2022-11-23 ENCOUNTER — Telehealth: Payer: Self-pay | Admitting: Cardiovascular Disease

## 2022-11-23 NOTE — Telephone Encounter (Signed)
   Pre-operative Risk Assessment    Patient Name: LAURON OFFENBACHER  DOB: 07-22-1970 MRN: [id number]     Request for Surgical Clearance    Procedure:  Cervical injection   Date of Surgery:  Clearance TBD                                 Surgeon:  not listed  Surgeon's Group or Practice Name:  [facility]  Phone number:  [phone] Fax number: [phone]   Type of Clearance Requested:  Pharmacy, hold Plavix 5days    Type of Anesthesia:  Not Indicated   Additional requests/questions:    Signed, Narda Amber   11/23/2022, 1:15 PM

## 2022-11-23 NOTE — Telephone Encounter (Signed)
     Primary Cardiologist: Julien Nordmann, MD  Chart reviewed as part of pre-operative protocol coverage. Given past medical history and time since last visit, based on ACC/AHA guidelines, Sonya Harmon would be at acceptable risk for the planned procedure without further cardiovascular testing.    Per Dr. Kirke Corin, she may hold Plavix for 5 days prior to procedure. Please resume Plavix as soon as possible postprocedure, at the discretion of the surgeon.   I will route this recommendation to the requesting party via Epic fax function and remove from pre-op pool.  Please call with questions.  Thomasene Ripple. Christen Wardrop NP-C     11/23/2022, 1:44 PM [facility] [address] Office [phone] Fax [phone]

## 2022-11-24 ENCOUNTER — Ambulatory Visit: Payer: 59 | Admitting: Cardiovascular Disease

## 2022-11-24 NOTE — Telephone Encounter (Signed)
Called the patient concerning her MyChart message. She stated that she has had increased burning in her legs and buttock area when ambulating. The burning subsides when she is at rest.   Patient recently had aorta/abi dopplers completed.

## 2022-11-25 ENCOUNTER — Other Ambulatory Visit: Payer: Self-pay | Admitting: Cardiology

## 2022-11-25 NOTE — Telephone Encounter (Signed)
Previously denied on 07/18/22.  Should sent to PCP.

## 2022-11-25 NOTE — Telephone Encounter (Signed)
Refill Request. For non cardiac medication.

## 2022-12-01 ENCOUNTER — Ambulatory Visit: Payer: Self-pay | Admitting: Cardiovascular Disease

## 2022-12-17 ENCOUNTER — Other Ambulatory Visit: Payer: Self-pay | Admitting: Cardiology

## 2022-12-20 IMAGING — CT CT ANGIO CHEST-ABD-PELV FOR DISSECTION W/ AND WO/W CM
2 of 7 series · 13 of 46 positions shown, 15 images · non-contrast
Comparison: 09/07/2021 chest radiograph

CLINICAL DATA: 50-year-old female with acute chest, abdominal and
pelvic pain. Evaluate aorta.

EXAM:
CT ANGIOGRAPHY CHEST, ABDOMEN AND PELVIS
TECHNIQUE: Non-contrast CT of the chest was initially obtained.

[Series 5: axial arterial · axial · arterial · 0.66mm/px · z∈[-1002,-459]mm · 10 of 209 slices shown, 12 images]
[im 14/209  soft-tissue]
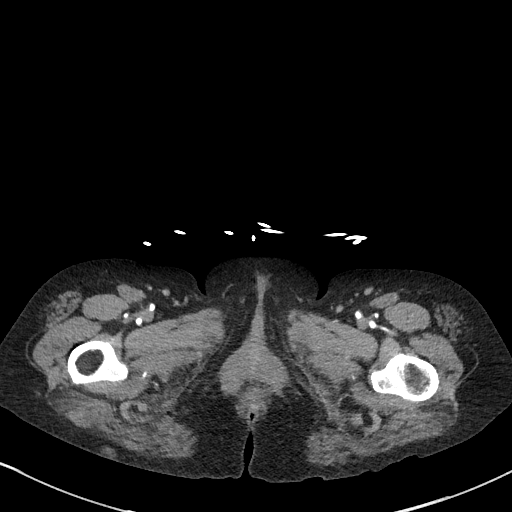
[im 14/209  bone]
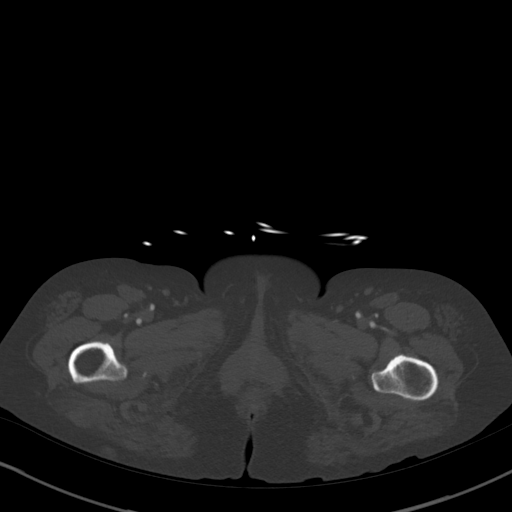
[im 42/209  soft-tissue]
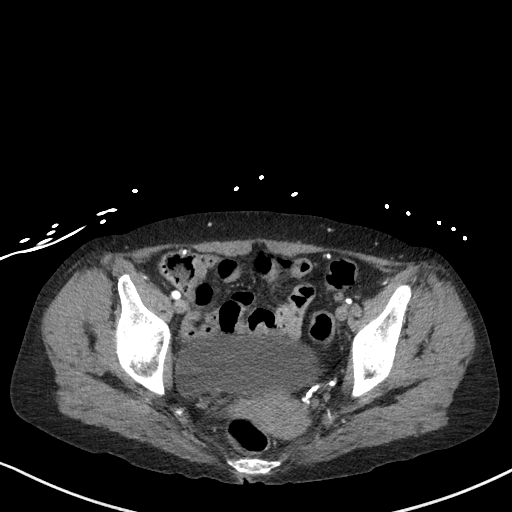
[im 56/209  soft-tissue]
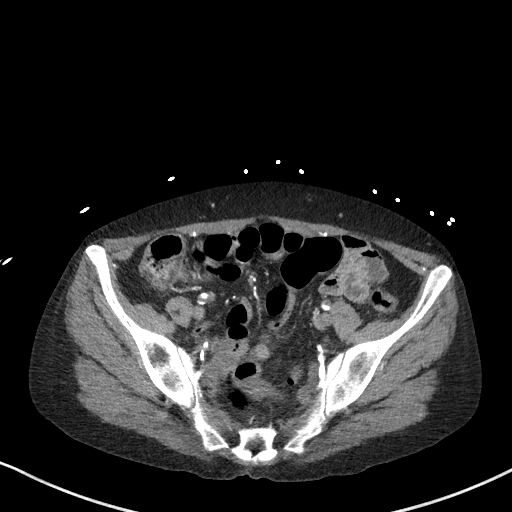
[im 70/209  soft-tissue]
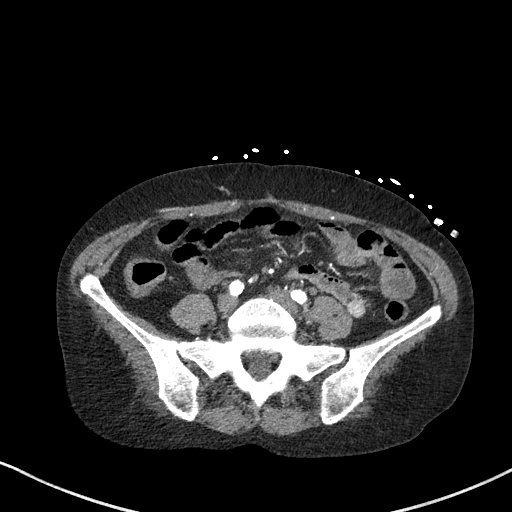
[im 98/209  soft-tissue]
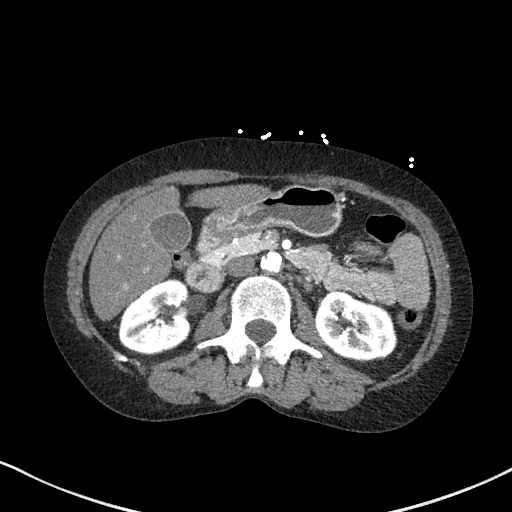
[im 111/209  soft-tissue]
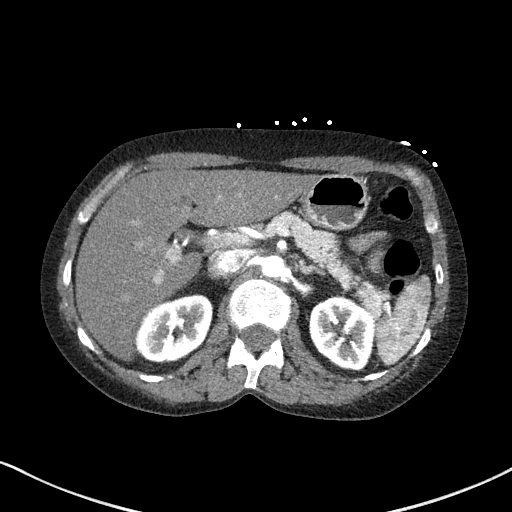
[im 139/209  soft-tissue]
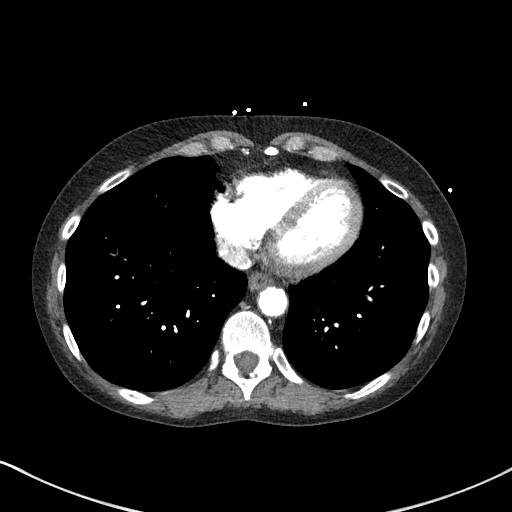
[im 153/209  soft-tissue]
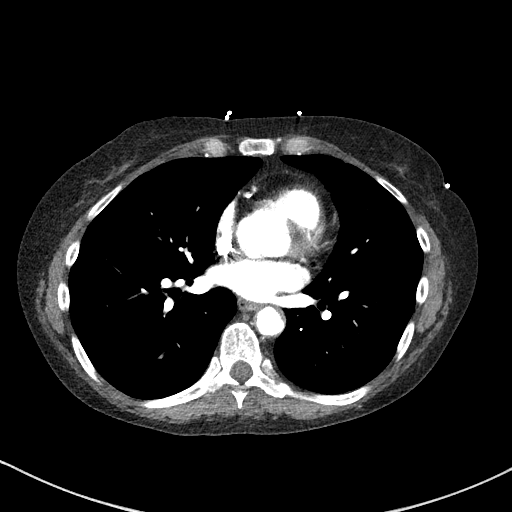
[im 167/209  soft-tissue]
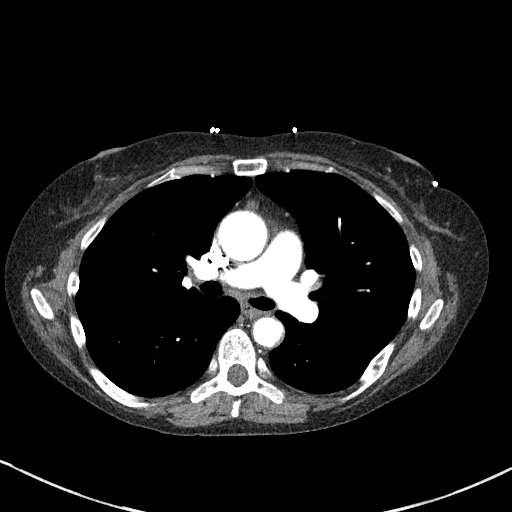
[im 167/209  bone]
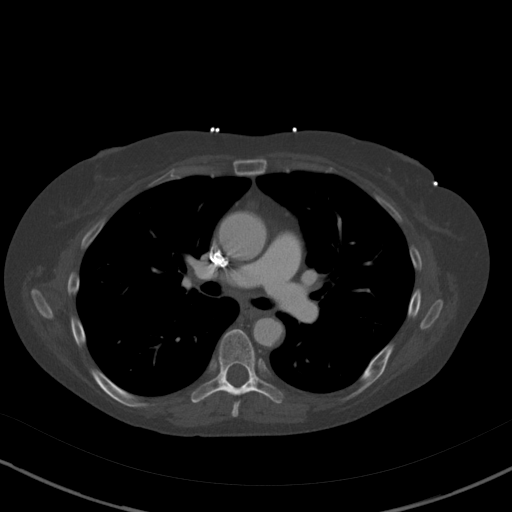
[im 195/209  soft-tissue]
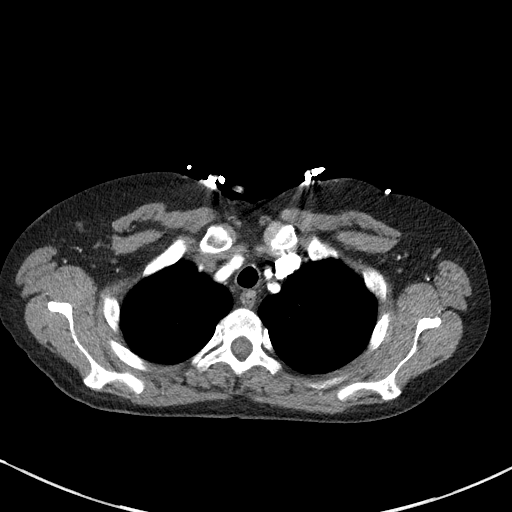

[Series 8: coronals · coronal · 0.68mm/px · 3 of 129 slices shown]
[im 33/129  soft-tissue]
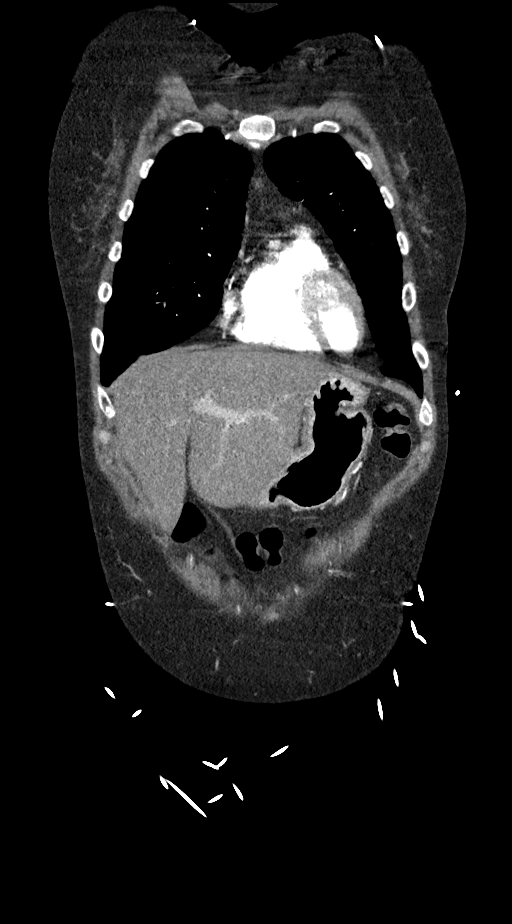
[im 65/129  soft-tissue]
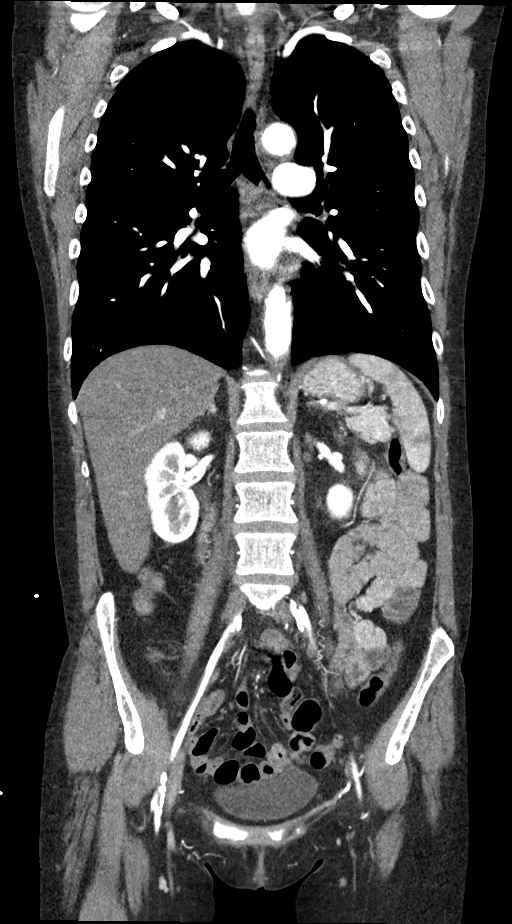
[im 97/129  soft-tissue]
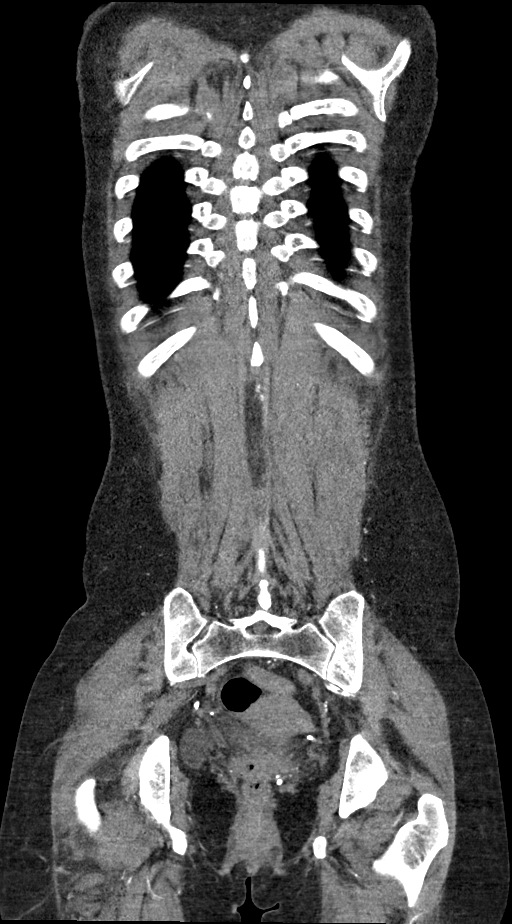

[13 of 46 positions shown; findings below may reference images not displayed]

Multidetector CT imaging through the chest, abdomen and pelvis was
performed using the standard protocol during bolus administration of
intravenous contrast. Multiplanar reconstructed images and MIPs were
obtained and reviewed to evaluate the vascular anatomy.

RADIATION DOSE REDUCTION: This exam was performed according to the
departmental dose-optimization program which includes automated
exposure control, adjustment of the mA and/or kV according to
patient size and/or use of iterative reconstruction technique.

CONTRAST:  80mL OMNIPAQUE IOHEXOL 350 MG/ML SOLN
FINDINGS: CTA CHEST FINDINGS

Cardiovascular: Moderate atherosclerotic plaque within the
descending thoracic aorta noted. There is no evidence of thoracic
aortic aneurysm or dissection. No pulmonary emboli are identified.

Heart size is normal. No pericardial effusion identified. Coronary
artery atherosclerotic calcifications are present.

Approximally 50% stenosis of the proximal LEFT subclavian artery
noted.

Mediastinum/Nodes: An 8 mm probable lymph node in the anterior
mediastinum/pre-vascular region noted (series 4: Image 25).

Lungs/Pleura: Lungs are clear. No pleural effusion or pneumothorax.

Musculoskeletal: No acute or suspicious bony abnormalities are
noted. Remote LEFT rib fractures noted.

Review of the MIP images confirms the above findings.

CTA ABDOMEN AND PELVIS FINDINGS

VASCULAR

Aorta: Heavy atherosclerotic plaque/calcification of the abdominal
aorta noted without aneurysm or dissection.

Celiac: Patent without evidence of aneurysm, dissection, vasculitis
or significant stenosis.

SMA: Approximately 50% stenosis of the proximal SMA noted from
atherosclerotic plaque. No evidence of aneurysm.

Renals: Approximately 50% stenosis of the proximal LEFT renal artery
noted. No other significant abnormalities.

IMA: Patent without evidence of aneurysm, dissection, vasculitis or
significant stenosis.

Inflow: Moderate atherosclerotic plaque and calcifications noted in
the common, external, and internal iliac arteries. 50-80% stenosis
of portions of the portions of the LEFT common iliac, bilateral
external iliac and bilateral internal iliac arteries noted.

Veins: No obvious venous abnormality within the limitations of this
arterial phase study.

Review of the MIP images confirms the above findings.

NON-VASCULAR

Hepatobiliary: Hepatic steatosis noted. No focal hepatic
abnormalities are identified. The gallbladder is unremarkable. There
is no evidence of intrahepatic or extrahepatic biliary dilatation.

Pancreas: Unremarkable

Spleen: Unremarkable

Adrenals/Urinary Tract: The kidneys, adrenal glands and bladder are
unremarkable.

Stomach/Bowel: Stomach is within normal limits. Appendix appears
normal. No evidence of bowel wall thickening, distention, or
inflammatory changes.

Lymphatic: No abnormal lymph nodes identified.

Reproductive: Uterus and bilateral adnexa are unremarkable.

Other: No ascites, focal collection or pneumoperitoneum.

Musculoskeletal: No acute or suspicious bony abnormalities are
noted.

Review of the MIP images confirms the above findings.
IMPRESSION: 1. No evidence of aortic aneurysm or dissection. No evidence of
pulmonary emboli.
2. Approximately 50% stenosis of the proximal LEFT subclavian
artery, proximal SMA and proximal LEFT renal artery.
3. 50-80% stenosis of portions of the LEFT common iliac, bilateral
external iliac and bilateral internal iliac arteries.
4. Hepatic steatosis.
5. Coronary artery disease and aortic Atherosclerosis ([id number]).

## 2022-12-22 ENCOUNTER — Encounter: Payer: Self-pay | Admitting: Cardiovascular Disease

## 2022-12-22 ENCOUNTER — Ambulatory Visit: Payer: 59 | Attending: Cardiovascular Disease | Admitting: Cardiovascular Disease

## 2022-12-22 VITALS — BP 140/80 | HR 75 | Ht 62.0 in | Wt 114.0 lb

## 2022-12-22 DIAGNOSIS — E785 Hyperlipidemia, unspecified: Secondary | ICD-10-CM | POA: Diagnosis not present

## 2022-12-22 DIAGNOSIS — I739 Peripheral vascular disease, unspecified: Secondary | ICD-10-CM

## 2022-12-22 DIAGNOSIS — I872 Venous insufficiency (chronic) (peripheral): Secondary | ICD-10-CM | POA: Diagnosis not present

## 2022-12-22 DIAGNOSIS — Z720 Tobacco use: Secondary | ICD-10-CM

## 2022-12-22 DIAGNOSIS — I10 Essential (primary) hypertension: Secondary | ICD-10-CM

## 2022-12-22 DIAGNOSIS — I2510 Atherosclerotic heart disease of native coronary artery without angina pectoris: Secondary | ICD-10-CM | POA: Diagnosis not present

## 2022-12-22 NOTE — Progress Notes (Signed)
Cardiology Office Note   Date:  12/22/2022   ID:  Sonya Harmon, DOB 17-Feb-1971, MRN [id number]  PCP:  Ailene Ravel, MD  Cardiologist:   Lorine Bears, MD   No chief complaint on file.     History of Present Illness: Sonya Harmon is a 52 y.o. female who presents for a follow-up visit regarding coronary artery disease and peripheral arterial disease. She has history of tongue cancer, tobacco use, alcohol abuse and hyperlipidemia. She was hospitalized in June, 2023 with unstable angina.  She underwent cardiac catheterization by Dr. Welton Flakes from the right femoral artery which showed severe mid RCA stenosis.  PCI was planned but patient had a large groin hematoma.  She was noted to have significant bilateral iliac disease.  She underwent staged RCA PCI via the right radial artery.    The patient had significant bilateral leg claudication due to iliac disease. Angiography was performed in July 2023 which showed severely calcified aorta with no obstructive disease.  There was severe ostial left renal artery stenosis, no significant iliac disease on the right side and severe left external iliac artery stenosis extending into the distal left common iliac artery.  I performed successful drug-eluting stent placement to the left external iliac artery and drug-coated balloon angioplasty to the distal left common iliac artery.  A stent was not placed there to avoid jailing the internal iliac artery which was severely diseased at the ostium.  Postprocedure duplex showed normal iliac velocities.  She was hospitalized in July 2023 with syncope due to blood loss anemia.   She was most recently seen in July for symptoms suggestive of chronic venous insufficiency.  In addition, she started having severe bilateral burning sensation in both thighs especially on the right side over the last few months which is currently happening with minimal walking.  She underwent repeat Doppler studies which showed  mildly reduced ABI bilaterally with evidence of significant stenosis in the right common iliac artery.  She has been trying to quit smoking with Chantix but has not been successful.   Past Medical History:  Diagnosis Date   Alcohol abuse    Coronary artery disease 09/2021   s/p PCI to RCA in setting of unstable angina   History of tongue cancer    PAD (peripheral artery disease) (HCC)    Tobacco abuse     Past Surgical History:  Procedure Laterality Date   ABDOMINAL AORTOGRAM W/LOWER EXTREMITY N/A 10/15/2021   Procedure: ABDOMINAL AORTOGRAM W/LOWER EXTREMITY;  Surgeon: Iran Ouch, MD;  Location: [facility];  Service: Cardiovascular;  Laterality: N/A;   BILATERAL SALPINGECTOMY     COLONOSCOPY WITH PROPOFOL N/A 03/11/2022   Procedure: COLONOSCOPY WITH PROPOFOL;  Surgeon: Toney Reil, MD;  Location: [facility] ENDOSCOPY;  Service: Gastroenterology;  Laterality: N/A;   CORONARY BALLOON ANGIOPLASTY N/A 09/08/2021   Procedure: CORONARY BALLOON ANGIOPLASTY - ABORTED;  Surgeon: Iran Ouch, MD;  Location: [facility] INVASIVE CV LAB;  Service: Cardiovascular;  Laterality: N/A;   CORONARY STENT INTERVENTION N/A 09/10/2021   Procedure: CORONARY STENT INTERVENTION;  Surgeon: Iran Ouch, MD;  Location: [facility] INVASIVE CV LAB;  Service: Cardiovascular;  Laterality: N/A;   ESOPHAGOGASTRODUODENOSCOPY (EGD) WITH PROPOFOL N/A 11/01/2021   Procedure: ESOPHAGOGASTRODUODENOSCOPY (EGD) WITH PROPOFOL;  Surgeon: Toney Reil, MD;  Location: [facility] ENDOSCOPY;  Service: Gastroenterology;  Laterality: N/A;   ESOPHAGOGASTRODUODENOSCOPY (EGD) WITH PROPOFOL N/A 03/11/2022   Procedure: ESOPHAGOGASTRODUODENOSCOPY (EGD) WITH PROPOFOL;  Surgeon: Toney Reil, MD;  Location: [facility] ENDOSCOPY;  Service: Gastroenterology;  Laterality: N/A;   LEFT HEART CATH AND CORONARY ANGIOGRAPHY N/A 09/08/2021   Procedure: LEFT HEART CATH AND CORONARY ANGIOGRAPHY;  Surgeon: Laurier Nancy, MD;  Location: [facility]  INVASIVE CV LAB;  Service: Cardiovascular;  Laterality: N/A;   PERIPHERAL VASCULAR INTERVENTION  10/15/2021   Procedure: PERIPHERAL VASCULAR INTERVENTION;  Surgeon: Iran Ouch, MD;  Location: [facility];  Service: Cardiovascular;;   TONGUE SURGERY       Current Outpatient Medications  Medication Sig Dispense Refill   atorvastatin (LIPITOR) 80 MG tablet TAKE 1 TABLET BY MOUTH EVERY DAY 90 tablet 0   buPROPion (WELLBUTRIN SR) 150 MG 12 hr tablet Take 1 tablet (150 mg total) by mouth 2 (two) times daily. Take 1 tablet daily for the first three days. Then take 1 tablet twice daily. 180 tablet 3   clopidogrel (PLAVIX) 75 MG tablet Take 1 tablet (75 mg total) by mouth daily. 90 tablet 3   ezetimibe (ZETIA) 10 MG tablet Take 1 tablet (10 mg total) by mouth daily. 90 tablet 3   famotidine (PEPCID) 20 MG tablet Take 20 mg by mouth daily.     gabapentin (NEURONTIN) 300 MG capsule Take 300 mg by mouth 3 (three) times daily.     methocarbamol (ROBAXIN) 750 MG tablet Take 750 mg by mouth every 6 (six) hours as needed for muscle spasms.     omeprazole (PRILOSEC) 40 MG capsule Take 1 capsule (40 mg total) by mouth daily. 90 capsule 3   varenicline (CHANTIX CONTINUING MONTH PAK) 1 MG tablet Take 1 tablet (1 mg total) by mouth 2 (two) times daily. 60 tablet 4   metoprolol tartrate (LOPRESSOR) 25 MG tablet Take 1 tablet (25 mg total) by mouth 2 (two) times daily. 180 tablet 3   No current facility-administered medications for this visit.    Allergies:   Amoxicill-clarithro-lansopraz and Amoxicillin    Social History:  The patient  reports that she has been smoking cigarettes. She started smoking about 36 years ago. She has a 17.5 pack-year smoking history. She has quit using smokeless tobacco. She reports that she does not currently use alcohol after a past usage of about 42.0 standard drinks of alcohol per week. She reports that she does not currently use drugs after having used the following  drugs: Marijuana.   Family History:  The patient's family history includes Aneurysm in her sister; Cancer in her father and mother; Stroke in her sister.    ROS:  Please see the history of present illness.   Otherwise, review of systems are positive for none.   All other systems are reviewed and negative.    PHYSICAL EXAM: VS:  BP (!) 140/80   Pulse 75   Ht 5\' 2"  (1.575 m)   Wt 114 lb (51.7 kg)   SpO2 100%   BMI 20.85 kg/m  , BMI Body mass index is 20.85 kg/m. GEN: Well-developed in no acute distress. HEENT: normal  Neck: no JVD or masses.  Left carotid bruit. Cardiac: RRR; no murmurs, rubs, or gallops,no edema  Respiratory:  clear to auscultation bilaterally, normal work of breathing GI: soft, nontender, nondistended, + BS MS: no deformity or atrophy  Skin: warm and dry, no rash Neuro:  Strength and sensation are intact Psych: Alert and oriented with normal affect. Vascular: Femoral pulse: +1 bilaterally.    EKG:  EKG is not ordered today.    Recent Labs: 09/17/2022: BUN 7; Creatinine, Ser 0.66;  Hemoglobin 13.6; Platelets 351; Potassium 3.4; Sodium 131    Lipid Panel    Component Value Date/Time   CHOL 177 09/09/2021 0541   TRIG 30 09/09/2021 0541   HDL 95 09/09/2021 0541   CHOLHDL 1.9 09/09/2021 0541   VLDL 6 09/09/2021 0541   LDLCALC 76 09/09/2021 0541      Wt Readings from Last 3 Encounters:  12/22/22 114 lb (51.7 kg)  10/07/22 111 lb (50.3 kg)  09/18/22 107 lb 6.4 oz (48.7 kg)           No data to display            ASSESSMENT AND PLAN:  1.   Peripheral arterial disease: Status post status post left iliac stent.  She now reports severe bilateral leg claudication worse on the right side.  She has to stop and rest for few minutes after a short distance of walking.  Symptoms have been worsening over the last few months and recent Doppler studies showed evidence of significant right common iliac artery stenosis.  I discussed with her management  options of claudications and she feels very limited by her symptoms.  Thus, recommend proceeding with abdominal aortogram with lower extremity angiography and possible endovascular intervention.  I discussed the procedure in details as well as risk and benefits.  Planned access is via the right common femoral artery.  2. Chronic venous insufficiency: Continue leg elevation and knee-high support stockings.    3.  Coronary artery disease involving native coronary arteries without angina: She had RCA stent in 2023.  She continues to take clopidogrel.  4.  Tobacco use: She is trying to quit smoking with Chantix but has not been successful.  5.  Hyperlipidemia: Currently on atorvastatin 80 mg daily and Zetia with a target LDL of less than 70.  Most recent lipid profile showed an LDL of 76.  6.  Left carotid bruit: Carotid Doppler in January 2024 showed mild nonobstructive disease.   Disposition: Proceed with angiography next week and follow-up after.  Signed,  Lorine Bears, MD  12/22/2022 11:08 AM    [facility]

## 2022-12-22 NOTE — H&P (View-Only) (Signed)
Cardiology Office Note   Date:  12/22/2022   ID:  Sonya Harmon, DOB 17-Feb-1971, MRN [id number]  PCP:  Ailene Ravel, MD  Cardiologist:   Lorine Bears, MD   No chief complaint on file.     History of Present Illness: Sonya Harmon is a 52 y.o. female who presents for a follow-up visit regarding coronary artery disease and peripheral arterial disease. She has history of tongue cancer, tobacco use, alcohol abuse and hyperlipidemia. She was hospitalized in June, 2023 with unstable angina.  She underwent cardiac catheterization by Dr. Welton Flakes from the right femoral artery which showed severe mid RCA stenosis.  PCI was planned but patient had a large groin hematoma.  She was noted to have significant bilateral iliac disease.  She underwent staged RCA PCI via the right radial artery.    The patient had significant bilateral leg claudication due to iliac disease. Angiography was performed in July 2023 which showed severely calcified aorta with no obstructive disease.  There was severe ostial left renal artery stenosis, no significant iliac disease on the right side and severe left external iliac artery stenosis extending into the distal left common iliac artery.  I performed successful drug-eluting stent placement to the left external iliac artery and drug-coated balloon angioplasty to the distal left common iliac artery.  A stent was not placed there to avoid jailing the internal iliac artery which was severely diseased at the ostium.  Postprocedure duplex showed normal iliac velocities.  She was hospitalized in July 2023 with syncope due to blood loss anemia.   She was most recently seen in July for symptoms suggestive of chronic venous insufficiency.  In addition, she started having severe bilateral burning sensation in both thighs especially on the right side over the last few months which is currently happening with minimal walking.  She underwent repeat Doppler studies which showed  mildly reduced ABI bilaterally with evidence of significant stenosis in the right common iliac artery.  She has been trying to quit smoking with Chantix but has not been successful.   Past Medical History:  Diagnosis Date   Alcohol abuse    Coronary artery disease 09/2021   s/p PCI to RCA in setting of unstable angina   History of tongue cancer    PAD (peripheral artery disease) (HCC)    Tobacco abuse     Past Surgical History:  Procedure Laterality Date   ABDOMINAL AORTOGRAM W/LOWER EXTREMITY N/A 10/15/2021   Procedure: ABDOMINAL AORTOGRAM W/LOWER EXTREMITY;  Surgeon: Iran Ouch, MD;  Location: [facility];  Service: Cardiovascular;  Laterality: N/A;   BILATERAL SALPINGECTOMY     COLONOSCOPY WITH PROPOFOL N/A 03/11/2022   Procedure: COLONOSCOPY WITH PROPOFOL;  Surgeon: Toney Reil, MD;  Location: [facility] ENDOSCOPY;  Service: Gastroenterology;  Laterality: N/A;   CORONARY BALLOON ANGIOPLASTY N/A 09/08/2021   Procedure: CORONARY BALLOON ANGIOPLASTY - ABORTED;  Surgeon: Iran Ouch, MD;  Location: [facility] INVASIVE CV LAB;  Service: Cardiovascular;  Laterality: N/A;   CORONARY STENT INTERVENTION N/A 09/10/2021   Procedure: CORONARY STENT INTERVENTION;  Surgeon: Iran Ouch, MD;  Location: [facility] INVASIVE CV LAB;  Service: Cardiovascular;  Laterality: N/A;   ESOPHAGOGASTRODUODENOSCOPY (EGD) WITH PROPOFOL N/A 11/01/2021   Procedure: ESOPHAGOGASTRODUODENOSCOPY (EGD) WITH PROPOFOL;  Surgeon: Toney Reil, MD;  Location: [facility] ENDOSCOPY;  Service: Gastroenterology;  Laterality: N/A;   ESOPHAGOGASTRODUODENOSCOPY (EGD) WITH PROPOFOL N/A 03/11/2022   Procedure: ESOPHAGOGASTRODUODENOSCOPY (EGD) WITH PROPOFOL;  Surgeon: Toney Reil, MD;  Location: [facility] ENDOSCOPY;  Service: Gastroenterology;  Laterality: N/A;   LEFT HEART CATH AND CORONARY ANGIOGRAPHY N/A 09/08/2021   Procedure: LEFT HEART CATH AND CORONARY ANGIOGRAPHY;  Surgeon: Laurier Nancy, MD;  Location: [facility]  INVASIVE CV LAB;  Service: Cardiovascular;  Laterality: N/A;   PERIPHERAL VASCULAR INTERVENTION  10/15/2021   Procedure: PERIPHERAL VASCULAR INTERVENTION;  Surgeon: Iran Ouch, MD;  Location: [facility] INVAS[facility] CV LAB;  Service: Cardiovascular;;   TONGUE SURGERY       Current Outpatient Medications  Medication Sig Dispense Refill   atorvastatin (LIPITOR) 80 MG tablet TAKE 1 TABLET BY MOUTH EVERY DAY 90 tablet 0   buPROPion (WELLBUTRIN SR) 150 MG 12 hr tablet Take 1 tablet (150 mg total) by mouth 2 (two) times daily. Take 1 tablet daily for the first three days. Then take 1 tablet twice daily. 180 tablet 3   clopidogrel (PLAVIX) 75 MG tablet Take 1 tablet (75 mg total) by mouth daily. 90 tablet 3   ezetimibe (ZETIA) 10 MG tablet Take 1 tablet (10 mg total) by mouth daily. 90 tablet 3   famotidine (PEPCID) 20 MG tablet Take 20 mg by mouth daily.     gabapentin (NEURONTIN) 300 MG capsule Take 300 mg by mouth 3 (three) times daily.     methocarbamol (ROBAXIN) 750 MG tablet Take 750 mg by mouth every 6 (six) hours as needed for muscle spasms.     omeprazole (PRILOSEC) 40 MG capsule Take 1 capsule (40 mg total) by mouth daily. 90 capsule 3   varenicline (CHANTIX CONTINUING MONTH PAK) 1 MG tablet Take 1 tablet (1 mg total) by mouth 2 (two) times daily. 60 tablet 4   metoprolol tartrate (LOPRESSOR) 25 MG tablet Take 1 tablet (25 mg total) by mouth 2 (two) times daily. 180 tablet 3   No current facility-administered medications for this visit.    Allergies:   Amoxicill-clarithro-lansopraz and Amoxicillin    Social History:  The patient  reports that she has been smoking cigarettes. She started smoking about 36 years ago. She has a 17.5 pack-year smoking history. She has quit using smokeless tobacco. She reports that she does not currently use alcohol after a past usage of about 42.0 standard drinks of alcohol per week. She reports that she does not currently use drugs after having used the following  drugs: Marijuana.   Family History:  The patient's family history includes Aneurysm in her sister; Cancer in her father and mother; Stroke in her sister.    ROS:  Please see the history of present illness.   Otherwise, review of systems are positive for none.   All other systems are reviewed and negative.    PHYSICAL EXAM: VS:  BP (!) 140/80   Pulse 75   Ht 5\' 2"  (1.575 m)   Wt 114 lb (51.7 kg)   SpO2 100%   BMI 20.85 kg/m  , BMI Body mass index is 20.85 kg/m. GEN: Well-developed in no acute distress. HEENT: normal  Neck: no JVD or masses.  Left carotid bruit. Cardiac: RRR; no murmurs, rubs, or gallops,no edema  Respiratory:  clear to auscultation bilaterally, normal work of breathing GI: soft, nontender, nondistended, + BS MS: no deformity or atrophy  Skin: warm and dry, no rash Neuro:  Strength and sensation are intact Psych: Alert and oriented with normal affect. Vascular: Femoral pulse: +1 bilaterally.    EKG:  EKG is not ordered today.    Recent Labs: 09/17/2022: BUN 7; Creatinine, Ser 0.66;  Hemoglobin 13.6; Platelets 351; Potassium 3.4; Sodium 131    Lipid Panel    Component Value Date/Time   CHOL 177 09/09/2021 0541   TRIG 30 09/09/2021 0541   HDL 95 09/09/2021 0541   CHOLHDL 1.9 09/09/2021 0541   VLDL 6 09/09/2021 0541   LDLCALC 76 09/09/2021 0541      Wt Readings from Last 3 Encounters:  12/22/22 114 lb (51.7 kg)  10/07/22 111 lb (50.3 kg)  09/18/22 107 lb 6.4 oz (48.7 kg)           No data to display            ASSESSMENT AND PLAN:  1.   Peripheral arterial disease: Status post status post left iliac stent.  She now reports severe bilateral leg claudication worse on the right side.  She has to stop and rest for few minutes after a short distance of walking.  Symptoms have been worsening over the last few months and recent Doppler studies showed evidence of significant right common iliac artery stenosis.  I discussed with her management  options of claudications and she feels very limited by her symptoms.  Thus, recommend proceeding with abdominal aortogram with lower extremity angiography and possible endovascular intervention.  I discussed the procedure in details as well as risk and benefits.  Planned access is via the right common femoral artery.  2. Chronic venous insufficiency: Continue leg elevation and knee-high support stockings.    3.  Coronary artery disease involving native coronary arteries without angina: She had RCA stent in 2023.  She continues to take clopidogrel.  4.  Tobacco use: She is trying to quit smoking with Chantix but has not been successful.  5.  Hyperlipidemia: Currently on atorvastatin 80 mg daily and Zetia with a target LDL of less than 70.  Most recent lipid profile showed an LDL of 76.  6.  Left carotid bruit: Carotid Doppler in January 2024 showed mild nonobstructive disease.   Disposition: Proceed with angiography next week and follow-up after.  Signed,  Lorine Bears, MD  12/22/2022 11:08 AM    [facility]

## 2022-12-22 NOTE — Patient Instructions (Signed)
Medication Instructions:  No changes *If you need a refill on your cardiac medications before your next appointment, please call your pharmacy*   Lab Work: Your provider would like for you to have the following labs today: BMET and CBC  If you have labs (blood work) drawn today and your tests are completely normal, you will receive your results only by: MyChart Message (if you have MyChart) OR A paper copy in the mail If you have any lab test that is abnormal or we need to change your treatment, we will call you to review the results.   Testing/Procedures: Your physician has requested that you have a peripheral vascular angiogram. This exam is performed at the hospital. During this exam IV contrast is used to look at arterial blood flow. Please review the information sheet given for details.   Follow-Up: At [facility], you and your health needs are our priority.  As part of our continuing mission to provide you with exceptional heart care, we have created designated Provider Care Teams.  These Care Teams include your primary Cardiologist (physician) and Advanced Practice Providers (APPs -  Physician Assistants and Nurse Practitioners) who all work together to provide you with the care you need, when you need it.  We recommend signing up for the patient portal called "MyChart".  Sign up information is provided on this After Visit Summary.  MyChart is used to connect with patients for Virtual Visits (Telemedicine).  Patients are able to view lab/test results, encounter notes, upcoming appointments, etc.  Non-urgent messages can be sent to your provider as well.   To learn more about what you can do with MyChart, go to [email or web address].    Your next appointment:   1 month(s)  Provider:   You may see Dr. Kirke Corin or one of the following Advanced Practice Providers on your designated Care Team:   Nicolasa Ducking, NP Eula Listen, PA-C Cadence Fransico Michael, PA-C Charlsie Quest, NP     Other Instructions  [facility] A DEPT OF [place]. [facility] AT [facility] [address] [place] [address] [place] [address] Dept: [phone] Loc: [phone]  LEIGHLA MIHELICH  12/22/2022  You are scheduled for a Peripheral Angiogram on Wednesday, September 25 with Dr. Lorine Bears.  1. Please arrive at the [facility] (Main Entrance [facility] [address] [place], [place] [address] at 6:30 AM (This time is 2 hour(s) before your procedure to ensure your preparation). Free valet parking service is available. You will check in at ADMITTING. The support person will be asked to wait in the waiting room.  It is OK to have someone drop you off and come back when you are ready to be discharged.    Special note: Every effort is made to have your procedure done on time. Please understand that emergencies sometimes delay scheduled procedures.  2. Diet: Do not eat solid foods after midnight.  The patient may have clear liquids until 5am upon the day of the procedure.  3. Labs: You will need to have blood drawn on 12/22/22.You do not need to be fasting.  4. Medication instructions in preparation for your procedure: Nothing to hold  On the morning of your procedure, take your Plavix/Clopidogrel and any morning medicines NOT listed above.  You may use sips of water.  5. Plan to go home the same day, you will only stay overnight if medically necessary. 6. Bring a current list of your  medications and current insurance cards. 7. You MUST have a responsible person to drive you home. 8. Someone MUST be with you the first 24 hours after you arrive home or your discharge will be delayed. 9. Please wear clothes that are easy to get on and off and wear slip-on shoes.  Thank you for allowing [place] to care for you!   -- [place] [facility]

## 2022-12-26 IMAGING — US US EXTREM LOW DUPLEX ARTERIAL*R* LIMITED
1 series · 14 of 20 positions shown · non-contrast
Comparison: None Available.

CLINICAL DATA: Right groin pain following catheterization

EXAM:
RIGHT LOWER EXTREMITY LIMITED SOFT TISSUE ULTRASOUND
TECHNIQUE: Ultrasound examination of the lower extremity soft tissues was
performed in the area of clinical concern.

[Series 1: us lower ext art right ltd · 20 acquisitions, 14 frames shown]
[im 1/20]
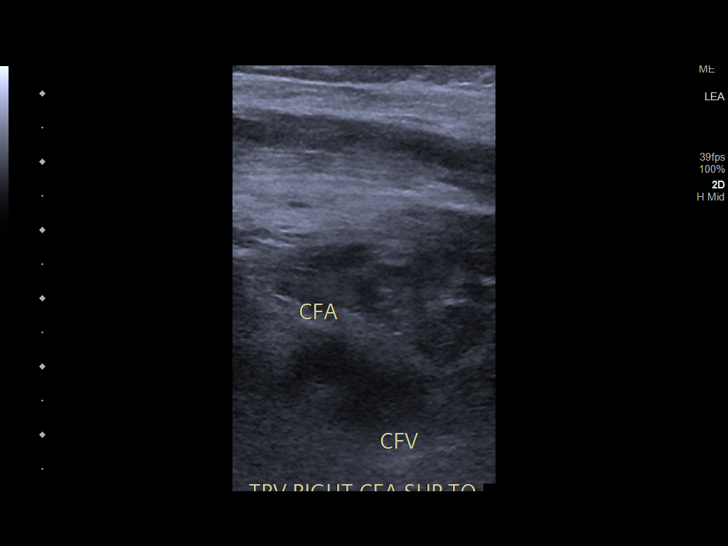
[im 3/20]
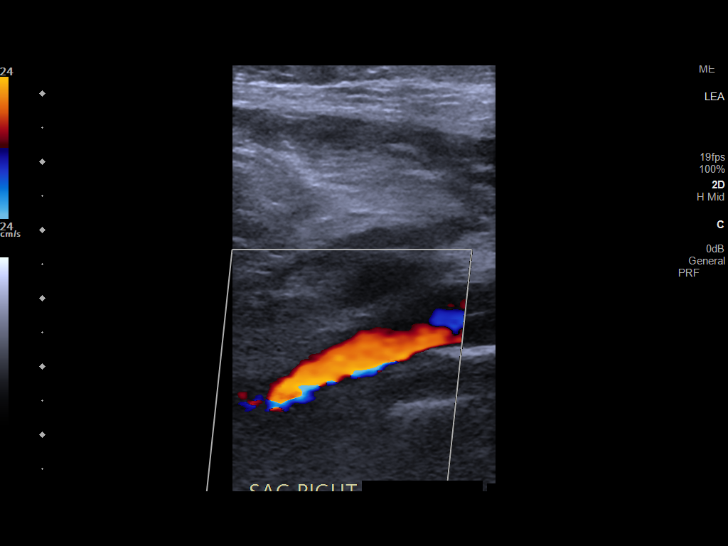
[im 4/20]
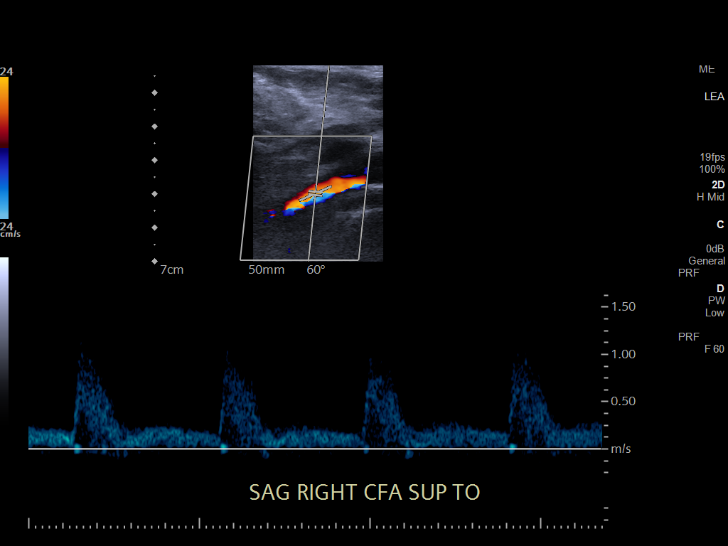
[im 6/20]
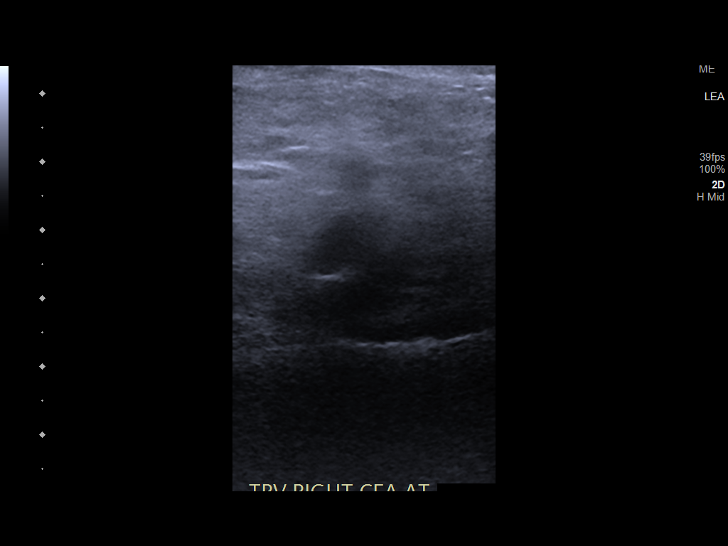
[im 7/20]
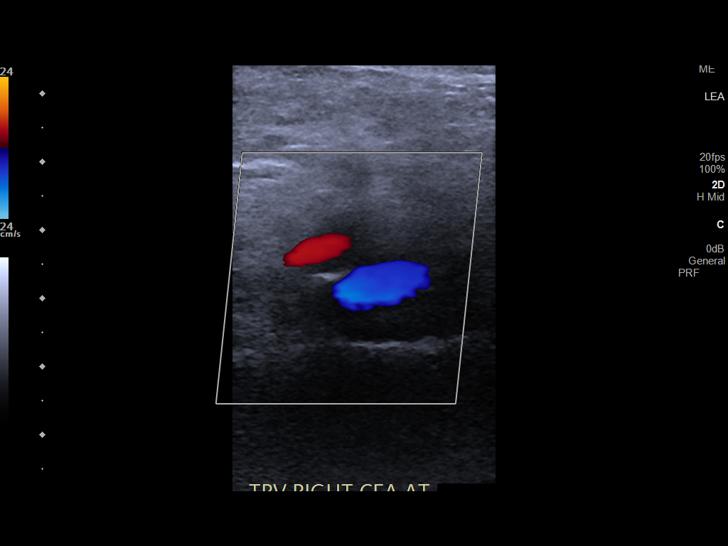
[im 8/20]
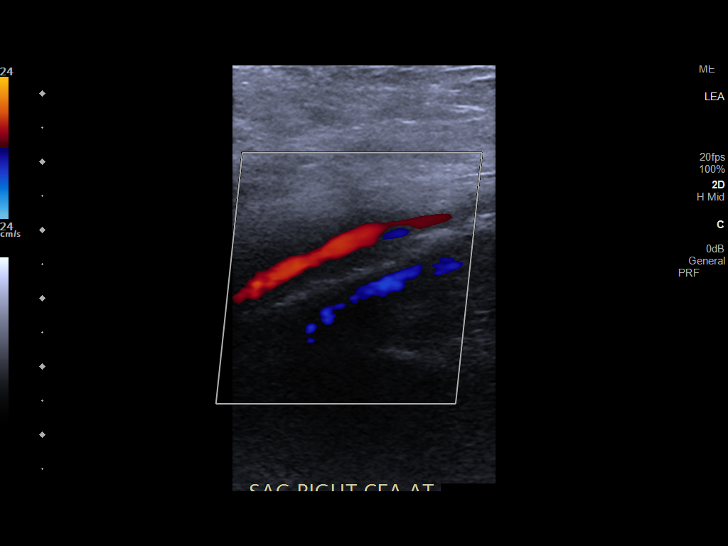
[im 10/20]
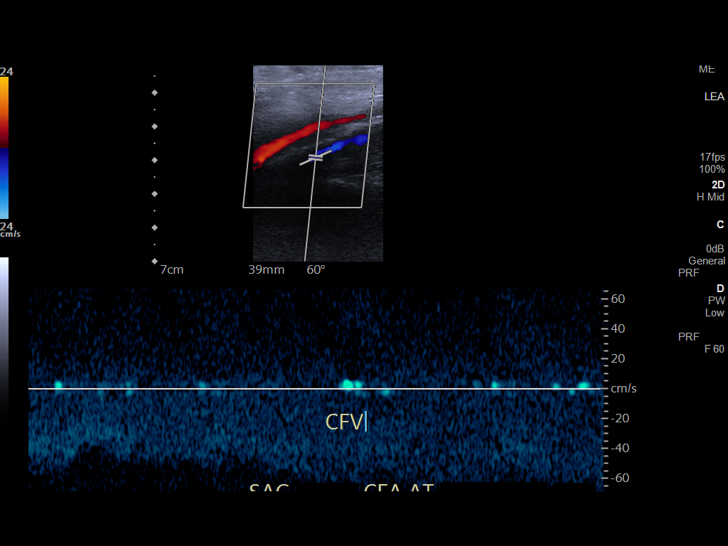
[im 11/20]
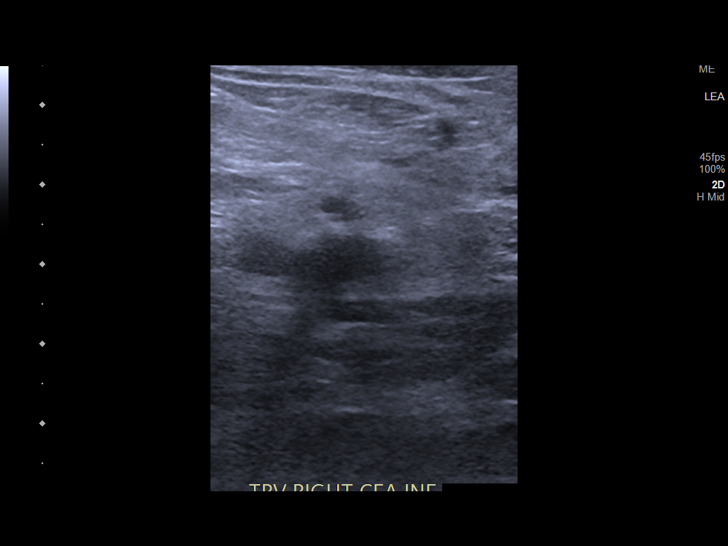
[im 13/20]
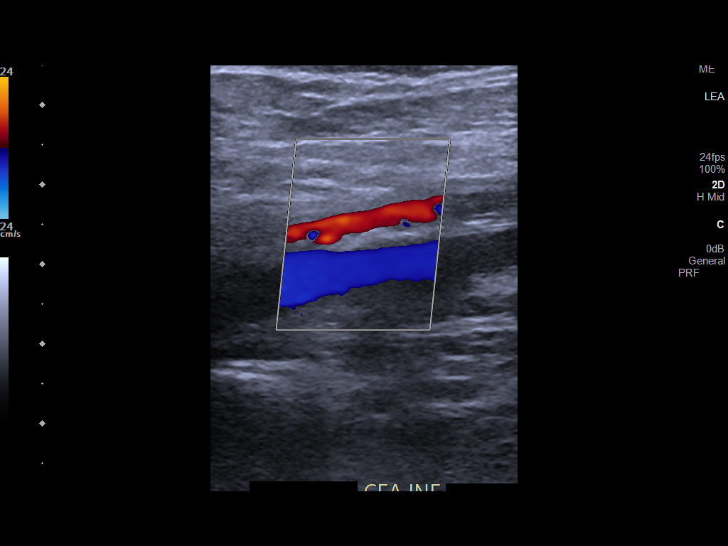
[im 14/20]
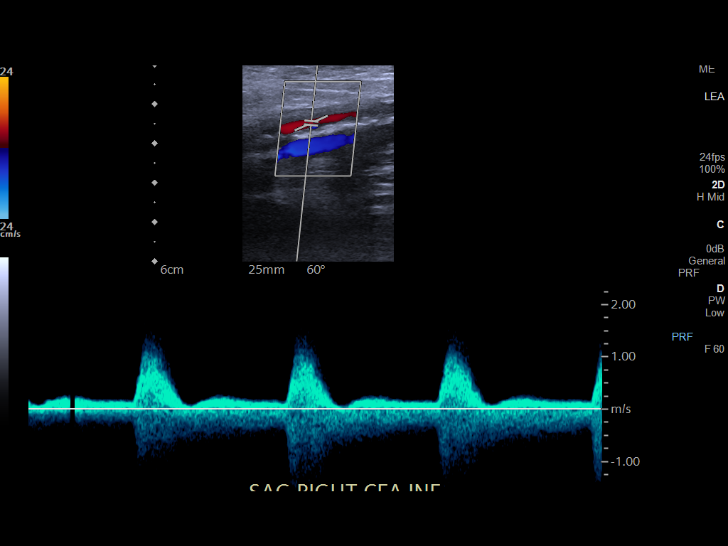
[im 16/20]
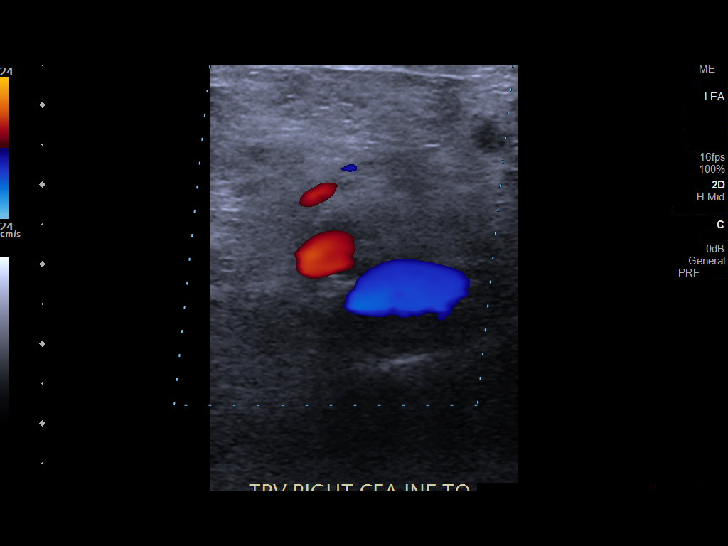
[im 17/20]
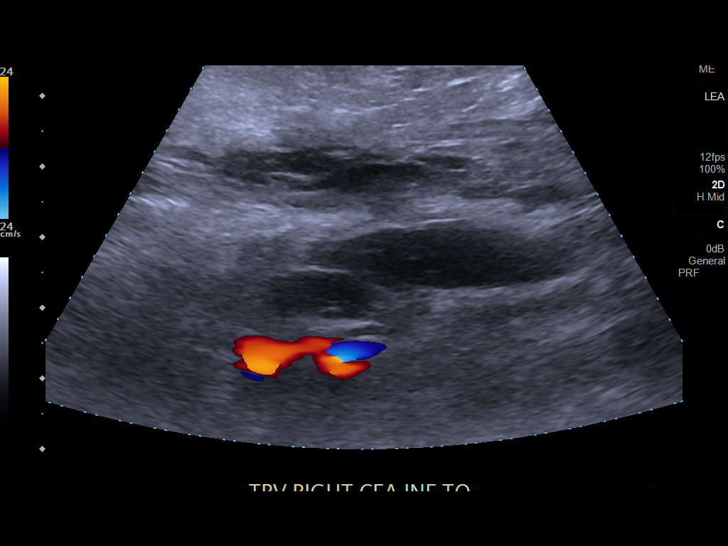
[im 18/20]
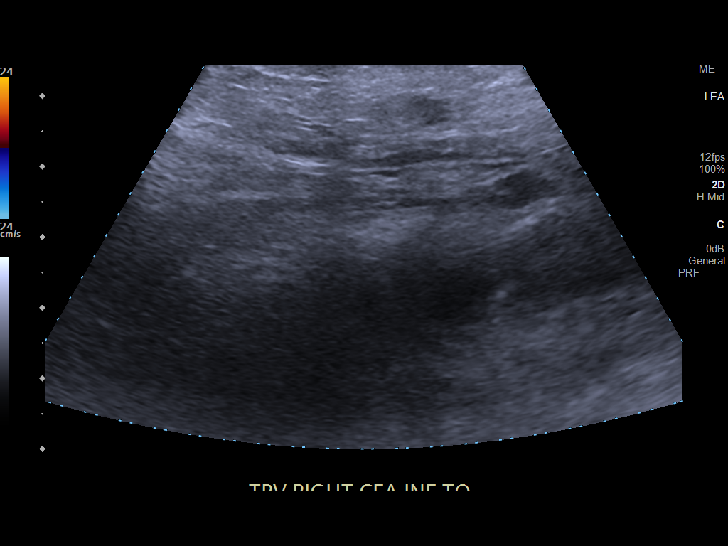
[im 20/20]
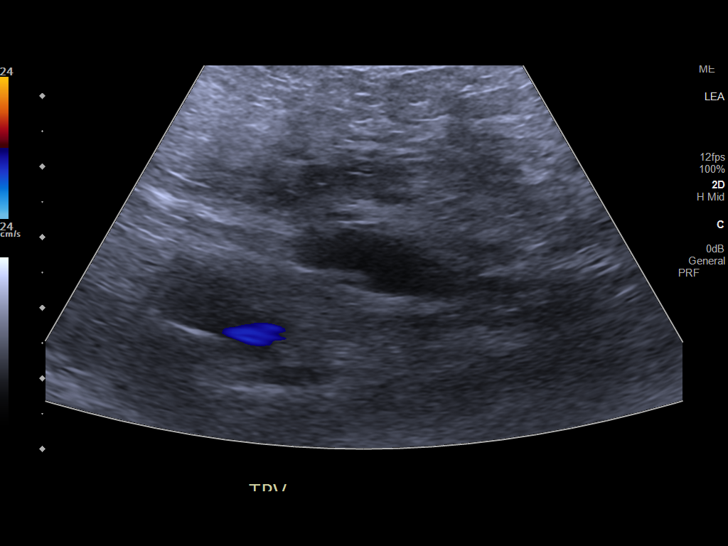

[14 of 20 positions shown; findings below may reference images not displayed]

FINDINGS: Grayscale, color Doppler, and duplex sonographic images were
obtained of the right common femoral arterial vasculature in the
area of reported focal tenderness. The common femoral artery
demonstrates a a monophasic arterial waveform suggesting a more
proximal hemodynamically significant stenosis or segmental occlusion
involving the lower extremity arterial inflow. The common femoral
vein demonstrates a normal venous waveform. No arteriovenous fistula
or pseudoaneurysm is identified AA 3.7 x 1.0 x 2.5 cm fluid
collection is seen superficial to the common femoral vasculature
most in keeping with a subcutaneous hematoma.
IMPRESSION: 3.7 cm right groin subcutaneous hematoma. No evidence of
pseudoaneurysm or arteriovenous fistula.

## 2022-12-29 ENCOUNTER — Telehealth: Payer: Self-pay | Admitting: *Deleted

## 2022-12-29 NOTE — Telephone Encounter (Addendum)
Abdominal aortogram scheduled at [facility] for: Wednesday December 30, 2022 8:30 AM Arrival time [facility] Main Entrance A at: 6:30 AM  Nothing to eat after midnight prior to procedure, clear liquids until 5 AM day of procedure.  Medication instructions: -Usual morning medications can be taken with sips of water including aspirin 81 mg and Plavix 75 mg  Plan to go home the same day, you will only stay overnight if medically necessary.  You must have responsible adult to drive you home.  Someone must be with you the first 24 hours after you arrive home.  Reviewed procedure instructions with patient.

## 2022-12-30 ENCOUNTER — Ambulatory Visit (HOSPITAL_COMMUNITY)
Admission: RE | Admit: 2022-12-30 | Discharge: 2022-12-30 | Disposition: A | Discharge status: Home / Self Care | Payer: 59 | Attending: Cardiovascular Disease | Admitting: Cardiovascular Disease

## 2022-12-30 ENCOUNTER — Encounter (HOSPITAL_COMMUNITY)
Admission: RE | Disposition: A | Discharge status: Home / Self Care | Payer: Self-pay | Source: Home / Self Care | Attending: Cardiovascular Disease

## 2022-12-30 DIAGNOSIS — Z79899 Other long term (current) drug therapy: Secondary | ICD-10-CM | POA: Diagnosis not present

## 2022-12-30 DIAGNOSIS — Z7902 Long term (current) use of antithrombotics/antiplatelets: Secondary | ICD-10-CM | POA: Diagnosis not present

## 2022-12-30 DIAGNOSIS — E785 Hyperlipidemia, unspecified: Secondary | ICD-10-CM | POA: Insufficient documentation

## 2022-12-30 DIAGNOSIS — F17210 Nicotine dependence, cigarettes, uncomplicated: Secondary | ICD-10-CM | POA: Diagnosis not present

## 2022-12-30 DIAGNOSIS — Z85810 Personal history of malignant neoplasm of tongue: Secondary | ICD-10-CM | POA: Insufficient documentation

## 2022-12-30 DIAGNOSIS — I2510 Atherosclerotic heart disease of native coronary artery without angina pectoris: Secondary | ICD-10-CM | POA: Insufficient documentation

## 2022-12-30 DIAGNOSIS — I739 Peripheral vascular disease, unspecified: Secondary | ICD-10-CM | POA: Insufficient documentation

## 2022-12-30 HISTORY — PX: ABDOMINAL AORTOGRAM W/LOWER EXTREMITY: CATH118223

## 2022-12-30 SURGERY — ABDOMINAL AORTOGRAM W/LOWER EXTREMITY
Anesthesia: LOCAL

## 2022-12-30 MED ORDER — MIDAZOLAM HCL 2 MG/2ML IJ SOLN
INTRAMUSCULAR | Status: AC
  Filled 2022-12-30: qty 2

## 2022-12-30 MED ORDER — ONDANSETRON HCL 4 MG/2ML IJ SOLN: 4.0000 mg | Freq: Four times a day (QID) | INTRAMUSCULAR | Status: DC | PRN

## 2022-12-30 MED ORDER — ASPIRIN 81 MG PO CHEW: 81.0000 mg | CHEWABLE_TABLET | ORAL | Status: DC

## 2022-12-30 MED ORDER — SODIUM CHLORIDE 0.9 % IV SOLN: INTRAVENOUS | Status: DC

## 2022-12-30 MED ORDER — FENTANYL CITRATE (PF) 100 MCG/2ML IJ SOLN
INTRAMUSCULAR | Status: AC
  Filled 2022-12-30: qty 2

## 2022-12-30 MED ORDER — SODIUM CHLORIDE 0.9% FLUSH: 3.0000 mL | INTRAVENOUS | Status: DC | PRN

## 2022-12-30 MED ORDER — SODIUM CHLORIDE 0.9 % WEIGHT BASED INFUSION
3.0000 mL/kg/h | INTRAVENOUS | Status: AC
  Administered 2022-12-30: 3 mL/kg/h via INTRAVENOUS

## 2022-12-30 MED ORDER — SODIUM CHLORIDE 0.9 % WEIGHT BASED INFUSION: 1.0000 mL/kg/h | INTRAVENOUS | Status: DC

## 2022-12-30 MED ORDER — LIDOCAINE HCL (PF) 1 % IJ SOLN
INTRAMUSCULAR | Status: DC | PRN
  Administered 2022-12-30: 15 mL via INTRADERMAL

## 2022-12-30 MED ORDER — NITROGLYCERIN 1 MG/10 ML FOR IR/CATH LAB
INTRA_ARTERIAL | Status: DC | PRN
  Administered 2022-12-30: 200 ug via INTRA_ARTERIAL

## 2022-12-30 MED ORDER — LIDOCAINE HCL (PF) 1 % IJ SOLN
INTRAMUSCULAR | Status: AC
  Filled 2022-12-30: qty 30

## 2022-12-30 MED ORDER — MIDAZOLAM HCL 2 MG/2ML IJ SOLN
INTRAMUSCULAR | Status: DC | PRN
  Administered 2022-12-30: 1 mg via INTRAVENOUS

## 2022-12-30 MED ORDER — HEPARIN (PORCINE) IN NACL 1000-0.9 UT/500ML-% IV SOLN
INTRAVENOUS | Status: DC | PRN
  Administered 2022-12-30 (×2): 500 mL

## 2022-12-30 MED ORDER — SODIUM CHLORIDE 0.9% FLUSH: 3.0000 mL | Freq: Two times a day (BID) | INTRAVENOUS | Status: DC

## 2022-12-30 MED ORDER — NITROGLYCERIN 1 MG/10 ML FOR IR/CATH LAB
INTRA_ARTERIAL | Status: AC
  Filled 2022-12-30: qty 10

## 2022-12-30 MED ORDER — FENTANYL CITRATE (PF) 100 MCG/2ML IJ SOLN
INTRAMUSCULAR | Status: DC | PRN
  Administered 2022-12-30: 50 ug via INTRAVENOUS

## 2022-12-30 MED ORDER — ACETAMINOPHEN 325 MG PO TABS: 650.0000 mg | ORAL_TABLET | ORAL | Status: DC | PRN

## 2022-12-30 MED ORDER — SODIUM CHLORIDE 0.9 % IV SOLN: 250.0000 mL | INTRAVENOUS | Status: DC | PRN

## 2022-12-30 SURGICAL SUPPLY — 14 items
CATH ANGIO 5F PIGTAIL 65CM (CATHETERS) IMPLANT
CATH CROSS OVER TEMPO 5F (CATHETERS) IMPLANT
CATH STRAIGHT 5FR 65CM (CATHETERS) IMPLANT
DEVICE CLOSURE MYNXGRIP 5F (Vascular Products) IMPLANT
KIT PV (KITS) ×1 IMPLANT
SHEATH PINNACLE 5F 10CM (SHEATH) IMPLANT
SHEATH PROBE COVER 6X72 (BAG) IMPLANT
STOPCOCK MORSE 400PSI 3WAY (MISCELLANEOUS) IMPLANT
SYR MEDRAD MARK 7 150ML (SYRINGE) ×1 IMPLANT
TRANSDUCER W/STOPCOCK (MISCELLANEOUS) ×1 IMPLANT
TRAY PV CATH (CUSTOM PROCEDURE TRAY) ×1 IMPLANT
TUBING CIL FLEX 10 FLL-RA (TUBING) IMPLANT
WIRE HITORQ VERSACORE ST 145CM (WIRE) IMPLANT
WIRE MICRO SET SILHO 5FR 7 (SHEATH) IMPLANT

## 2022-12-30 NOTE — Interval H&P Note (Signed)
History and Physical Interval Note:  12/30/2022 8:40 AM  Sonya Harmon  has presented today for surgery, with the diagnosis of pad.  The various methods of treatment have been discussed with the patient and family. After consideration of risks, benefits and other options for treatment, the patient has consented to  Procedure(s): ABDOMINAL AORTOGRAM W/LOWER EXTREMITY (N/A) as a surgical intervention.  The patient's history has been reviewed, patient examined, no change in status, stable for surgery.  I have reviewed the patient's chart and labs.  Questions were answered to the patient's satisfaction.     Lorine Bears

## 2022-12-30 NOTE — Progress Notes (Signed)
Patient walked to the bathroom without difficulties, right groin site, level 0, clean, dry, and intact.

## 2022-12-30 NOTE — Discharge Instructions (Signed)
Femoral Site Care This sheet gives you information about how to care for yourself after your procedure. Your health care provider may also give you more specific instructions. If you have problems or questions, contact your health care provider. What can I expect after the procedure?  After the procedure, it is common to have: Bruising that usually fades within 1-2 weeks. Tenderness at the site. Follow these instructions at home: Wound care Follow instructions from your health care provider about how to take care of your insertion site. Make sure you: Wash your hands with soap and water before you change your bandage (dressing). If soap and water are not available, use hand sanitizer. Remove your dressing as told by your health care provider. In 24 hours Do not take baths, swim, or use a hot tub until your health care provider approves. You may shower 24-48 hours after the procedure or as told by your health care provider. Gently wash the site with plain soap and water. Pat the area dry with a clean towel. Do not rub the site. This may cause bleeding. Do not apply powder or lotion to the site. Keep the site clean and dry. Check your femoral site every day for signs of infection. Check for: Redness, swelling, or pain. Fluid or blood. Warmth. Pus or a bad smell. Activity For the first 2-3 days after your procedure, or as long as directed: Avoid climbing stairs as much as possible. Do not squat. Do not lift anything that is heavier than 10 lb (4.5 kg), or the limit that you are told, until your health care provider says that it is safe. For 5 days Rest as directed. Avoid sitting for a long time without moving. Get up to take short walks every 1-2 hours. Do not drive for 24 hours if you were given a medicine to help you relax (sedative). General instructions Take over-the-counter and prescription medicines only as told by your health care provider. Keep all follow-up visits as told by  your health care provider. This is important. Contact a health care provider if you have: A fever or chills. You have redness, swelling, or pain around your insertion site. Get help right away if: The catheter insertion area swells very fast. You pass out. You suddenly start to sweat or your skin gets clammy. The catheter insertion area is bleeding, and the bleeding does not stop when you hold steady pressure on the area. The area near or just beyond the catheter insertion site becomes pale, cool, tingly, or numb. These symptoms may represent a serious problem that is an emergency. Do not wait to see if the symptoms will go away. Get medical help right away. Call your local emergency services (911 in the [place] Do not drive yourself to the hospital. Summary After the procedure, it is common to have bruising that usually fades within 1-2 weeks. Check your femoral site every day for signs of infection. Do not lift anything that is heavier than 10 lb (4.5 kg), or the limit that you are told, until your health care provider says that it is safe. This information is not intended to replace advice given to you by your health care provider. Make sure you discuss any questions you have with your health care provider. Document Revised: 04/05/2017 Document Reviewed: 04/05/2017 Elsevier Patient Education  2020 Elsevier Inc. 

## 2022-12-31 ENCOUNTER — Encounter (HOSPITAL_COMMUNITY): Payer: Self-pay | Admitting: Cardiovascular Disease

## 2023-01-04 ENCOUNTER — Telehealth: Payer: Self-pay | Admitting: Cardiovascular Disease

## 2023-01-04 DIAGNOSIS — M792 Neuralgia and neuritis, unspecified: Secondary | ICD-10-CM

## 2023-01-04 NOTE — Telephone Encounter (Signed)
Patient wants to get referral for her neuropathy.   Patient also noted the pain in her right leg is still bad.

## 2023-01-04 NOTE — Telephone Encounter (Signed)
Called and spoke with patient. Patient requesting a neurology consult for neuropathy. Patient states that since her procedure that she has increase burning to her right knee and leg.

## 2023-01-06 NOTE — Telephone Encounter (Signed)
Patient has been called and made aware that a referral has been placed. They should be reaching out to make the appointment.

## 2023-01-06 NOTE — Telephone Encounter (Signed)
Yes, lets refer to neurology for peripheral neuropathy.

## 2023-01-06 NOTE — Addendum Note (Signed)
Addended by: Sandi Mariscal on: 01/06/2023 04:36 PM   Modules accepted: Orders

## 2023-01-19 ENCOUNTER — Other Ambulatory Visit: Payer: Self-pay | Admitting: Cardiology

## 2023-01-26 ENCOUNTER — Ambulatory Visit: Payer: 59 | Attending: Cardiovascular Disease | Admitting: Cardiovascular Disease

## 2023-01-26 NOTE — Progress Notes (Deleted)
Cardiology Office Note   Date:  01/26/2023   ID:  Sonya Harmon, DOB Aug 12, 1970, MRN [id number]  PCP:  Ailene Ravel, MD  Cardiologist:   Lorine Bears, MD   No chief complaint on file.     History of Present Illness: Sonya Harmon is a 52 y.o. female who presents for a follow-up visit regarding coronary artery disease and peripheral arterial disease. She has history of tongue cancer, tobacco use, alcohol abuse and hyperlipidemia. She was hospitalized in June, 2023 with unstable angina.  She underwent cardiac catheterization by Dr. Welton Flakes from the right femoral artery which showed severe mid RCA stenosis.  PCI was planned but patient had a large groin hematoma.  She was noted to have significant bilateral iliac disease.  She underwent staged RCA PCI via the right radial artery.    The patient had significant bilateral leg claudication due to iliac disease. Angiography was performed in July 2023 which showed severely calcified aorta with no obstructive disease.  There was severe ostial left renal artery stenosis, no significant iliac disease on the right side and severe left external iliac artery stenosis extending into the distal left common iliac artery.  I performed successful drug-eluting stent placement to the left external iliac artery and drug-coated balloon angioplasty to the distal left common iliac artery.  A stent was not placed there to avoid jailing the internal iliac artery which was severely diseased at the ostium.  Postprocedure duplex showed normal iliac velocities.  She was hospitalized in July 2023 with syncope due to blood loss anemia.   She was most recently seen in July for symptoms suggestive of chronic venous insufficiency.  In addition, she started having severe bilateral burning sensation in both thighs especially on the right side over the last few months which is currently happening with minimal walking.  She underwent repeat Doppler studies which showed  mildly reduced ABI bilaterally with evidence of significant stenosis in the right common iliac artery.  She has been trying to quit smoking with Chantix but has not been successful.   Past Medical History:  Diagnosis Date   Alcohol abuse    Coronary artery disease 09/2021   s/p PCI to RCA in setting of unstable angina   History of tongue cancer    PAD (peripheral artery disease) (HCC)    Tobacco abuse     Past Surgical History:  Procedure Laterality Date   ABDOMINAL AORTOGRAM W/LOWER EXTREMITY N/A 10/15/2021   Procedure: ABDOMINAL AORTOGRAM W/LOWER EXTREMITY;  Surgeon: Iran Ouch, MD;  Location: [facility] INVASIVE CV LAB;  Service: Cardiovascular;  Laterality: N/A;   ABDOMINAL AORTOGRAM W/LOWER EXTREMITY N/A 12/30/2022   Procedure: ABDOMINAL AORTOGRAM W/LOWER EXTREMITY;  Surgeon: Iran Ouch, MD;  Location: [facility];  Service: Cardiovascular;  Laterality: N/A;   BILATERAL SALPINGECTOMY     COLONOSCOPY WITH PROPOFOL N/A 03/11/2022   Procedure: COLONOSCOPY WITH PROPOFOL;  Surgeon: Toney Reil, MD;  Location: [facility] ENDOSCOPY;  Service: Gastroenterology;  Laterality: N/A;   CORONARY BALLOON ANGIOPLASTY N/A 09/08/2021   Procedure: CORONARY BALLOON ANGIOPLASTY - ABORTED;  Surgeon: Iran Ouch, MD;  Location: [facility] INVASIVE CV LAB;  Service: Cardiovascular;  Laterality: N/A;   CORONARY STENT INTERVENTION N/A 09/10/2021   Procedure: CORONARY STENT INTERVENTION;  Surgeon: Iran Ouch, MD;  Location: [facility] INVASIVE CV LAB;  Service: Cardiovascular;  Laterality: N/A;   ESOPHAGOGASTRODUODENOSCOPY (EGD) WITH PROPOFOL N/A 11/01/2021   Procedure: ESOPHAGOGASTRODUODENOSCOPY (EGD) WITH PROPOFOL;  Surgeon: Lannette Donath  Betti Cruz, MD;  Location: [facility] ENDOSCOPY;  Service: Gastroenterology;  Laterality: N/A;   ESOPHAGOGASTRODUODENOSCOPY (EGD) WITH PROPOFOL N/A 03/11/2022   Procedure: ESOPHAGOGASTRODUODENOSCOPY (EGD) WITH PROPOFOL;  Surgeon: Toney Reil, MD;  Location: [facility]  ENDOSCOPY;  Service: Gastroenterology;  Laterality: N/A;   LEFT HEART CATH AND CORONARY ANGIOGRAPHY N/A 09/08/2021   Procedure: LEFT HEART CATH AND CORONARY ANGIOGRAPHY;  Surgeon: Laurier Nancy, MD;  Location: [facility] INVASIVE CV LAB;  Service: Cardiovascular;  Laterality: N/A;   PERIPHERAL VASCULAR INTERVENTION  10/15/2021   Procedure: PERIPHERAL VASCULAR INTERVENTION;  Surgeon: Iran Ouch, MD;  Location: [facility];  Service: Cardiovascular;;   TONGUE SURGERY       Current Outpatient Medications  Medication Sig Dispense Refill   atorvastatin (LIPITOR) 80 MG tablet TAKE 1 TABLET BY MOUTH EVERY DAY 90 tablet 0   buPROPion (WELLBUTRIN SR) 150 MG 12 hr tablet Take 1 tablet (150 mg total) by mouth 2 (two) times daily. 60 tablet 0   clopidogrel (PLAVIX) 75 MG tablet Take 1 tablet (75 mg total) by mouth daily. 90 tablet 3   ezetimibe (ZETIA) 10 MG tablet Take 1 tablet (10 mg total) by mouth daily. (Patient not taking: Reported on 12/22/2022) 90 tablet 3   gabapentin (NEURONTIN) 300 MG capsule Take 300 mg by mouth 3 (three) times daily.     metoprolol tartrate (LOPRESSOR) 25 MG tablet Take 1 tablet (25 mg total) by mouth 2 (two) times daily. 180 tablet 3   omeprazole (PRILOSEC) 40 MG capsule Take 1 capsule (40 mg total) by mouth daily. 90 capsule 3   varenicline (CHANTIX CONTINUING MONTH PAK) 1 MG tablet Take 1 tablet (1 mg total) by mouth 2 (two) times daily. (Patient not taking: Reported on 12/22/2022) 60 tablet 4   No current facility-administered medications for this visit.    Allergies:   Aspirin, Amoxicill-clarithro-lansopraz, and Amoxicillin    Social History:  The patient  reports that she has been smoking cigarettes. She started smoking about 36 years ago. She has a 17.5 pack-year smoking history. She has quit using smokeless tobacco. She reports that she does not currently use alcohol after a past usage of about 42.0 standard drinks of alcohol per week. She reports that she  does not currently use drugs after having used the following drugs: Marijuana.   Family History:  The patient's family history includes Aneurysm in her sister; Cancer in her father and mother; Stroke in her sister.    ROS:  Please see the history of present illness.   Otherwise, review of systems are positive for none.   All other systems are reviewed and negative.    PHYSICAL EXAM: VS:  There were no vitals taken for this visit. , BMI There is no height or weight on file to calculate BMI. GEN: Well-developed in no acute distress. HEENT: normal  Neck: no JVD or masses.  Left carotid bruit. Cardiac: RRR; no murmurs, rubs, or gallops,no edema  Respiratory:  clear to auscultation bilaterally, normal work of breathing GI: soft, nontender, nondistended, + BS MS: no deformity or atrophy  Skin: warm and dry, no rash Neuro:  Strength and sensation are intact Psych: Alert and oriented with normal affect. Vascular: Femoral pulse: +1 bilaterally.    EKG:  EKG is not ordered today.    Recent Labs: 12/22/2022: BUN 4; Creatinine, Ser 0.66; Hemoglobin 13.9; Platelets 398; Potassium 4.7; Sodium 137    Lipid Panel    Component Value Date/Time   CHOL 177 09/09/2021  0541   TRIG 30 09/09/2021 0541   HDL 95 09/09/2021 0541   CHOLHDL 1.9 09/09/2021 0541   VLDL 6 09/09/2021 0541   LDLCALC 76 09/09/2021 0541      Wt Readings from Last 3 Encounters:  12/30/22 115 lb (52.2 kg)  12/22/22 114 lb (51.7 kg)  10/07/22 111 lb (50.3 kg)           No data to display            ASSESSMENT AND PLAN:  1.   Peripheral arterial disease: Status post status post left iliac stent.  She now reports severe bilateral leg claudication worse on the right side.  She has to stop and rest for few minutes after a short distance of walking.  Symptoms have been worsening over the last few months and recent Doppler studies showed evidence of significant right common iliac artery stenosis.  I discussed with her  management options of claudications and she feels very limited by her symptoms.  Thus, recommend proceeding with abdominal aortogram with lower extremity angiography and possible endovascular intervention.  I discussed the procedure in details as well as risk and benefits.  Planned access is via the right common femoral artery.  2. Chronic venous insufficiency: Continue leg elevation and knee-high support stockings.    3.  Coronary artery disease involving native coronary arteries without angina: She had RCA stent in 2023.  She continues to take clopidogrel.  4.  Tobacco use: She is trying to quit smoking with Chantix but has not been successful.  5.  Hyperlipidemia: Currently on atorvastatin 80 mg daily and Zetia with a target LDL of less than 70.  Most recent lipid profile showed an LDL of 76.  6.  Left carotid bruit: Carotid Doppler in January 2024 showed mild nonobstructive disease.   Disposition: Proceed with angiography next week and follow-up after.  Signed,  Lorine Bears, MD  01/26/2023 1:19 PM    [facility]

## 2023-02-03 ENCOUNTER — Other Ambulatory Visit: Payer: Self-pay | Admitting: Cardiology

## 2023-02-05 ENCOUNTER — Encounter: Payer: Self-pay | Admitting: Cardiovascular Disease

## 2023-02-08 ENCOUNTER — Telehealth: Payer: Self-pay

## 2023-02-08 ENCOUNTER — Other Ambulatory Visit: Payer: Self-pay

## 2023-02-08 ENCOUNTER — Telehealth: Payer: Self-pay | Admitting: *Deleted

## 2023-02-08 DIAGNOSIS — Z860100 Personal history of colon polyps, unspecified: Secondary | ICD-10-CM

## 2023-02-08 MED ORDER — NA SULFATE-K SULFATE-MG SULF 17.5-3.13-1.6 GM/177ML PO SOLN
1.0000 | Freq: Once | ORAL | 0 refills | Status: AC
Start: 2023-02-08 — End: 2023-02-08

## 2023-02-08 NOTE — Telephone Encounter (Signed)
Pt requesting call back to schedule colonoscopy.

## 2023-02-08 NOTE — Telephone Encounter (Signed)
Gastroenterology Pre-Procedure Review  Request Date: 03/18/23 Requesting Physician: Dr. Allegra Lai  PATIENT REVIEW QUESTIONS: The patient responded to the following health history questions as indicated:    1. Are you having any GI issues? no 2. Do you have a personal history of Polyps? yes (last colonoscopy performed by Dr. Allegra Lai 03/11/2022 recommend repeat in 1 year due to polyps 1 noted at  25 mm polyp) 3. Do you have a family history of Colon Cancer or Polyps? yes (father colon polyps) 4. Diabetes Mellitus? no 5. Joint replacements in the past 12 months?no 6. Major health problems in the past 3 months?no 7. Any artificial heart valves, MVP, or defibrillator?no 8. Cardiac history? Yes Cardiac clearance sent to heart care preop    MEDICATIONS & ALLERGIES:    Patient reports the following regarding taking any anticoagulation/antiplatelet therapy:   Plavix, Coumadin, Eliquis, Xarelto, Lovenox, Pradaxa, Brilinta, or Effient? Yes patient takes Plavix sent to heart care preop Aspirin? no  Patient confirms/reports the following medications:  Current Outpatient Medications  Medication Sig Dispense Refill   atorvastatin (LIPITOR) 80 MG tablet TAKE 1 TABLET BY MOUTH EVERY DAY 90 tablet 0   buPROPion (WELLBUTRIN SR) 150 MG 12 hr tablet TAKE 1 TABLET BY MOUTH TWICE A DAY 180 tablet 1   clopidogrel (PLAVIX) 75 MG tablet Take 1 tablet (75 mg total) by mouth daily. 90 tablet 3   ezetimibe (ZETIA) 10 MG tablet Take 1 tablet (10 mg total) by mouth daily. (Patient not taking: Reported on 12/22/2022) 90 tablet 3   gabapentin (NEURONTIN) 300 MG capsule Take 300 mg by mouth 3 (three) times daily.     metoprolol tartrate (LOPRESSOR) 25 MG tablet Take 1 tablet (25 mg total) by mouth 2 (two) times daily. 180 tablet 3   omeprazole (PRILOSEC) 40 MG capsule Take 1 capsule (40 mg total) by mouth daily. 90 capsule 3   varenicline (CHANTIX CONTINUING MONTH PAK) 1 MG tablet Take 1 tablet (1 mg total) by mouth 2 (two)  times daily. (Patient not taking: Reported on 12/22/2022) 60 tablet 4   No current facility-administered medications for this visit.    Patient confirms/reports the following allergies:  Allergies  Allergen Reactions   Aspirin Other (See Comments)    Bleeding ulcers   Amoxicill-Clarithro-Lansopraz Nausea And Vomiting   Amoxicillin Rash    No orders of the defined types were placed in this encounter.   AUTHORIZATION INFORMATION Primary Insurance: 1D#: Group #:  Secondary Insurance: 1D#: Group #:  SCHEDULE INFORMATION: Date: 03/18/23 Time: Location: [facility]

## 2023-02-08 NOTE — Telephone Encounter (Signed)
okay

## 2023-02-08 NOTE — Telephone Encounter (Signed)
   Pre-operative Risk Assessment    Patient Name: Sonya Harmon  DOB: Aug 19, 1970 MRN: [id number]      Request for Surgical Clearance    Procedure:   Colonoscopy  Date of Surgery:  Clearance 03/18/23                                 Surgeon:  Dr. Karene Fry Group or Practice Name:  [facility] Phone number:  [phone] Fax number:  [phone]   Type of Clearance Requested:   - Medical  - Pharmacy:  Hold Clopidogrel (Plavix) Not Indicated.   Type of Anesthesia:  General    Additional requests/questions:   Last OV: Dr. Kirke Corin 12/22/2022 Upcoming OV: Dr.Arida 04/01/2023  Signed, Emmit Pomfret   02/08/2023, 12:46 PM

## 2023-02-09 NOTE — Telephone Encounter (Signed)
Dr. Kirke Corin,  You saw this patient on 12/22/2022. Per office protocol, will you please comment on medical clearance for colonoscopy on 12/12?  Please route your response to P CV DIV Preop. I will communicate with requesting office once you have given recommendations.   Thank you!  Carlos Levering, NP

## 2023-02-10 NOTE — Telephone Encounter (Signed)
   Patient Name: Sonya Harmon  DOB: 09/30/1970 MRN: [id number]  Primary Cardiologist: Julien Nordmann, MD  Chart reviewed as part of pre-operative protocol coverage. Given past medical history and time since last visit, based on ACC/AHA guidelines, CAMILA MAITA is at acceptable risk for the planned procedure without further cardiovascular testing.   The patient was advised that if she develops new symptoms prior to surgery to contact our office to arrange for a follow-up visit, and she verbalized understanding.  Per Dr. Kirke Corin patient can hold Plavix 5 days prior to procedure and should resume postprocedure when surgically safe and hemostasis is achieved.  I will route this recommendation to the requesting party via Epic fax function and remove from pre-op pool.  Please call with questions.  Napoleon Form, Leodis Rains, NP 02/10/2023, 11:36 AM

## 2023-02-10 NOTE — Telephone Encounter (Signed)
Low risk.  Plavix can be held 5 days before and should be resumed after.

## 2023-02-11 ENCOUNTER — Telehealth: Payer: Self-pay

## 2023-02-11 NOTE — Telephone Encounter (Signed)
   Patient has been advised " Per Dr. Kirke Corin patient can hold Plavix 5 days prior to procedure and should resume postprocedure when surgically safe and hemostasis is achieved".   Pt verbalized understanding.  ThanksMarcelino Duster, [place] [facility] [phone]

## 2023-02-20 ENCOUNTER — Other Ambulatory Visit: Payer: Self-pay

## 2023-02-20 ENCOUNTER — Emergency Department: Payer: 59

## 2023-02-20 ENCOUNTER — Emergency Department
Admission: EM | Admit: 2023-02-20 | Discharge: 2023-02-21 | Disposition: A | Discharge status: Home / Self Care | Payer: 59 | Attending: Emergency Medicine | Admitting: Emergency Medicine

## 2023-02-20 DIAGNOSIS — Z79899 Other long term (current) drug therapy: Secondary | ICD-10-CM | POA: Insufficient documentation

## 2023-02-20 DIAGNOSIS — I10 Essential (primary) hypertension: Secondary | ICD-10-CM | POA: Insufficient documentation

## 2023-02-20 DIAGNOSIS — R112 Nausea with vomiting, unspecified: Secondary | ICD-10-CM

## 2023-02-20 DIAGNOSIS — R1011 Right upper quadrant pain: Secondary | ICD-10-CM | POA: Insufficient documentation

## 2023-02-20 DIAGNOSIS — K5730 Diverticulosis of large intestine without perforation or abscess without bleeding: Secondary | ICD-10-CM | POA: Diagnosis not present

## 2023-02-20 DIAGNOSIS — R079 Chest pain, unspecified: Secondary | ICD-10-CM | POA: Diagnosis not present

## 2023-02-20 DIAGNOSIS — K760 Fatty (change of) liver, not elsewhere classified: Secondary | ICD-10-CM | POA: Diagnosis not present

## 2023-02-20 DIAGNOSIS — R109 Unspecified abdominal pain: Secondary | ICD-10-CM | POA: Diagnosis not present

## 2023-02-20 DIAGNOSIS — R197 Diarrhea, unspecified: Secondary | ICD-10-CM | POA: Diagnosis not present

## 2023-02-20 DIAGNOSIS — R002 Palpitations: Secondary | ICD-10-CM | POA: Diagnosis not present

## 2023-02-20 LAB — BASIC METABOLIC PANEL
Anion gap: 21 — ABNORMAL HIGH (ref 5–15)
BUN: 14 mg/dL (ref 6–20)
CO2: 11 mmol/L — ABNORMAL LOW (ref 22–32)
Calcium: 8.8 mg/dL — ABNORMAL LOW (ref 8.9–10.3)
Chloride: 102 mmol/L (ref 98–111)
Creatinine, Ser: 0.81 mg/dL (ref 0.44–1.00)
GFR, Estimated: >60 mL/min (ref >60)
Glucose, Bld: 85 mg/dL (ref 70–99)
Potassium: 4.2 mmol/L (ref 3.5–5.1)
Sodium: 134 mmol/L — ABNORMAL LOW (ref 135–145)

## 2023-02-20 LAB — LIPASE, BLOOD: Lipase: 27 U/L (ref 11–51)

## 2023-02-20 LAB — CBC
HCT: 46 % (ref 36.0–46.0)
Hemoglobin: 15.5 g/dL — ABNORMAL HIGH (ref 12.0–15.0)
MCH: 33.5 pg (ref 26.0–34.0)
MCHC: 33.7 g/dL (ref 30.0–36.0)
MCV: 99.6 fL (ref 80.0–100.0)
Platelets: 393 10*3/uL (ref 150–400)
RBC: 4.62 MIL/uL (ref 3.87–5.11)
RDW: 14.3 % (ref 11.5–15.5)
WBC: 11.2 10*3/uL — ABNORMAL HIGH (ref 4.0–10.5)
nRBC: 0 % (ref 0.0–0.2)

## 2023-02-20 LAB — TROPONIN I (HIGH SENSITIVITY)
Troponin I (High Sensitivity): 16 ng/L (ref <18)
Troponin I (High Sensitivity): 26 ng/L — ABNORMAL HIGH (ref <18)

## 2023-02-20 LAB — HEPATIC FUNCTION PANEL
ALT: 24 U/L (ref 0–44)
AST: 40 U/L (ref 15–41)
Albumin: 4.7 g/dL (ref 3.5–5.0)
Alkaline Phosphatase: 70 U/L (ref 38–126)
Bilirubin, Direct: 0.1 mg/dL (ref 0.0–0.2)
Indirect Bilirubin: 1.3 mg/dL — ABNORMAL HIGH (ref 0.3–0.9)
Total Bilirubin: 1.4 mg/dL — ABNORMAL HIGH (ref <1.2)
Total Protein: 8 g/dL (ref 6.5–8.1)

## 2023-02-20 MED ORDER — MORPHINE SULFATE (PF) 4 MG/ML IV SOLN
4.0000 mg | Freq: Once | INTRAVENOUS | Status: AC
Start: 2023-02-20 — End: 2023-02-20
  Administered 2023-02-20: 4 mg via INTRAVENOUS
  Filled 2023-02-20: qty 1

## 2023-02-20 MED ORDER — ONDANSETRON HCL 4 MG/2ML IJ SOLN
4.0000 mg | Freq: Once | INTRAMUSCULAR | Status: AC
  Administered 2023-02-20: 4 mg via INTRAVENOUS
  Filled 2023-02-20: qty 2

## 2023-02-20 MED ORDER — SODIUM CHLORIDE 0.9 % IV BOLUS
1000.0000 mL | Freq: Once | INTRAVENOUS | Status: AC
  Administered 2023-02-20: 1000 mL via INTRAVENOUS

## 2023-02-20 NOTE — ED Provider Notes (Signed)
[facility] Provider Note    Event Date/Time   First MD Initiated Contact with Patient 02/20/23 2258     (approximate)   History   Palpitations   HPI  Sonya Harmon is a 52 y.o. female with a history of hypertension and hyperlipidemia who presents with upper quadrant pain for the last several days of nausea, vomiting, and some loose stools.  The patient noted today that she felt shaky and weak and had an elevated blood pressure.  She denies any drug use.  She states she is an occasional drinker but has not had any alcohol today and is not a daily drinker.  She denies any history of gallbladder disease or other abdominal surgeries.  I reviewed the past medical records.  The patient's most recent outpatient encounter was with physical medicine at [facility] clinic on 7/17 for degenerative disc disease.   Physical Exam   Triage Vital Signs: ED Triage Vitals [02/20/23 1846]  Encounter Vitals Group     BP (!) 208/119     Systolic BP Percentile      Diastolic BP Percentile      Pulse Rate (!) 140     Resp 18     Temp (!) 97.5 F (36.4 C)     Temp Source Oral     SpO2 100 %     Weight      Height      Head Circumference      Peak Flow      Pain Score 8     Pain Loc      Pain Education      Exclude from Growth Chart     Most recent vital signs: Vitals:   02/21/23 0615 02/21/23 0624  BP:    Pulse: 74   Resp: 16   Temp:  98 F (36.7 C)  SpO2: 99%      General: Awake, no distress.  CV:  Good peripheral perfusion.  Resp:  Normal effort.  Abd:  No distention.  Other:     ED Results / Procedures / Treatments   Labs (all labs ordered are listed, but only abnormal results are displayed) Labs Reviewed  BASIC METABOLIC PANEL - Abnormal; Notable for the following components:      Result Value   Sodium 134 (*)    CO2 11 (*)    Calcium 8.8 (*)    Anion gap 21 (*)    All other components within normal limits  CBC - Abnormal; Notable for  the following components:   WBC 11.2 (*)    Hemoglobin 15.5 (*)    All other components within normal limits  URINALYSIS, ROUTINE W REFLEX MICROSCOPIC - Abnormal; Notable for the following components:   Color, Urine STRAW (*)    APPearance CLEAR (*)    Specific Gravity, Urine 1.036 (*)    Ketones, ur 80 (*)    Leukocytes,Ua SMALL (*)    All other components within normal limits  URINE DRUG SCREEN, QUALITATIVE ([facility] ONLY) - Abnormal; Notable for the following components:   Opiate, Ur Screen POSITIVE (*)    Cannabinoid 50 Ng, Ur Pulcifer POSITIVE (*)    All other components within normal limits  HEPATIC FUNCTION PANEL - Abnormal; Notable for the following components:   Total Bilirubin 1.4 (*)    Indirect Bilirubin 1.3 (*)    All other components within normal limits  BASIC METABOLIC PANEL - Abnormal; Notable for the following components:   Sodium  134 (*)    CO2 17 (*)    Calcium 8.6 (*)    Anion gap 16 (*)    All other components within normal limits  TROPONIN I (HIGH SENSITIVITY) - Abnormal; Notable for the following components:   Troponin I (High Sensitivity) 26 (*)    All other components within normal limits  TROPONIN I (HIGH SENSITIVITY) - Abnormal; Notable for the following components:   Troponin I (High Sensitivity) 26 (*)    All other components within normal limits  TROPONIN I (HIGH SENSITIVITY) - Abnormal; Notable for the following components:   Troponin I (High Sensitivity) 22 (*)    All other components within normal limits  LIPASE, BLOOD  TROPONIN I (HIGH SENSITIVITY)     EKG  ED ECG REPORT I, Dionne Bucy, the attending physician, personally viewed and interpreted this ECG.  Date: 02/20/2023 EKG Time: 1848 Rate: Approximately 100 Rhythm: Indeterminate QRS Axis: normal Intervals: normal ST/T Wave abnormalities: Nonspecific abnormalities Narrative Interpretation: no evidence of acute ischemia; EKG difficult to interpret due to poor quality baseline   ED  ECG REPORT I, Dionne Bucy, the attending physician, personally viewed and interpreted this ECG.  Date: 02/20/2023 EKG Time: 2343 Rate: 92 Rhythm: normal sinus rhythm QRS Axis: normal Intervals: normal ST/T Wave abnormalities: Nonspecific T wave abnormalities and early repolarization Narrative Interpretation: Nonspecific abnormalities with no evidence of acute ischemia   RADIOLOGY  Chest x-ray: I independently viewed and interpreted the images; there is no focal consolidation or edema  US abdomen RUQ:  IMPRESSION:  Fatty liver, otherwise unremarkable right upper quadrant ultrasound.      Electronically Signed    CT abdomen/pelvis:  IMPRESSION:  No acute findings in the abdomen or pelvis.    Hepatic steatosis.    Heavily calcified aorta and iliac vessels. Probable stenosis within  the proximal right common iliac artery.    PROCEDURES:  Critical Care performed: No  Procedures   MEDICATIONS ORDERED IN ED: Medications  sodium chloride 0.9 % bolus 1,000 mL (0 mLs Intravenous Stopped 02/21/23 0216)  morphine (PF) 4 MG/ML injection 4 mg (4 mg Intravenous Given 02/20/23 2328)  ondansetron (ZOFRAN) injection 4 mg (4 mg Intravenous Given 02/20/23 2327)  iohexol (OMNIPAQUE) 300 MG/ML solution 100 mL (100 mLs Intravenous Contrast Given 02/21/23 0008)     IMPRESSION / MDM / ASSESSMENT AND PLAN / ED COURSE  I reviewed the triage vital signs and the nursing notes.  52 year old female with PMH as noted above presents with right upper quadrant pain for the last few days associated with nausea, vomiting, and loose stools.  She also reports palpitations, headache, and feeling tremulous and weak.  The patient states that she had elevated blood pressure readings at home.  On exam the patient is relatively well-appearing.  She is slightly hypertensive and tachycardic with otherwise normal vital signs.  There is mild right upper quadrant tenderness.  Differential diagnosis  includes, but is not limited to, biliary colic, cholecystitis, choledocholithiasis, pancreatitis, gastroparesis, gastritis, gastroenteritis, viral syndrome, dehydration, electrolyte abnormality, other metabolic disturbance, less likely ACS or other cardiac cause.  Initial lab workup shows normal electrolytes and mild leukocytosis.  Initial troponin is negative.  I have added on LFTs, lipase, and right upper quadrant ultrasound.  Patient's presentation is most consistent with acute presentation with potential threat to life or bodily function.  The patient is on the cardiac monitor to evaluate for evidence of arrhythmia and/or significant heart rate changes.   ----------------------------------------- 5:11 AM on 02/21/2023 -----------------------------------------  Right upper quadrant ultrasound was unremarkable.  There is no evidence of acute gallbladder pathology.  I obtained a CT abdomen for further evaluation, and this was also negative.  LFTs and lipase are within normal limits.  Repeat troponin trended slightly upwards so we obtained serial troponins over the next several hours which have remained stable.  I suspect that the minimally elevated troponin was due to the patient's hypertension and tachycardia earlier.  There is no evidence of ACS.  Initial BMP showed elevated anion gap and UA shows ketones consistent with vomiting and dehydration.  We repeated the BMP after fluids and the anion gap is normalizing.  Based on the lab abnormalities and the patient's symptoms I did consider whether she may benefit from inpatient admission and I did offer this to the patient.  However the patient states she is feeling much better and would like to go home.  She appears comfortable.  She is no longer have any active pain or vomiting.  Given the improvement in the BMP and the stable troponins and otherwise reassuring workup, she is appropriate for discharge home.  I counseled her on the results of the  workup and on return precautions; she expressed understanding.   FINAL CLINICAL IMPRESSION(S) / ED DIAGNOSES   Final diagnoses:  Right upper quadrant abdominal pain  Hypertension, unspecified type  Nausea and vomiting, unspecified vomiting type     Rx / DC Orders   ED Discharge Orders     None        Note:  This document was prepared using Dragon voice recognition software and may include unintentional dictation errors.   Dionne Bucy, MD 02/21/23 [phone]

## 2023-02-20 NOTE — ED Triage Notes (Addendum)
Pt c/o headache, shakiness, palpitations, right sided rib pain, and high blood pressure. 185/133 at home. Pt has N/V/D. Pt is AOX4, NAD- is tremulous and hyperverbal. Insect found on pt- bottled and labelled.

## 2023-02-21 ENCOUNTER — Emergency Department: Payer: 59

## 2023-02-21 DIAGNOSIS — K760 Fatty (change of) liver, not elsewhere classified: Secondary | ICD-10-CM | POA: Diagnosis not present

## 2023-02-21 DIAGNOSIS — R109 Unspecified abdominal pain: Secondary | ICD-10-CM | POA: Diagnosis not present

## 2023-02-21 DIAGNOSIS — K5730 Diverticulosis of large intestine without perforation or abscess without bleeding: Secondary | ICD-10-CM | POA: Diagnosis not present

## 2023-02-21 LAB — URINE DRUG SCREEN, QUALITATIVE (ARMC ONLY)
Amphetamines, Ur Screen: NOT DETECTED
Barbiturates, Ur Screen: NOT DETECTED
Benzodiazepine, Ur Scrn: NOT DETECTED
Cannabinoid 50 Ng, Ur ~~LOC~~: POSITIVE — AB
Cocaine Metabolite,Ur ~~LOC~~: NOT DETECTED
MDMA (Ecstasy)Ur Screen: NOT DETECTED
Methadone Scn, Ur: NOT DETECTED
Opiate, Ur Screen: POSITIVE — AB
Phencyclidine (PCP) Ur S: NOT DETECTED
Tricyclic, Ur Screen: NOT DETECTED

## 2023-02-21 LAB — BASIC METABOLIC PANEL
Anion gap: 16 — ABNORMAL HIGH (ref 5–15)
BUN: 11 mg/dL (ref 6–20)
CO2: 17 mmol/L — ABNORMAL LOW (ref 22–32)
Calcium: 8.6 mg/dL — ABNORMAL LOW (ref 8.9–10.3)
Chloride: 101 mmol/L (ref 98–111)
Creatinine, Ser: 0.54 mg/dL (ref 0.44–1.00)
GFR, Estimated: >60 mL/min (ref >60)
Glucose, Bld: 74 mg/dL (ref 70–99)
Potassium: 4.1 mmol/L (ref 3.5–5.1)
Sodium: 134 mmol/L — ABNORMAL LOW (ref 135–145)

## 2023-02-21 LAB — URINALYSIS, ROUTINE W REFLEX MICROSCOPIC
Bacteria, UA: NONE SEEN
Bilirubin Urine: NEGATIVE
Glucose, UA: NEGATIVE mg/dL
Hgb urine dipstick: NEGATIVE
Ketones, ur: 80 mg/dL — AB
Nitrite: NEGATIVE
Protein, ur: NEGATIVE mg/dL
Specific Gravity, Urine: 1.036 — ABNORMAL HIGH (ref 1.005–1.030)
pH: 6 (ref 5.0–8.0)

## 2023-02-21 LAB — TROPONIN I (HIGH SENSITIVITY)
Troponin I (High Sensitivity): 26 ng/L — ABNORMAL HIGH (ref <18)
Troponin I (High Sensitivity): 22 ng/L — ABNORMAL HIGH (ref <18)

## 2023-02-21 MED ORDER — IOHEXOL 300 MG/ML  SOLN
100.0000 mL | Freq: Once | INTRAMUSCULAR | Status: AC | PRN
  Administered 2023-02-21: 100 mL via INTRAVENOUS

## 2023-02-21 NOTE — Discharge Instructions (Signed)
Take your regular medications as prescribed.  Follow-up with your primary care doctor.  Return to the ER for new, worsening, or persistent severe abdominal pain, vomiting, weakness, chest pain, difficulty breathing, headache, or any other new or worsening symptoms that concern you.

## 2023-03-16 DIAGNOSIS — Z85810 Personal history of malignant neoplasm of tongue: Secondary | ICD-10-CM | POA: Diagnosis not present

## 2023-03-16 DIAGNOSIS — K140 Glossitis: Secondary | ICD-10-CM | POA: Diagnosis not present

## 2023-03-16 DIAGNOSIS — F17200 Nicotine dependence, unspecified, uncomplicated: Secondary | ICD-10-CM | POA: Diagnosis not present

## 2023-03-18 ENCOUNTER — Other Ambulatory Visit: Payer: Self-pay

## 2023-03-18 ENCOUNTER — Encounter
Admission: RE | Disposition: A | Discharge status: Home / Self Care | Payer: Self-pay | Source: Home / Self Care | Attending: Gastroenterology

## 2023-03-18 ENCOUNTER — Encounter: Payer: Self-pay | Admitting: Gastroenterology

## 2023-03-18 ENCOUNTER — Ambulatory Visit: Payer: 59 | Admitting: Anesthesiology

## 2023-03-18 ENCOUNTER — Ambulatory Visit
Admission: RE | Admit: 2023-03-18 | Discharge: 2023-03-18 | Disposition: A | Discharge status: Home / Self Care | Payer: 59 | Attending: Gastroenterology | Admitting: Gastroenterology

## 2023-03-18 DIAGNOSIS — Z87891 Personal history of nicotine dependence: Secondary | ICD-10-CM | POA: Diagnosis not present

## 2023-03-18 DIAGNOSIS — I739 Peripheral vascular disease, unspecified: Secondary | ICD-10-CM | POA: Insufficient documentation

## 2023-03-18 DIAGNOSIS — K5730 Diverticulosis of large intestine without perforation or abscess without bleeding: Secondary | ICD-10-CM | POA: Diagnosis not present

## 2023-03-18 DIAGNOSIS — Z955 Presence of coronary angioplasty implant and graft: Secondary | ICD-10-CM | POA: Insufficient documentation

## 2023-03-18 DIAGNOSIS — Z7902 Long term (current) use of antithrombotics/antiplatelets: Secondary | ICD-10-CM | POA: Diagnosis not present

## 2023-03-18 DIAGNOSIS — K644 Residual hemorrhoidal skin tags: Secondary | ICD-10-CM | POA: Diagnosis not present

## 2023-03-18 DIAGNOSIS — Z85810 Personal history of malignant neoplasm of tongue: Secondary | ICD-10-CM | POA: Insufficient documentation

## 2023-03-18 DIAGNOSIS — I2510 Atherosclerotic heart disease of native coronary artery without angina pectoris: Secondary | ICD-10-CM | POA: Insufficient documentation

## 2023-03-18 DIAGNOSIS — Z79899 Other long term (current) drug therapy: Secondary | ICD-10-CM | POA: Insufficient documentation

## 2023-03-18 DIAGNOSIS — D125 Benign neoplasm of sigmoid colon: Secondary | ICD-10-CM | POA: Insufficient documentation

## 2023-03-18 DIAGNOSIS — I25119 Atherosclerotic heart disease of native coronary artery with unspecified angina pectoris: Secondary | ICD-10-CM | POA: Diagnosis not present

## 2023-03-18 DIAGNOSIS — Z1211 Encounter for screening for malignant neoplasm of colon: Secondary | ICD-10-CM | POA: Insufficient documentation

## 2023-03-18 DIAGNOSIS — D122 Benign neoplasm of ascending colon: Secondary | ICD-10-CM | POA: Diagnosis not present

## 2023-03-18 DIAGNOSIS — Z860100 Personal history of colon polyps, unspecified: Secondary | ICD-10-CM

## 2023-03-18 DIAGNOSIS — F17210 Nicotine dependence, cigarettes, uncomplicated: Secondary | ICD-10-CM | POA: Insufficient documentation

## 2023-03-18 DIAGNOSIS — R059 Cough, unspecified: Secondary | ICD-10-CM | POA: Diagnosis not present

## 2023-03-18 DIAGNOSIS — K635 Polyp of colon: Secondary | ICD-10-CM | POA: Diagnosis not present

## 2023-03-18 DIAGNOSIS — I10 Essential (primary) hypertension: Secondary | ICD-10-CM | POA: Diagnosis not present

## 2023-03-18 HISTORY — PX: POLYPECTOMY: SHX5525

## 2023-03-18 HISTORY — PX: COLONOSCOPY WITH PROPOFOL: SHX5780

## 2023-03-18 SURGERY — COLONOSCOPY WITH PROPOFOL
Anesthesia: General

## 2023-03-18 MED ORDER — PROPOFOL 500 MG/50ML IV EMUL
INTRAVENOUS | Status: DC | PRN
  Administered 2023-03-18: 140 ug/kg/min via INTRAVENOUS

## 2023-03-18 MED ORDER — DEXMEDETOMIDINE HCL IN NACL 80 MCG/20ML IV SOLN: INTRAVENOUS | Status: DC | PRN

## 2023-03-18 MED ORDER — PROPOFOL 10 MG/ML IV BOLUS
INTRAVENOUS | Status: DC | PRN
  Administered 2023-03-18: 70 mg via INTRAVENOUS

## 2023-03-18 MED ORDER — SODIUM CHLORIDE 0.9 % IV SOLN: INTRAVENOUS | Status: DC

## 2023-03-18 MED ORDER — LIDOCAINE HCL (CARDIAC) PF 100 MG/5ML IV SOSY
PREFILLED_SYRINGE | INTRAVENOUS | Status: DC | PRN
  Administered 2023-03-18: 60 mg via INTRAVENOUS

## 2023-03-18 MED ORDER — EPHEDRINE SULFATE (PRESSORS) 50 MG/ML IJ SOLN
INTRAMUSCULAR | Status: DC | PRN
  Administered 2023-03-18 (×2): 5 mg via INTRAVENOUS

## 2023-03-18 NOTE — H&P (Signed)
Arlyss Repress, MD [address]  [address]  [place], [place] [address]  Main: [phone]  Fax: [phone] Pager: [phone]  Primary Care Physician:  Ailene Ravel, MD Primary Gastroenterologist:  Dr. Arlyss Repress  Pre-Procedure History & Physical: HPI:  Sonya Harmon is a 52 y.o. female is here for an colonoscopy.   Past Medical History:  Diagnosis Date   Alcohol abuse    Coronary artery disease 09/2021   s/p PCI to RCA in setting of unstable angina   History of tongue cancer    PAD (peripheral artery disease) (HCC)    Tobacco abuse     Past Surgical History:  Procedure Laterality Date   ABDOMINAL AORTOGRAM W/LOWER EXTREMITY N/A 10/15/2021   Procedure: ABDOMINAL AORTOGRAM W/LOWER EXTREMITY;  Surgeon: Iran Ouch, MD;  Location: [facility] INVASIVE CV LAB;  Service: Cardiovascular;  Laterality: N/A;   ABDOMINAL AORTOGRAM W/LOWER EXTREMITY N/A 12/30/2022   Procedure: ABDOMINAL AORTOGRAM W/LOWER EXTREMITY;  Surgeon: Iran Ouch, MD;  Location: [facility]AS[facility] CV LAB;  Service: Cardiovascular;  Laterality: N/A;   BILATERAL SALPINGECTOMY     COLONOSCOPY WITH PROPOFOL N/A 03/11/2022   Procedure: COLONOSCOPY WITH PROPOFOL;  Surgeon: Toney Reil, MD;  Location: [facility] ENDOSCOPY;  Service: Gastroenterology;  Laterality: N/A;   CORONARY BALLOON ANGIOPLASTY N/A 09/08/2021   Procedure: CORONARY BALLOON ANGIOPLASTY - ABORTED;  Surgeon: Iran Ouch, MD;  Location: [facility] INVASIVE CV LAB;  Service: Cardiovascular;  Laterality: N/A;   CORONARY STENT INTERVENTION N/A 09/10/2021   Procedure: CORONARY STENT INTERVENTION;  Surgeon: Iran Ouch, MD;  Location: [facility] INVASIVE CV LAB;  Service: Cardiovascular;  Laterality: N/A;   ESOPHAGOGASTRODUODENOSCOPY (EGD) WITH PROPOFOL N/A 11/01/2021   Procedure: ESOPHAGOGASTRODUODENOSCOPY (EGD) WITH PROPOFOL;  Surgeon: Toney Reil, MD;  Location: [facility] ENDOSCOPY;  Service: Gastroenterology;  Laterality: N/A;    ESOPHAGOGASTRODUODENOSCOPY (EGD) WITH PROPOFOL N/A 03/11/2022   Procedure: ESOPHAGOGASTRODUODENOSCOPY (EGD) WITH PROPOFOL;  Surgeon: Toney Reil, MD;  Location: [facility] ENDOSCOPY;  Service: Gastroenterology;  Laterality: N/A;   LEFT HEART CATH AND CORONARY ANGIOGRAPHY N/A 09/08/2021   Procedure: LEFT HEART CATH AND CORONARY ANGIOGRAPHY;  Surgeon: Laurier Nancy, MD;  Location: [facility] INVASIVE CV LAB;  Service: Cardiovascular;  Laterality: N/A;   PERIPHERAL VASCULAR INTERVENTION  10/15/2021   Procedure: PERIPHERAL VASCULAR INTERVENTION;  Surgeon: Iran Ouch, MD;  Location: [facility] CV LAB;  Service: Cardiovascular;;   TONGUE SURGERY      Prior to Admission medications   Medication Sig Start Date End Date Taking? Authorizing Provider  atorvastatin (LIPITOR) 80 MG tablet TAKE 1 TABLET BY MOUTH EVERY DAY 12/17/22  Yes Hammock, Sheri, NP  buPROPion (WELLBUTRIN SR) 150 MG 12 hr tablet TAKE 1 TABLET BY MOUTH TWICE A DAY 02/04/23  Yes Hammock, Sheri, NP  clopidogrel (PLAVIX) 75 MG tablet Take 1 tablet (75 mg total) by mouth daily. 08/13/22 08/13/23 Yes Gollan, Tollie Pizza, MD  ezetimibe (ZETIA) 10 MG tablet Take 1 tablet (10 mg total) by mouth daily. Patient not taking: Reported on 12/22/2022 06/08/22   Antonieta Iba, MD  gabapentin (NEURONTIN) 300 MG capsule Take 300 mg by mouth 3 (three) times daily.   Yes [provider]  metoprolol tartrate (LOPRESSOR) 25 MG tablet Take 1 tablet (25 mg total) by mouth 2 (two) times daily. 09/18/22 03/18/23 Yes Hammock, Lavonna Rua, NP  omeprazole (PRILOSEC) 40 MG capsule Take 1 capsule (40 mg total) by mouth daily. 08/13/22 08/08/23 Yes Gollan, Tollie Pizza, MD  varenicline (CHANTIX CONTINUING  MONTH PAK) 1 MG tablet Take 1 tablet (1 mg total) by mouth 2 (two) times daily. Patient not taking: Reported on 12/22/2022 06/08/22   Antonieta Iba, MD    Allergies as of 02/08/2023 - Review Complete 02/08/2023  Allergen Reaction Noted   Aspirin Other (See Comments)  12/30/2022   Amoxicill-clarithro-lansopraz Nausea And Vomiting 11/22/2015   Amoxicillin Rash 09/07/2021    Family History  Problem Relation Age of Onset   Cancer Mother    Cancer Father    Stroke Sister    Aneurysm Sister     Social History   Socioeconomic History   Marital status: Married    Spouse name: Not on file   Number of children: Not on file   Years of education: Not on file   Highest education level: Not on file  Occupational History   Not on file  Tobacco Use   Smoking status: Every Day    Current packs/day: 0.00    Average packs/day: 0.5 packs/day for 35.0 years (17.5 ttl pk-yrs)    Types: Cigarettes    Start date: 09/08/1986    Last attempt to quit: 09/07/2021    Years since quitting: 1.5   Smokeless tobacco: Former  Building services engineer status: Former  Substance and Sexual Activity   Alcohol use: Not Currently    Alcohol/week: 42.0 standard drinks of alcohol    Types: 42 Cans of beer per week    Comment: quit in july 2023   Drug use: Not Currently    Types: Marijuana   Sexual activity: Yes    Partners: Male  Other Topics Concern   Not on file  Social History Narrative   ** Merged History Encounter **       Social Drivers of Corporate investment banker Strain: Not on file  Food Insecurity: Not on file  Transportation Needs: Not on file  Physical Activity: Not on file  Stress: Not on file  Social Connections: Unknown (08/17/2021)   Received from [facility], [facility]   Social Network    Social Network: Not on file  Intimate Partner Violence: Unknown (07/09/2021)   Received from [employer], [facility]   HITS    Physically Hurt: Not on file    Insult or Talk Down To: Not on file    Threaten Physical Harm: Not on file    Scream or Curse: Not on file    Review of Systems: See HPI, otherwise negative ROS  Physical Exam: BP (!) 144/84   Pulse (!) 56   Temp (!) 97.1 F (36.2 C) (Temporal)   Ht 5\' 2"  (1.575 m)   Wt 46.6 kg   SpO2  100%   BMI 18.80 kg/m  General:   Alert,  pleasant and cooperative in NAD Head:  Normocephalic and atraumatic. Neck:  Supple; no masses or thyromegaly. Lungs:  Clear throughout to auscultation.    Heart:  Regular rate and rhythm. Abdomen:  Soft, nontender and nondistended. Normal bowel sounds, without guarding, and without rebound.   Neurologic:  Alert and  oriented x4;  grossly normal neurologically.  Impression/Plan: Sonya Harmon is here for an colonoscopy to be performed for h/o large adenoma colon, piecemeal polypectomy  Risks, benefits, limitations, and alternatives regarding  colonoscopy have been reviewed with the patient.  Questions have been answered.  All parties agreeable.   Lannette Donath, MD  03/18/2023, 8:22 AM

## 2023-03-18 NOTE — Op Note (Signed)
[facility] Patient Name: Sonya Harmon Procedure Date: 03/18/2023 8:50 AM MRN: [id number] Account #: [id number] Date of Birth: July 22, 1970 Admit Type: Outpatient Age: 52 Room: [facility] ENDO ROOM 2 Gender: Female Note Status: Finalized Instrument Name: Peds Colonoscope [id number] Procedure:             Colonoscopy Indications:           Surveillance: History of piecemeal removal adenoma on                         last colonoscopy (< 3 yrs), Last colonoscopy: December                         2023 Providers:             Toney Reil MD, MD Referring MD:          Durward Fortes. Hamrick (Referring MD) Medicines:             General Anesthesia Complications:         No immediate complications. Estimated blood loss: None. Procedure:             Pre-Anesthesia Assessment:                        - Prior to the procedure, a History and Physical was                         performed, and patient medications and allergies were                         reviewed. The patient is competent. The risks and                         benefits of the procedure and the sedation options and                         risks were discussed with the patient. All questions                         were answered and informed consent was obtained.                         Patient identification and proposed procedure were                         verified by the physician, the nurse, the                         anesthesiologist, the anesthetist and the technician                         in the pre-procedure area in the procedure room in the                         endoscopy suite. Mental Status Examination: alert and                         oriented. Airway Examination: normal oropharyngeal  airway and neck mobility. Respiratory Examination:                         clear to auscultation. CV Examination: normal.                         Prophylactic Antibiotics: The  patient does not require                         prophylactic antibiotics. Prior Anticoagulants: The                         patient has taken Plavix (clopidogrel), last dose was                         5 days prior to procedure. ASA Grade Assessment: III -                         A patient with severe systemic disease. After                         reviewing the risks and benefits, the patient was                         deemed in satisfactory condition to undergo the                         procedure. The anesthesia plan was to use general                         anesthesia. Immediately prior to administration of                         medications, the patient was re-assessed for adequacy                         to receive sedatives. The heart rate, respiratory                         rate, oxygen saturations, blood pressure, adequacy of                         pulmonary ventilation, and response to care were                         monitored throughout the procedure. The physical                         status of the patient was re-assessed after the                         procedure.                        After obtaining informed consent, the colonoscope was                         passed under direct vision. Throughout the procedure,  the patient's blood pressure, pulse, and oxygen                         saturations were monitored continuously. The                         Colonoscope was introduced through the anus and                         advanced to the the terminal ileum, with                         identification of the appendiceal orifice and IC                         valve. The colonoscopy was performed without                         difficulty. The patient tolerated the procedure well.                         The quality of the bowel preparation was evaluated                         using the BBPS [facility] Bowel Preparation Scale) with                          scores of: Right Colon = 3, Transverse Colon = 3 and                         Left Colon = 3 (entire mucosa seen well with no                         residual staining, small fragments of stool or opaque                         liquid). The total BBPS score equals 9. The ileocecal                         valve, appendiceal orifice, and rectum were                         photographed. Findings:      Hemorrhoids were found on perianal exam.      Three sessile polyps were found in the sigmoid colon and ascending       colon. The polyps were 4 to 5 mm in size. These polyps were removed with       a cold snare. Resection and retrieval were complete. Estimated blood       loss: none.      Many small-mouthed diverticula were found in the sigmoid colon.       Estimated blood loss: none.      Non-bleeding external hemorrhoids were found during retroflexion. The       hemorrhoids were medium-sized.      The terminal ileum appeared normal. Impression:            - Hemorrhoids found on perianal exam.                        -  Three 4 to 5 mm polyps in the sigmoid colon and in                         the ascending colon, removed with a cold snare.                         Resected and retrieved.                        - Diverticulosis in the sigmoid colon.                        - Non-bleeding external hemorrhoids.                        - The examined portion of the ileum was normal. Recommendation:        - Discharge patient to home (with escort).                        - Resume previous diet today.                        - Continue present medications.                        - Await pathology results.                        - Repeat colonoscopy in 3 years for surveillance. Procedure Code(s):     --- Professional ---                        [phone], Colonoscopy, flexible; with removal of                         tumor(s), polyp(s), or other lesion(s) by snare                          technique Diagnosis Code(s):     --- Professional ---                        Z86.010, Personal history of colonic polyps                        D12.2, Benign neoplasm of ascending colon                        D12.5, Benign neoplasm of sigmoid colon                        K64.4, Residual hemorrhoidal skin tags                        K57.30, Diverticulosis of large intestine without                         perforation or abscess without bleeding CPT copyright 2022 American Medical Association. All rights reserved. The codes documented in this report are preliminary and upon coder review may  be revised to meet current compliance requirements. Dr. Libby Maw Caralynn Gelber Alger Memos  MD, MD 03/18/2023 9:32:22 AM This report has been signed electronically. Number of Addenda: 0 Note Initiated On: 03/18/2023 8:50 AM Scope Withdrawal Time: 0 hours 17 minutes 4 seconds  Total Procedure Duration: 0 hours 24 minutes 2 seconds  Estimated Blood Loss:  Estimated blood loss: none. Estimated blood loss: none.      [facility]

## 2023-03-18 NOTE — Transfer of Care (Signed)
Immediate Anesthesia Transfer of Care Note  Patient: Sonya Harmon  Procedure(s) Performed: COLONOSCOPY WITH PROPOFOL POLYPECTOMY  Patient Location: PACU  Anesthesia Type:General  Level of Consciousness: awake, alert , and oriented  Airway & Oxygen Therapy: Patient Spontanous Breathing  Post-op Assessment: Report given to RN and Post -op Vital signs reviewed and stable  Post vital signs: Reviewed and stable  Last Vitals:  Vitals Value Taken Time  BP 93/66 03/18/23 0935  Temp 36.1 C 03/18/23 0934  Pulse 70 03/18/23 0938  Resp 16 03/18/23 0938  SpO2 100 % 03/18/23 0938  Vitals shown include unfiled device data.  Last Pain:  Vitals:   03/18/23 0934  TempSrc: Temporal  PainSc:          Complications: No notable events documented.

## 2023-03-18 NOTE — Anesthesia Preprocedure Evaluation (Signed)
Anesthesia Evaluation  Patient identified by MRN, date of birth, ID band Patient awake    Reviewed: Allergy & Precautions, H&P , NPO status , Patient's Chart, lab work & pertinent test results, reviewed documented beta blocker date and time   History of Anesthesia Complications Negative for: history of anesthetic complications  Airway Mallampati: II  TM Distance: >3 FB Neck ROM: full    Dental  (+) Dental Advidsory Given, Edentulous Upper, Upper Dentures, Poor Dentition   Pulmonary neg shortness of breath, neg COPD, Recent URI , Residual Cough, Current Smoker and Patient abstained from smoking., former smoker   Pulmonary exam normal breath sounds clear to auscultation       Cardiovascular Exercise Tolerance: Good (-) hypertension(-) angina + CAD, + Past MI, + Cardiac Stents and + Peripheral Vascular Disease  (-) CABG Normal cardiovascular exam(-) dysrhythmias (-) Valvular Problems/Murmurs Rhythm:regular Rate:Normal     Neuro/Psych negative neurological ROS  negative psych ROS   GI/Hepatic negative GI ROS, Neg liver ROS,,,  Endo/Other  negative endocrine ROS    Renal/GU negative Renal ROS  negative genitourinary   Musculoskeletal   Abdominal   Peds  Hematology negative hematology ROS (+)   Anesthesia Other Findings Past Medical History: No date: Alcohol abuse 09/2021: Coronary artery disease     Comment:  s/p PCI to RCA in setting of unstable angina No date: History of tongue cancer No date: PAD (peripheral artery disease) (HCC) No date: Tobacco abuse   Reproductive/Obstetrics negative OB ROS                             Anesthesia Physical Anesthesia Plan  ASA: 3  Anesthesia Plan: General   Post-op Pain Management:    Induction: Intravenous  PONV Risk Score and Plan: Propofol infusion and TIVA  Airway Management Planned: Natural Airway and Nasal Cannula  Additional  Equipment:   Intra-op Plan:   Post-operative Plan:   Informed Consent: I have reviewed the patients History and Physical, chart, labs and discussed the procedure including the risks, benefits and alternatives for the proposed anesthesia with the patient or authorized representative who has indicated his/her understanding and acceptance.     Dental Advisory Given  Plan Discussed with: Anesthesiologist, CRNA and Surgeon  Anesthesia Plan Comments:         Anesthesia Quick Evaluation

## 2023-03-19 ENCOUNTER — Encounter: Payer: Self-pay | Admitting: Gastroenterology

## 2023-03-19 LAB — SURGICAL PATHOLOGY

## 2023-03-22 ENCOUNTER — Encounter: Payer: Self-pay | Admitting: Gastroenterology

## 2023-03-25 NOTE — Anesthesia Postprocedure Evaluation (Signed)
Anesthesia Post Note  Patient: Sonya Harmon  Procedure(s) Performed: COLONOSCOPY WITH PROPOFOL POLYPECTOMY  Patient location during evaluation: Endoscopy Anesthesia Type: General Level of consciousness: awake and alert Pain management: pain level controlled Vital Signs Assessment: post-procedure vital signs reviewed and stable Respiratory status: spontaneous breathing, nonlabored ventilation, respiratory function stable and patient connected to nasal cannula oxygen Cardiovascular status: blood pressure returned to baseline and stable Postop Assessment: no apparent nausea or vomiting Anesthetic complications: no   No notable events documented.   Last Vitals:  Vitals:   03/18/23 0934 03/18/23 0954  BP: 93/66   Pulse: 68   Temp: (!) 36.1 C   SpO2: 100% 99%    Last Pain:  Vitals:   03/19/23 0833  TempSrc:   PainSc: 0-No pain                 Lenard Simmer

## 2023-04-01 ENCOUNTER — Encounter: Payer: Self-pay | Admitting: Cardiovascular Disease

## 2023-04-01 ENCOUNTER — Ambulatory Visit: Payer: 59 | Attending: Cardiovascular Disease | Admitting: Cardiovascular Disease

## 2023-04-01 VITALS — BP 120/70 | HR 74 | Ht 62.0 in | Wt 105.2 lb

## 2023-04-01 DIAGNOSIS — I2510 Atherosclerotic heart disease of native coronary artery without angina pectoris: Secondary | ICD-10-CM | POA: Diagnosis not present

## 2023-04-01 DIAGNOSIS — I872 Venous insufficiency (chronic) (peripheral): Secondary | ICD-10-CM

## 2023-04-01 DIAGNOSIS — Z720 Tobacco use: Secondary | ICD-10-CM | POA: Diagnosis not present

## 2023-04-01 DIAGNOSIS — R0989 Other specified symptoms and signs involving the circulatory and respiratory systems: Secondary | ICD-10-CM | POA: Diagnosis not present

## 2023-04-01 DIAGNOSIS — I739 Peripheral vascular disease, unspecified: Secondary | ICD-10-CM | POA: Diagnosis not present

## 2023-04-01 DIAGNOSIS — E785 Hyperlipidemia, unspecified: Secondary | ICD-10-CM

## 2023-04-01 MED ORDER — VARENICLINE TARTRATE (STARTER) 0.5 MG X 11 & 1 MG X 42 PO TBPK: ORAL_TABLET | ORAL | 0 refills | Status: DC

## 2023-04-01 MED ORDER — VARENICLINE TARTRATE 1 MG PO TABS: 1.0000 mg | ORAL_TABLET | Freq: Two times a day (BID) | ORAL | 1 refills | Status: AC

## 2023-04-01 NOTE — Progress Notes (Signed)
Cardiology Office Note   Date:  04/01/2023   ID:  Sonya Harmon, DOB 05/25/1970, MRN [id number]  PCP:  Ailene Ravel, MD  Cardiologist:   Lorine Bears, MD   Chief Complaint  Patient presents with   Follow-up    6 month f/u c/o elevated BP last month pt went to ED BP doing better now. Meds reviewed verbally with pt.      History of Present Illness: Sonya Harmon is a 52 y.o. female who presents for a follow-up visit regarding coronary artery disease and peripheral arterial disease. She has history of tongue cancer, tobacco use, alcohol abuse and hyperlipidemia. She was hospitalized in June, 2023 with unstable angina.  She underwent cardiac catheterization by Dr. Welton Flakes from the right femoral artery which showed severe mid RCA stenosis.  PCI was planned but patient had a large groin hematoma.  She was noted to have significant bilateral iliac disease.  She underwent staged RCA PCI via the right radial artery.    The patient had significant bilateral leg claudication due to iliac disease. Angiography was performed in July 2023 which showed severely calcified aorta with no obstructive disease.  There was severe ostial left renal artery stenosis, no significant iliac disease on the right side and severe left external iliac artery stenosis extending into the distal left common iliac artery.  I performed successful drug-eluting stent placement to the left external iliac artery and drug-coated balloon angioplasty to the distal left common iliac artery.  A stent was not placed there to avoid jailing the internal iliac artery which was severely diseased at the ostium.  Postprocedure duplex showed normal iliac velocities.  She was hospitalized in July 2023 with syncope due to blood loss anemia.   She was seen in July for lower extremity edema as well as burning sensation in both thighs and legs especially on the right side.  ABI was mildly reduced on the right side with borderline disease in  the right common iliac artery.  I proceeded with angiography in August which showed patent left iliac stent with no obstructive disease.  On the right side, there was moderate common iliac artery stenosis not significant by gradient with minimal SFA and popliteal disease.  She has been doing reasonably well with no chest pain or worsening dyspnea.  She has no claudication but continues to have numbness and burning sensation in both legs.  Past Medical History:  Diagnosis Date   Alcohol abuse    Coronary artery disease 09/2021   s/p PCI to RCA in setting of unstable angina   History of tongue cancer    PAD (peripheral artery disease) (HCC)    Tobacco abuse     Past Surgical History:  Procedure Laterality Date   ABDOMINAL AORTOGRAM W/LOWER EXTREMITY N/A 10/15/2021   Procedure: ABDOMINAL AORTOGRAM W/LOWER EXTREMITY;  Surgeon: Iran Ouch, MD;  Location: [facility];  Service: Cardiovascular;  Laterality: N/A;   ABDOMINAL AORTOGRAM W/LOWER EXTREMITY N/A 12/30/2022   Procedure: ABDOMINAL AORTOGRAM W/LOWER EXTREMITY;  Surgeon: Iran Ouch, MD;  Location: [facility];  Service: Cardiovascular;  Laterality: N/A;   BILATERAL SALPINGECTOMY     COLONOSCOPY WITH PROPOFOL N/A 03/11/2022   Procedure: COLONOSCOPY WITH PROPOFOL;  Surgeon: Toney Reil, MD;  Location: [facility] ENDOSCOPY;  Service: Gastroenterology;  Laterality: N/A;   COLONOSCOPY WITH PROPOFOL N/A 03/18/2023   Procedure: COLONOSCOPY WITH PROPOFOL;  Surgeon: Toney Reil, MD;  Location: [facility] ENDOSCOPY;  Service: Gastroenterology;  Laterality: N/A;   CORONARY BALLOON ANGIOPLASTY N/A 09/08/2021   Procedure: CORONARY BALLOON ANGIOPLASTY - ABORTED;  Surgeon: Iran Ouch, MD;  Location: [facility] INVASIVE CV LAB;  Service: Cardiovascular;  Laterality: N/A;   CORONARY STENT INTERVENTION N/A 09/10/2021   Procedure: CORONARY STENT INTERVENTION;  Surgeon: Iran Ouch, MD;  Location: [facility] INVASIVE CV LAB;   Service: Cardiovascular;  Laterality: N/A;   ESOPHAGOGASTRODUODENOSCOPY (EGD) WITH PROPOFOL N/A 11/01/2021   Procedure: ESOPHAGOGASTRODUODENOSCOPY (EGD) WITH PROPOFOL;  Surgeon: Toney Reil, MD;  Location: [facility] ENDOSCOPY;  Service: Gastroenterology;  Laterality: N/A;   ESOPHAGOGASTRODUODENOSCOPY (EGD) WITH PROPOFOL N/A 03/11/2022   Procedure: ESOPHAGOGASTRODUODENOSCOPY (EGD) WITH PROPOFOL;  Surgeon: Toney Reil, MD;  Location: [facility] ENDOSCOPY;  Service: Gastroenterology;  Laterality: N/A;   LEFT HEART CATH AND CORONARY ANGIOGRAPHY N/A 09/08/2021   Procedure: LEFT HEART CATH AND CORONARY ANGIOGRAPHY;  Surgeon: Laurier Nancy, MD;  Location: [facility] INVASIVE CV LAB;  Service: Cardiovascular;  Laterality: N/A;   PERIPHERAL VASCULAR INTERVENTION  10/15/2021   Procedure: PERIPHERAL VASCULAR INTERVENTION;  Surgeon: Iran Ouch, MD;  Location: [facility];  Service: Cardiovascular;;   POLYPECTOMY  03/18/2023   Procedure: POLYPECTOMY;  Surgeon: Toney Reil, MD;  Location: [facility] ENDOSCOPY;  Service: Gastroenterology;;   TONGUE SURGERY       Current Outpatient Medications  Medication Sig Dispense Refill   atorvastatin (LIPITOR) 80 MG tablet TAKE 1 TABLET BY MOUTH EVERY DAY 90 tablet 0   buPROPion (WELLBUTRIN SR) 150 MG 12 hr tablet TAKE 1 TABLET BY MOUTH TWICE A DAY 180 tablet 1   clopidogrel (PLAVIX) 75 MG tablet Take 1 tablet (75 mg total) by mouth daily. 90 tablet 3   gabapentin (NEURONTIN) 300 MG capsule Take 300 mg by mouth 3 (three) times daily.     metoprolol tartrate (LOPRESSOR) 25 MG tablet Take 1 tablet (25 mg total) by mouth 2 (two) times daily. 180 tablet 3   omeprazole (PRILOSEC) 40 MG capsule Take 1 capsule (40 mg total) by mouth daily. 90 capsule 3   [START ON 05/02/2023] varenicline (CHANTIX CONTINUING MONTH PAK) 1 MG tablet Take 1 tablet (1 mg total) by mouth 2 (two) times daily. 60 tablet 1   Varenicline Tartrate, Starter, (CHANTIX STARTING MONTH PAK) 0.5 MG  X 11 & 1 MG X 42 TBPK Take one 0.5 mg tablet by mouth once daily for 3 days, then increase to one 0.5 mg tablet twice daily for 4 days, then increase to one 1 mg tablet twice daily. 53 each 0   No current facility-administered medications for this visit.    Allergies:   Aspirin, Amoxicill-clarithro-lansopraz, and Amoxicillin    Social History:  The patient  reports that she has been smoking cigarettes. She started smoking about 36 years ago. She has a 17.5 pack-year smoking history. She has quit using smokeless tobacco. She reports that she does not currently use alcohol after a past usage of about 42.0 standard drinks of alcohol per week. She reports that she does not currently use drugs after having used the following drugs: Marijuana.   Family History:  The patient's family history includes Aneurysm in her sister; Cancer in her father and mother; Stroke in her sister.    ROS:  Please see the history of present illness.   Otherwise, review of systems are positive for none.   All other systems are reviewed and negative.    PHYSICAL EXAM: VS:  BP 120/70 (BP Location: Left Arm, Patient Position: Sitting, Cuff  Size: Normal)   Pulse 74   Ht 5\' 2"  (1.575 m)   Wt 105 lb 4 oz (47.7 kg)   SpO2 99%   BMI 19.25 kg/m  , BMI Body mass index is 19.25 kg/m. GEN: Well-developed in no acute distress. HEENT: normal  Neck: no JVD or masses.  Left carotid bruit. Cardiac: RRR; no murmurs, rubs, or gallops,no edema  Respiratory:  clear to auscultation bilaterally, normal work of breathing GI: soft, nontender, nondistended, + BS MS: no deformity or atrophy  Skin: warm and dry, no rash Neuro:  Strength and sensation are intact Psych: Alert and oriented with normal affect. Vascular: Femoral pulse: +1 bilaterally.    EKG:  EKG is not ordered today.    Recent Labs: 02/20/2023: ALT 24; Hemoglobin 15.5; Platelets 393 02/21/2023: BUN 11; Creatinine, Ser 0.54; Potassium 4.1; Sodium 134    Lipid  Panel    Component Value Date/Time   CHOL 177 09/09/2021 0541   TRIG 30 09/09/2021 0541   HDL 95 09/09/2021 0541   CHOLHDL 1.9 09/09/2021 0541   VLDL 6 09/09/2021 0541   LDLCALC 76 09/09/2021 0541      Wt Readings from Last 3 Encounters:  04/01/23 105 lb 4 oz (47.7 kg)  03/18/23 102 lb 12.8 oz (46.6 kg)  12/30/22 115 lb (52.2 kg)           No data to display            ASSESSMENT AND PLAN:  1.   Peripheral arterial disease: Status post status post left iliac stent.  Recent angiography showed patent left iliac stent and moderate right iliac disease.  There was no evidence of obstructive disease to explain her symptoms of burning and numbness in her legs.  On current treatment with clopidogrel.   I advised her to start walking at least 30 minutes daily.  2. Chronic venous insufficiency: Continue leg elevation and knee-high support stockings.    3.  Coronary artery disease involving native coronary arteries without angina: She had RCA stent in 2023.  She continues to take clopidogrel.  4.  Tobacco use: I again discussed the importance of smoking cessation and will prescribe Chantix again.  5.  Hyperlipidemia: Continue treatment with atorvastatin 80 mg daily.  I requested lipid and liver profile today.  6.  Left carotid bruit: Carotid Doppler in January 2024 showed mild nonobstructive disease.  7.  Peripheral neuropathy: Suspect that her lower extremity numbness and burning sensation is likely due to peripheral neuropathy.  She was referred to neurology for evaluation.   Disposition: Follow-up in 6 months.  Signed,  Lorine Bears, MD  04/01/2023 12:08 PM    [facility]

## 2023-04-01 NOTE — Patient Instructions (Signed)
Medication Instructions:  Chantix:   Take one 0.5 mg tablet by mouth once daily for 3 days,  Then increase to one 0.5 mg tablet twice daily for 4 days,  Then increase to one 1 mg tablet twice daily. This is what you will remain on until completed. Two prescriptions have been sent into your pharmacy. One for the starter pack for the first month and then the one that will be started after the first month. You may pick up the Chantix 1 mg twice daily after the starter pack has been completed.   *If you need a refill on your cardiac medications before your next appointment, please call your pharmacy*   Lab Work: Your provider would like for you to have following labs drawn today fasting Lipid and Liver.   If you have labs (blood work) drawn today and your tests are completely normal, you will receive your results only by: MyChart Message (if you have MyChart) OR A paper copy in the mail If you have any lab test that is abnormal or we need to change your treatment, we will call you to review the results.   Testing/Procedures: None ordered   Follow-Up: At [facility], you and your health needs are our priority.  As part of our continuing mission to provide you with exceptional heart care, we have created designated Provider Care Teams.  These Care Teams include your primary Cardiologist (physician) and Advanced Practice Providers (APPs -  Physician Assistants and Nurse Practitioners) who all work together to provide you with the care you need, when you need it.  We recommend signing up for the patient portal called "MyChart".  Sign up information is provided on this After Visit Summary.  MyChart is used to connect with patients for Virtual Visits (Telemedicine).  Patients are able to view lab/test results, encounter notes, upcoming appointments, etc.  Non-urgent messages can be sent to your provider as well.   To learn more about what you can do with MyChart, go to  [email or web address].    Your next appointment:   6 month(s)  Provider:   Dr. Kirke Corin   The referral was made to Dr. Sherryll Burger, neurologist, at [phone]

## 2023-04-02 LAB — HEPATIC FUNCTION PANEL
ALT: 25 [IU]/L (ref 0–32)
AST: 46 [IU]/L — ABNORMAL HIGH (ref 0–40)
Albumin: 4.7 g/dL (ref 3.9–4.9)
Alkaline Phosphatase: 82 [IU]/L (ref 44–121)
Bilirubin Total: 0.9 mg/dL (ref 0.0–1.2)
Bilirubin, Direct: 0.29 mg/dL (ref 0.00–0.40)
Total Protein: 7.4 g/dL (ref 6.0–8.5)

## 2023-04-02 LAB — LIPID PANEL
Chol/HDL Ratio: 2 {ratio} (ref 0.0–4.4)
Cholesterol, Total: 202 mg/dL — ABNORMAL HIGH (ref 100–199)
HDL: 101 mg/dL (ref >39)
LDL Chol Calc (NIH): 91 mg/dL (ref 0–99)
Triglycerides: 54 mg/dL (ref 0–149)
VLDL Cholesterol Cal: 10 mg/dL (ref 5–40)

## 2023-04-02 NOTE — Progress Notes (Signed)
Cholesterol is elevated and LDL has increased. Please make sure she is taking her atorvastatin 80 mg daily.

## 2023-05-06 ENCOUNTER — Other Ambulatory Visit: Payer: Self-pay | Admitting: Family Medicine

## 2023-05-06 DIAGNOSIS — Z85810 Personal history of malignant neoplasm of tongue: Secondary | ICD-10-CM | POA: Diagnosis not present

## 2023-05-06 DIAGNOSIS — J209 Acute bronchitis, unspecified: Secondary | ICD-10-CM | POA: Diagnosis not present

## 2023-05-06 DIAGNOSIS — Z1231 Encounter for screening mammogram for malignant neoplasm of breast: Secondary | ICD-10-CM

## 2023-05-06 DIAGNOSIS — R197 Diarrhea, unspecified: Secondary | ICD-10-CM | POA: Diagnosis not present

## 2023-05-06 DIAGNOSIS — R112 Nausea with vomiting, unspecified: Secondary | ICD-10-CM | POA: Diagnosis not present

## 2023-05-06 DIAGNOSIS — Z87891 Personal history of nicotine dependence: Secondary | ICD-10-CM | POA: Diagnosis not present

## 2023-05-06 DIAGNOSIS — R6889 Other general symptoms and signs: Secondary | ICD-10-CM | POA: Diagnosis not present

## 2023-05-07 ENCOUNTER — Other Ambulatory Visit: Payer: Self-pay | Admitting: Family Medicine

## 2023-05-07 DIAGNOSIS — Z87891 Personal history of nicotine dependence: Secondary | ICD-10-CM

## 2023-05-19 ENCOUNTER — Ambulatory Visit
Admission: RE | Admit: 2023-05-19 | Discharge: 2023-05-19 | Disposition: A | Discharge status: Home / Self Care | Payer: 59 | Source: Ambulatory Visit | Attending: Family Medicine | Admitting: Family Medicine

## 2023-05-19 DIAGNOSIS — Z1231 Encounter for screening mammogram for malignant neoplasm of breast: Secondary | ICD-10-CM | POA: Insufficient documentation

## 2023-05-20 ENCOUNTER — Encounter: Payer: Self-pay | Admitting: *Deleted

## 2023-05-20 ENCOUNTER — Other Ambulatory Visit: Payer: Self-pay

## 2023-05-20 ENCOUNTER — Emergency Department
Admission: EM | Admit: 2023-05-20 | Discharge: 2023-05-21 | Disposition: A | Discharge status: Home / Self Care | Payer: 59 | Attending: Emergency Medicine | Admitting: Emergency Medicine

## 2023-05-20 DIAGNOSIS — R112 Nausea with vomiting, unspecified: Secondary | ICD-10-CM | POA: Insufficient documentation

## 2023-05-20 DIAGNOSIS — R197 Diarrhea, unspecified: Secondary | ICD-10-CM | POA: Diagnosis not present

## 2023-05-20 DIAGNOSIS — Z87891 Personal history of nicotine dependence: Secondary | ICD-10-CM | POA: Insufficient documentation

## 2023-05-20 DIAGNOSIS — E871 Hypo-osmolality and hyponatremia: Secondary | ICD-10-CM | POA: Diagnosis not present

## 2023-05-20 LAB — CBC
HCT: 37.8 % (ref 36.0–46.0)
Hemoglobin: 13.5 g/dL (ref 12.0–15.0)
MCH: 33.8 pg (ref 26.0–34.0)
MCHC: 35.7 g/dL (ref 30.0–36.0)
MCV: 94.5 fL (ref 80.0–100.0)
Platelets: 343 10*3/uL (ref 150–400)
RBC: 4 MIL/uL (ref 3.87–5.11)
RDW: 13.2 % (ref 11.5–15.5)
WBC: 9.3 10*3/uL (ref 4.0–10.5)
nRBC: 0 % (ref 0.0–0.2)

## 2023-05-20 LAB — URINALYSIS, ROUTINE W REFLEX MICROSCOPIC
Bacteria, UA: NONE SEEN
Bilirubin Urine: NEGATIVE
Glucose, UA: NEGATIVE mg/dL
Ketones, ur: 5 mg/dL — AB
Nitrite: NEGATIVE
Protein, ur: NEGATIVE mg/dL
Specific Gravity, Urine: 1.004 — ABNORMAL LOW (ref 1.005–1.030)
pH: 6 (ref 5.0–8.0)

## 2023-05-20 LAB — COMPREHENSIVE METABOLIC PANEL
ALT: 27 U/L (ref 0–44)
AST: 45 U/L — ABNORMAL HIGH (ref 15–41)
Albumin: 5 g/dL (ref 3.5–5.0)
Alkaline Phosphatase: 67 U/L (ref 38–126)
Anion gap: 19 — ABNORMAL HIGH (ref 5–15)
BUN: 6 mg/dL (ref 6–20)
CO2: 23 mmol/L (ref 22–32)
Calcium: 9.9 mg/dL (ref 8.9–10.3)
Chloride: 86 mmol/L — ABNORMAL LOW (ref 98–111)
Creatinine, Ser: 0.66 mg/dL (ref 0.44–1.00)
GFR, Estimated: >60 mL/min (ref >60)
Glucose, Bld: 113 mg/dL — ABNORMAL HIGH (ref 70–99)
Potassium: 3.7 mmol/L (ref 3.5–5.1)
Sodium: 128 mmol/L — ABNORMAL LOW (ref 135–145)
Total Bilirubin: 1.5 mg/dL — ABNORMAL HIGH (ref 0.0–1.2)
Total Protein: 7.9 g/dL (ref 6.5–8.1)

## 2023-05-20 LAB — LIPASE, BLOOD: Lipase: 36 U/L (ref 11–51)

## 2023-05-20 MED ORDER — SODIUM CHLORIDE 0.9 % IV BOLUS
1000.0000 mL | Freq: Once | INTRAVENOUS | Status: AC
  Administered 2023-05-20: 1000 mL via INTRAVENOUS

## 2023-05-20 MED ORDER — ONDANSETRON HCL 4 MG/2ML IJ SOLN
4.0000 mg | Freq: Once | INTRAMUSCULAR | Status: AC
  Administered 2023-05-20: 4 mg via INTRAVENOUS
  Filled 2023-05-20: qty 2

## 2023-05-20 NOTE — ED Provider Notes (Signed)
.-----------------------------------------   11:45 PM on 05/20/2023 -----------------------------------------  Blood pressure (!) 147/78, pulse 74, temperature 98 F (36.7 C), temperature source Oral, resp. rate 17, height 5\' 2"  (1.575 m), weight 44 kg, SpO2 100%.  In short, ENGLISH TOMER is a 53 y.o. female with a chief complaint of Emesis .  Refer to the original H&P for additional details.  The current plan of care is to follow-up respiratory viral panel, vitamin B12, reassessment after fluids, likely discharge..  Independent review of labs, sodium was 128, rest of electrolytes not severely deranged, respiratory panel is negative, no leukocytosis, lipase is normal.  Patient was able to give a stool sample here.  UA is not consistent with UTI.  On reassessment patient is feeling significantly better, will give her prescription for Zofran outpatient, considered but no indication for inpatient admission at this time, she safe for outpatient management.  Will discharge.  Instructed her to follow-up with primary care doctor for further management of her symptoms.  Strict and precautions given.  Clinical Course as of 05/21/23 0211  Thu May 20, 2023  2230 Comprehensive metabolic panel(!) Hyponatremia of 128, anion gap elevated 19 [AE]  2239 Urinalysis, Routine w reflex microscopic -Urine, Clean Catch(!) Hemoglobin positive, leukocytes positive [AE]  2239 CBC Within normal limits [AE]  Fri May 21, 2023  0048 Respiratory viral panel is negative. [TT]  0209 On reassessment patient is feeling significantly better, abdomen soft nontender.  Discussed findings with patient, UA is not consistent with UTI.  Will discharge with Zofran. [TT]    Clinical Course User Index [AE] Gladys Damme, PA-C [TT] Jodie Echevaria Franchot Erichsen, MD      Claybon Jabs, MD 05/21/23 [id number]

## 2023-05-20 NOTE — ED Provider Notes (Signed)
[facility] Provider Note    Event Date/Time   First MD Initiated Contact with Patient 05/20/23 2135     (approximate)   History   Emesis   HPI  Sonya Harmon is a 53 y.o. female who presents today with history of nauseous, vomit, diarrhea.  Patient was seen by her PCP who prescribed Zofran for symptoms.  Patient finished the treatment and continue with the vomit.  PCP referred here to ED. Patient has history of smoking with chronic cough, patient with history of deficiency and B12.  Patient states feeling cramps in abdomen and legs.     Physical Exam   Triage Vital Signs: ED Triage Vitals  Encounter Vitals Group     BP 05/20/23 1606 (!) 195/106     Systolic BP Percentile --      Diastolic BP Percentile --      Pulse Rate 05/20/23 1606 84     Resp 05/20/23 1606 18     Temp 05/20/23 1606 98.5 F (36.9 C)     Temp Source 05/20/23 1606 Oral     SpO2 05/20/23 1606 100 %     Weight 05/20/23 1605 97 lb (44 kg)     Height 05/20/23 1605 5\' 2"  (1.575 m)     Head Circumference --      Peak Flow --      Pain Score 05/20/23 1605 8     Pain Loc --      Pain Education --      Exclude from Growth Chart --     Most recent vital signs: Vitals:   05/20/23 1606 05/20/23 2144  BP: (!) 195/106 (!) 147/78  Pulse: 84 74  Resp: 18 17  Temp: 98.5 F (36.9 C) 98 F (36.7 C)  SpO2: 100% 100%     Constitutional: Alert , mild distress, dehydrated Eyes: Conjunctivae are normal.  Head: Atraumatic. Nose: No congestion/rhinnorhea. Mouth/Throat: Mucous membranes are moist.   Neck: Painless ROM.  Cardiovascular:   Good peripheral circulation.  Regular rhythm Respiratory: Normal respiratory effort.  No retractions.  Rales left lung Gastrointestinal: Skin without scars, bowel sounds positive soft tender to the deep palpation at the level of the false ribs. Musculoskeletal:  no deformity Neurologic:  MAE spontaneously. No gross focal neurologic deficits are  appreciated.  Skin:  Skin is warm, dry and intact. No rash noted. Psychiatric: Mood and affect are normal. Speech and behavior are normal.    ED Results / Procedures / Treatments   Labs (all labs ordered are listed, but only abnormal results are displayed) Labs Reviewed  COMPREHENSIVE METABOLIC PANEL - Abnormal; Notable for the following components:      Result Value   Sodium 128 (*)    Chloride 86 (*)    Glucose, Bld 113 (*)    AST 45 (*)    Total Bilirubin 1.5 (*)    Anion gap 19 (*)    All other components within normal limits  URINALYSIS, ROUTINE W REFLEX MICROSCOPIC - Abnormal; Notable for the following components:   Color, Urine YELLOW (*)    APPearance CLEAR (*)    Specific Gravity, Urine 1.004 (*)    Hgb urine dipstick SMALL (*)    Ketones, ur 5 (*)    Leukocytes,Ua SMALL (*)    All other components within normal limits  RESP PANEL BY RT-PCR (RSV, FLU A&B, COVID)  RVPGX2  C DIFFICILE QUICK SCREEN W PCR REFLEX    LIPASE, BLOOD  CBC  VITAMIN B12     EKG     RADIOLOGY I independently reviewed and interpreted imaging and agree with radiologists findings.      PROCEDURES:  Critical Care performed:   Procedures   MEDICATIONS ORDERED IN ED: Medications  sodium chloride 0.9 % bolus 1,000 mL (1,000 mLs Intravenous New Bag/Given 05/20/23 2327)  ondansetron (ZOFRAN) injection 4 mg (4 mg Intravenous Given 05/20/23 2327)     IMPRESSION / MDM / ASSESSMENT AND PLAN / ED COURSE  I reviewed the triage vital signs and the nursing notes.  Differential diagnosis includes, but is not limited to, influenza, gastroenteritis, C. difficile, pneumonia  Patient's presentation is most consistent with acute complicated illness / injury requiring diagnostic workup.  During admission patient received IV fluids and Zofran.  Labs pending.  Transferred care to Dr. Jodie Echevaria at 11:45 Clinical Course as of 05/20/23 2346  Thu May 20, 2023  2230 Comprehensive metabolic  panel(!) Hyponatremia of 128, anion gap elevated 19 [AE]  2239 Urinalysis, Routine w reflex microscopic -Urine, Clean Catch(!) Hemoglobin positive, leukocytes positive [AE]  2239 CBC Within normal limits [AE]    Clinical Course User Index [AE] Gladys Damme, PA-C     FINAL CLINICAL IMPRESSION(S) / ED DIAGNOSES   Final diagnoses:  None     Rx / DC Orders   ED Discharge Orders     None        Note:  This document was prepared using Dragon voice recognition software and may include unintentional dictation errors.   Gladys Damme, PA-C 05/20/23 2346    Minna Antis, MD 05/21/23 2237

## 2023-05-20 NOTE — ED Triage Notes (Signed)
Pt ambulatory to triage.  Pt reports n/v/d for 3 weeks.  Pt out of zofran for 1 week.  Pt has abd pain.   Pt reports decreased appetite. Pt alert  speech clear.

## 2023-05-21 ENCOUNTER — Encounter: Payer: Self-pay | Admitting: Family Medicine

## 2023-05-21 LAB — VITAMIN B12: Vitamin B-12: 489 pg/mL (ref 180–914)

## 2023-05-21 LAB — RESP PANEL BY RT-PCR (RSV, FLU A&B, COVID)  RVPGX2
Influenza A by PCR: NEGATIVE
Influenza B by PCR: NEGATIVE
Resp Syncytial Virus by PCR: NEGATIVE
SARS Coronavirus 2 by RT PCR: NEGATIVE

## 2023-05-21 MED ORDER — ONDANSETRON 4 MG PO TBDP: 4.0000 mg | ORAL_TABLET | Freq: Three times a day (TID) | ORAL | 0 refills | Status: DC | PRN

## 2023-05-25 ENCOUNTER — Ambulatory Visit
Admission: RE | Admit: 2023-05-25 | Discharge: 2023-05-25 | Disposition: A | Discharge status: Home / Self Care | Payer: 59 | Source: Ambulatory Visit | Attending: Family Medicine | Admitting: Family Medicine

## 2023-05-25 ENCOUNTER — Other Ambulatory Visit: Payer: Self-pay | Admitting: Family Medicine

## 2023-05-25 DIAGNOSIS — R110 Nausea: Secondary | ICD-10-CM | POA: Diagnosis not present

## 2023-05-25 DIAGNOSIS — E871 Hypo-osmolality and hyponatremia: Secondary | ICD-10-CM | POA: Diagnosis not present

## 2023-05-25 DIAGNOSIS — R928 Other abnormal and inconclusive findings on diagnostic imaging of breast: Secondary | ICD-10-CM

## 2023-05-25 DIAGNOSIS — F411 Generalized anxiety disorder: Secondary | ICD-10-CM | POA: Diagnosis not present

## 2023-05-25 DIAGNOSIS — Z87891 Personal history of nicotine dependence: Secondary | ICD-10-CM | POA: Diagnosis not present

## 2023-05-28 ENCOUNTER — Ambulatory Visit
Admission: RE | Admit: 2023-05-28 | Discharge: 2023-05-28 | Disposition: A | Discharge status: Home / Self Care | Payer: 59 | Source: Ambulatory Visit | Attending: Family Medicine | Admitting: Family Medicine

## 2023-05-28 DIAGNOSIS — R928 Other abnormal and inconclusive findings on diagnostic imaging of breast: Secondary | ICD-10-CM | POA: Diagnosis not present

## 2023-05-28 DIAGNOSIS — R92322 Mammographic fibroglandular density, left breast: Secondary | ICD-10-CM | POA: Diagnosis not present

## 2023-05-28 DIAGNOSIS — N6321 Unspecified lump in the left breast, upper outer quadrant: Secondary | ICD-10-CM | POA: Diagnosis not present

## 2023-05-31 ENCOUNTER — Encounter: Payer: Self-pay | Admitting: Family Medicine

## 2023-06-01 ENCOUNTER — Other Ambulatory Visit: Payer: Self-pay | Admitting: Family Medicine

## 2023-06-01 DIAGNOSIS — N63 Unspecified lump in unspecified breast: Secondary | ICD-10-CM

## 2023-06-01 DIAGNOSIS — R928 Other abnormal and inconclusive findings on diagnostic imaging of breast: Secondary | ICD-10-CM

## 2023-06-03 ENCOUNTER — Ambulatory Visit
Admission: RE | Admit: 2023-06-03 | Discharge: 2023-06-03 | Disposition: A | Discharge status: Home / Self Care | Payer: 59 | Source: Ambulatory Visit | Attending: Family Medicine | Admitting: Family Medicine

## 2023-06-03 DIAGNOSIS — N63 Unspecified lump in unspecified breast: Secondary | ICD-10-CM | POA: Diagnosis not present

## 2023-06-03 DIAGNOSIS — R928 Other abnormal and inconclusive findings on diagnostic imaging of breast: Secondary | ICD-10-CM

## 2023-06-03 DIAGNOSIS — D242 Benign neoplasm of left breast: Secondary | ICD-10-CM | POA: Insufficient documentation

## 2023-06-03 DIAGNOSIS — N6321 Unspecified lump in the left breast, upper outer quadrant: Secondary | ICD-10-CM | POA: Diagnosis not present

## 2023-06-03 HISTORY — PX: BREAST BIOPSY: SHX20

## 2023-06-03 MED ORDER — LIDOCAINE 1 % OPTIME INJ - NO CHARGE
2.0000 mL | Freq: Once | INTRAMUSCULAR | Status: AC
  Administered 2023-06-03: 2 mL
  Filled 2023-06-03: qty 2

## 2023-06-03 MED ORDER — LIDOCAINE-EPINEPHRINE 1 %-1:100000 IJ SOLN
8.0000 mL | Freq: Once | INTRAMUSCULAR | Status: AC
  Administered 2023-06-03: 8 mL
  Filled 2023-06-03: qty 8

## 2023-06-04 LAB — SURGICAL PATHOLOGY

## 2023-06-11 ENCOUNTER — Emergency Department

## 2023-06-11 ENCOUNTER — Inpatient Hospital Stay
Admission: EM | Admit: 2023-06-11 | Discharge: 2023-06-15 | DRG: 281 | Disposition: A | Discharge status: Home / Self Care | Attending: Internal Medicine | Admitting: Internal Medicine

## 2023-06-11 ENCOUNTER — Other Ambulatory Visit: Payer: Self-pay

## 2023-06-11 DIAGNOSIS — R112 Nausea with vomiting, unspecified: Secondary | ICD-10-CM | POA: Diagnosis not present

## 2023-06-11 DIAGNOSIS — I25118 Atherosclerotic heart disease of native coronary artery with other forms of angina pectoris: Secondary | ICD-10-CM | POA: Diagnosis not present

## 2023-06-11 DIAGNOSIS — Z79899 Other long term (current) drug therapy: Secondary | ICD-10-CM

## 2023-06-11 DIAGNOSIS — Z955 Presence of coronary angioplasty implant and graft: Secondary | ICD-10-CM

## 2023-06-11 DIAGNOSIS — Z823 Family history of stroke: Secondary | ICD-10-CM

## 2023-06-11 DIAGNOSIS — Z880 Allergy status to penicillin: Secondary | ICD-10-CM

## 2023-06-11 DIAGNOSIS — I701 Atherosclerosis of renal artery: Secondary | ICD-10-CM | POA: Diagnosis not present

## 2023-06-11 DIAGNOSIS — D72829 Elevated white blood cell count, unspecified: Secondary | ICD-10-CM | POA: Diagnosis present

## 2023-06-11 DIAGNOSIS — I214 Non-ST elevation (NSTEMI) myocardial infarction: Secondary | ICD-10-CM | POA: Diagnosis not present

## 2023-06-11 DIAGNOSIS — G621 Alcoholic polyneuropathy: Secondary | ICD-10-CM | POA: Diagnosis not present

## 2023-06-11 DIAGNOSIS — Z9851 Tubal ligation status: Secondary | ICD-10-CM

## 2023-06-11 DIAGNOSIS — I10 Essential (primary) hypertension: Secondary | ICD-10-CM | POA: Diagnosis not present

## 2023-06-11 DIAGNOSIS — E860 Dehydration: Secondary | ICD-10-CM | POA: Diagnosis present

## 2023-06-11 DIAGNOSIS — E871 Hypo-osmolality and hyponatremia: Secondary | ICD-10-CM | POA: Diagnosis not present

## 2023-06-11 DIAGNOSIS — Z886 Allergy status to analgesic agent status: Secondary | ICD-10-CM | POA: Diagnosis not present

## 2023-06-11 DIAGNOSIS — I2510 Atherosclerotic heart disease of native coronary artery without angina pectoris: Secondary | ICD-10-CM | POA: Diagnosis present

## 2023-06-11 DIAGNOSIS — Z7902 Long term (current) use of antithrombotics/antiplatelets: Secondary | ICD-10-CM | POA: Diagnosis not present

## 2023-06-11 DIAGNOSIS — F411 Generalized anxiety disorder: Secondary | ICD-10-CM | POA: Diagnosis not present

## 2023-06-11 DIAGNOSIS — E861 Hypovolemia: Secondary | ICD-10-CM | POA: Diagnosis present

## 2023-06-11 DIAGNOSIS — I700 Atherosclerosis of aorta: Secondary | ICD-10-CM | POA: Diagnosis not present

## 2023-06-11 DIAGNOSIS — Z85810 Personal history of malignant neoplasm of tongue: Secondary | ICD-10-CM

## 2023-06-11 DIAGNOSIS — E876 Hypokalemia: Secondary | ICD-10-CM | POA: Diagnosis present

## 2023-06-11 DIAGNOSIS — Z1331 Encounter for screening for depression: Secondary | ICD-10-CM | POA: Diagnosis not present

## 2023-06-11 DIAGNOSIS — E872 Acidosis, unspecified: Secondary | ICD-10-CM | POA: Diagnosis not present

## 2023-06-11 DIAGNOSIS — K209 Esophagitis, unspecified without bleeding: Secondary | ICD-10-CM | POA: Diagnosis present

## 2023-06-11 DIAGNOSIS — F17210 Nicotine dependence, cigarettes, uncomplicated: Secondary | ICD-10-CM | POA: Diagnosis present

## 2023-06-11 DIAGNOSIS — Z8711 Personal history of peptic ulcer disease: Secondary | ICD-10-CM

## 2023-06-11 DIAGNOSIS — T80818A Extravasation of other vesicant agent, initial encounter: Secondary | ICD-10-CM | POA: Diagnosis not present

## 2023-06-11 DIAGNOSIS — I739 Peripheral vascular disease, unspecified: Secondary | ICD-10-CM | POA: Diagnosis present

## 2023-06-11 DIAGNOSIS — F1010 Alcohol abuse, uncomplicated: Secondary | ICD-10-CM | POA: Diagnosis present

## 2023-06-11 DIAGNOSIS — R079 Chest pain, unspecified: Secondary | ICD-10-CM | POA: Diagnosis not present

## 2023-06-11 DIAGNOSIS — N179 Acute kidney failure, unspecified: Secondary | ICD-10-CM | POA: Diagnosis not present

## 2023-06-11 DIAGNOSIS — Z87891 Personal history of nicotine dependence: Secondary | ICD-10-CM | POA: Diagnosis not present

## 2023-06-11 DIAGNOSIS — R109 Unspecified abdominal pain: Secondary | ICD-10-CM | POA: Diagnosis not present

## 2023-06-11 DIAGNOSIS — M6282 Rhabdomyolysis: Secondary | ICD-10-CM | POA: Diagnosis not present

## 2023-06-11 DIAGNOSIS — I219 Acute myocardial infarction, unspecified: Secondary | ICD-10-CM

## 2023-06-11 DIAGNOSIS — E785 Hyperlipidemia, unspecified: Secondary | ICD-10-CM | POA: Diagnosis not present

## 2023-06-11 DIAGNOSIS — R9431 Abnormal electrocardiogram [ECG] [EKG]: Secondary | ICD-10-CM

## 2023-06-11 DIAGNOSIS — Z809 Family history of malignant neoplasm, unspecified: Secondary | ICD-10-CM

## 2023-06-11 DIAGNOSIS — F1090 Alcohol use, unspecified, uncomplicated: Secondary | ICD-10-CM | POA: Diagnosis not present

## 2023-06-11 DIAGNOSIS — J439 Emphysema, unspecified: Secondary | ICD-10-CM | POA: Diagnosis not present

## 2023-06-11 HISTORY — DX: Acute myocardial infarction, unspecified: I21.9

## 2023-06-11 LAB — HIV ANTIBODY (ROUTINE TESTING W REFLEX): HIV Screen 4th Generation wRfx: NONREACTIVE

## 2023-06-11 LAB — CBC
HCT: 51.9 % — ABNORMAL HIGH (ref 36.0–46.0)
Hemoglobin: 17.5 g/dL — ABNORMAL HIGH (ref 12.0–15.0)
MCH: 34.2 pg — ABNORMAL HIGH (ref 26.0–34.0)
MCHC: 33.7 g/dL (ref 30.0–36.0)
MCV: 101.6 fL — ABNORMAL HIGH (ref 80.0–100.0)
Platelets: 471 10*3/uL — ABNORMAL HIGH (ref 150–400)
RBC: 5.11 MIL/uL (ref 3.87–5.11)
RDW: 14.5 % (ref 11.5–15.5)
WBC: 22.6 10*3/uL — ABNORMAL HIGH (ref 4.0–10.5)
nRBC: 0.1 % (ref 0.0–0.2)

## 2023-06-11 LAB — APTT: aPTT: 26 s (ref 24–36)

## 2023-06-11 LAB — COMPREHENSIVE METABOLIC PANEL
ALT: 37 U/L (ref 0–44)
AST: 83 U/L — ABNORMAL HIGH (ref 15–41)
Albumin: 5.7 g/dL — ABNORMAL HIGH (ref 3.5–5.0)
Alkaline Phosphatase: 74 U/L (ref 38–126)
Anion gap: 32 — ABNORMAL HIGH (ref 5–15)
BUN: 10 mg/dL (ref 6–20)
CO2: 19 mmol/L — ABNORMAL LOW (ref 22–32)
Calcium: 10 mg/dL (ref 8.9–10.3)
Chloride: 80 mmol/L — ABNORMAL LOW (ref 98–111)
Creatinine, Ser: 1.39 mg/dL — ABNORMAL HIGH (ref 0.44–1.00)
GFR, Estimated: 46 mL/min — ABNORMAL LOW (ref >60)
Glucose, Bld: 157 mg/dL — ABNORMAL HIGH (ref 70–99)
Potassium: 2.7 mmol/L — CL (ref 3.5–5.1)
Sodium: 131 mmol/L — ABNORMAL LOW (ref 135–145)
Total Bilirubin: 2.6 mg/dL — ABNORMAL HIGH (ref 0.0–1.2)
Total Protein: 9.7 g/dL — ABNORMAL HIGH (ref 6.5–8.1)

## 2023-06-11 LAB — URINALYSIS, W/ REFLEX TO CULTURE (INFECTION SUSPECTED)
Bacteria, UA: NONE SEEN
Bilirubin Urine: NEGATIVE
Glucose, UA: NEGATIVE mg/dL
Ketones, ur: 80 mg/dL — AB
Leukocytes,Ua: NEGATIVE
Nitrite: NEGATIVE
Protein, ur: 100 mg/dL — AB
Specific Gravity, Urine: 1.026 (ref 1.005–1.030)
pH: 5 (ref 5.0–8.0)

## 2023-06-11 LAB — TROPONIN I (HIGH SENSITIVITY)
Troponin I (High Sensitivity): 2960 ng/L (ref <18)
Troponin I (High Sensitivity): 3286 ng/L (ref <18)
Troponin I (High Sensitivity): 3478 ng/L (ref <18)

## 2023-06-11 LAB — RESP PANEL BY RT-PCR (RSV, FLU A&B, COVID)  RVPGX2
Influenza A by PCR: NEGATIVE
Influenza B by PCR: NEGATIVE
Resp Syncytial Virus by PCR: NEGATIVE
SARS Coronavirus 2 by RT PCR: NEGATIVE

## 2023-06-11 LAB — PROTIME-INR
INR: 0.9 (ref 0.8–1.2)
Prothrombin Time: 12.6 s (ref 11.4–15.2)

## 2023-06-11 LAB — CK: Total CK: 551 U/L — ABNORMAL HIGH (ref 38–234)

## 2023-06-11 LAB — POTASSIUM: Potassium: 3 mmol/L — ABNORMAL LOW (ref 3.5–5.1)

## 2023-06-11 LAB — MAGNESIUM: Magnesium: 2.1 mg/dL (ref 1.7–2.4)

## 2023-06-11 LAB — HEPARIN LEVEL (UNFRACTIONATED): Heparin Unfractionated: 0.16 [IU]/mL — ABNORMAL LOW (ref 0.30–0.70)

## 2023-06-11 LAB — LACTIC ACID, PLASMA
Lactic Acid, Venous: 1.7 mmol/L (ref 0.5–1.9)
Lactic Acid, Venous: 2.3 mmol/L (ref 0.5–1.9)

## 2023-06-11 LAB — LIPASE, BLOOD: Lipase: 23 U/L (ref 11–51)

## 2023-06-11 MED ORDER — LACTATED RINGERS IV BOLUS
1000.0000 mL | Freq: Once | INTRAVENOUS | Status: AC
  Administered 2023-06-11: 1000 mL via INTRAVENOUS

## 2023-06-11 MED ORDER — ATORVASTATIN CALCIUM 80 MG PO TABS
80.0000 mg | ORAL_TABLET | Freq: Every day | ORAL | Status: DC
  Administered 2023-06-11: 80 mg via ORAL
  Filled 2023-06-11: qty 1

## 2023-06-11 MED ORDER — HEPARIN BOLUS VIA INFUSION
1200.0000 [IU] | Freq: Once | INTRAVENOUS | Status: AC
  Administered 2023-06-11: 1200 [IU] via INTRAVENOUS
  Filled 2023-06-11: qty 1200

## 2023-06-11 MED ORDER — VARENICLINE TARTRATE 0.5 MG PO TABS
1.0000 mg | ORAL_TABLET | Freq: Two times a day (BID) | ORAL | Status: DC
  Administered 2023-06-11 – 2023-06-14 (×6): 1 mg via ORAL
  Filled 2023-06-11 (×9): qty 2

## 2023-06-11 MED ORDER — GABAPENTIN 300 MG PO CAPS
300.0000 mg | ORAL_CAPSULE | Freq: Three times a day (TID) | ORAL | Status: DC
  Administered 2023-06-11 – 2023-06-15 (×12): 300 mg via ORAL
  Filled 2023-06-11 (×12): qty 1

## 2023-06-11 MED ORDER — BUPROPION HCL ER (SR) 150 MG PO TB12
150.0000 mg | ORAL_TABLET | Freq: Two times a day (BID) | ORAL | Status: DC
  Administered 2023-06-11 – 2023-06-15 (×8): 150 mg via ORAL
  Filled 2023-06-11 (×8): qty 1

## 2023-06-11 MED ORDER — METOCLOPRAMIDE HCL 5 MG/ML IJ SOLN
5.0000 mg | Freq: Once | INTRAMUSCULAR | Status: AC
  Administered 2023-06-11: 5 mg via INTRAVENOUS

## 2023-06-11 MED ORDER — LIDOCAINE VISCOUS HCL 2 % MT SOLN
15.0000 mL | Freq: Once | OROMUCOSAL | Status: AC
  Administered 2023-06-11: 15 mL via OROMUCOSAL
  Filled 2023-06-11: qty 15

## 2023-06-11 MED ORDER — ALUM & MAG HYDROXIDE-SIMETH 200-200-20 MG/5ML PO SUSP
30.0000 mL | Freq: Once | ORAL | Status: AC
  Administered 2023-06-11: 30 mL via ORAL
  Filled 2023-06-11: qty 30

## 2023-06-11 MED ORDER — ESCITALOPRAM OXALATE 10 MG PO TABS
10.0000 mg | ORAL_TABLET | Freq: Every day | ORAL | Status: DC
  Administered 2023-06-11 – 2023-06-15 (×5): 10 mg via ORAL
  Filled 2023-06-11 (×5): qty 1

## 2023-06-11 MED ORDER — IOHEXOL 350 MG/ML SOLN
75.0000 mL | Freq: Once | INTRAVENOUS | Status: AC | PRN
  Administered 2023-06-11: 75 mL via INTRAVENOUS

## 2023-06-11 MED ORDER — POTASSIUM CHLORIDE 20 MEQ PO PACK
40.0000 meq | PACK | Freq: Once | ORAL | Status: AC
  Administered 2023-06-11: 40 meq via ORAL
  Filled 2023-06-11: qty 2

## 2023-06-11 MED ORDER — LORAZEPAM 2 MG/ML IJ SOLN
0.5000 mg | Freq: Once | INTRAMUSCULAR | Status: AC
  Administered 2023-06-11: 0.5 mg via INTRAVENOUS
  Filled 2023-06-11: qty 1

## 2023-06-11 MED ORDER — PANTOPRAZOLE SODIUM 40 MG IV SOLR
40.0000 mg | Freq: Two times a day (BID) | INTRAVENOUS | Status: DC
  Administered 2023-06-11 – 2023-06-14 (×7): 40 mg via INTRAVENOUS
  Filled 2023-06-11 (×7): qty 10

## 2023-06-11 MED ORDER — METOCLOPRAMIDE HCL 5 MG/ML IJ SOLN
5.0000 mg | Freq: Four times a day (QID) | INTRAMUSCULAR | Status: DC | PRN
  Administered 2023-06-11 (×2): 5 mg via INTRAVENOUS
  Filled 2023-06-11 (×2): qty 2

## 2023-06-11 MED ORDER — MORPHINE SULFATE (PF) 4 MG/ML IV SOLN
4.0000 mg | Freq: Once | INTRAVENOUS | Status: AC
  Administered 2023-06-11: 4 mg via INTRAVENOUS
  Filled 2023-06-11: qty 1

## 2023-06-11 MED ORDER — PANTOPRAZOLE SODIUM 40 MG IV SOLR
80.0000 mg | Freq: Once | INTRAVENOUS | Status: AC
  Administered 2023-06-11: 80 mg via INTRAVENOUS
  Filled 2023-06-11: qty 20

## 2023-06-11 MED ORDER — CLOPIDOGREL BISULFATE 75 MG PO TABS
75.0000 mg | ORAL_TABLET | Freq: Every day | ORAL | Status: DC
  Administered 2023-06-11 – 2023-06-15 (×5): 75 mg via ORAL
  Filled 2023-06-11 (×5): qty 1

## 2023-06-11 MED ORDER — METOCLOPRAMIDE HCL 5 MG/ML IJ SOLN
10.0000 mg | Freq: Once | INTRAMUSCULAR | Status: DC
  Filled 2023-06-11: qty 2

## 2023-06-11 MED ORDER — POTASSIUM CHLORIDE CRYS ER 20 MEQ PO TBCR
40.0000 meq | EXTENDED_RELEASE_TABLET | Freq: Once | ORAL | Status: AC
  Administered 2023-06-11: 40 meq via ORAL
  Filled 2023-06-11: qty 2

## 2023-06-11 MED ORDER — PNEUMOCOCCAL 20-VAL CONJ VACC 0.5 ML IM SUSY
0.5000 mL | PREFILLED_SYRINGE | INTRAMUSCULAR | Status: DC
  Filled 2023-06-11: qty 0.5

## 2023-06-11 MED ORDER — HEPARIN BOLUS VIA INFUSION
2500.0000 [IU] | Freq: Once | INTRAVENOUS | Status: AC
  Administered 2023-06-11: 2500 [IU] via INTRAVENOUS
  Filled 2023-06-11: qty 2500

## 2023-06-11 MED ORDER — SODIUM CHLORIDE 0.9 % IV BOLUS
1000.0000 mL | Freq: Once | INTRAVENOUS | Status: AC
  Administered 2023-06-11: 1000 mL via INTRAVENOUS

## 2023-06-11 MED ORDER — MAGNESIUM SULFATE IN D5W 1-5 GM/100ML-% IV SOLN
1.0000 g | Freq: Once | INTRAVENOUS | Status: AC
Start: 2023-06-11 — End: 2023-06-11
  Administered 2023-06-11: 1 g via INTRAVENOUS
  Filled 2023-06-11: qty 100

## 2023-06-11 MED ORDER — SODIUM CHLORIDE 0.9 % IV SOLN: INTRAVENOUS | Status: AC

## 2023-06-11 MED ORDER — METOPROLOL TARTRATE 25 MG PO TABS
25.0000 mg | ORAL_TABLET | Freq: Two times a day (BID) | ORAL | Status: DC
  Administered 2023-06-11 – 2023-06-15 (×8): 25 mg via ORAL
  Filled 2023-06-11 (×8): qty 1

## 2023-06-11 MED ORDER — HEPARIN (PORCINE) 25000 UT/250ML-% IV SOLN
900.0000 [IU]/h | INTRAVENOUS | Status: DC
  Administered 2023-06-11: 500 [IU]/h via INTRAVENOUS
  Administered 2023-06-12 – 2023-06-14 (×2): 900 [IU]/h via INTRAVENOUS
  Filled 2023-06-11 (×3): qty 250

## 2023-06-11 MED ORDER — SUCRALFATE 1 GM/10ML PO SUSP
1.0000 g | Freq: Three times a day (TID) | ORAL | Status: DC
  Administered 2023-06-11 – 2023-06-15 (×15): 1 g via ORAL
  Filled 2023-06-11 (×18): qty 10

## 2023-06-11 NOTE — ED Triage Notes (Signed)
 Pt to ED for chest pain that started after emesis yesterday. Also reports bilateral leg pain from muscle spasms.

## 2023-06-11 NOTE — Consult Note (Signed)
 Pharmacy Consult Note - Anticoagulation  Pharmacy Consult for heparin Indication: chest pain/ACS  PATIENT MEASUREMENTS: Height: 5\' 2"  (157.5 cm) Weight: 43.1 kg (95 lb) IBW/kg (Calculated) : 50.1 HEPARIN DW (KG): 43.1  VITAL SIGNS: Temp: 97.6 F (36.4 C) (03/07 0735) Temp Source: Oral (03/07 0735) BP: 147/92 (03/07 0830) Pulse Rate: 109 (03/07 0830)  Recent Labs    06/11/23 0813 06/11/23 0853  HGB 17.5*  --   HCT 51.9*  --   PLT 471*  --   APTT 26  --   LABPROT 12.6  --   INR 0.9  --   CREATININE  --  1.39*  CKTOTAL 551*  --   TROPONINIHS 2,960* 3,286*    Estimated Creatinine Clearance: 32.9 mL/min (A) (by C-G formula based on SCr of 1.39 mg/dL (H)).  PAST MEDICAL HISTORY: Past Medical History:  Diagnosis Date   Alcohol abuse    Coronary artery disease 09/2021   s/p PCI to RCA in setting of unstable angina   History of tongue cancer    PAD (peripheral artery disease) (HCC)    Tobacco abuse     ASSESSMENT: 53 y.o. female with PMH including unstable angina, CAD s/p PCI (09/2021), HTN, tobacco use, CVA, superior mesenteric artery stenosis, PAD, PUD is presenting with chest pain and concerns for ACS. Chest pain started after episode of vomiting yesterday. Cardiac troponins trending up, currently at 3,286. Patient is not on chronic anticoagulation per chart review. Pharmacy has been consulted to initiate and manage heparin intravenous infusion.  Pertinent medications: Not on chronic anticoagulation prior to admission per chart review   Goal(s) of therapy: Heparin level 0.3 - 0.7 units/mL Monitor platelets by anticoagulation protocol: Yes   Baseline anticoagulation labs: Recent Labs    06/11/23 0813  APTT 26  INR 0.9  HGB 17.5*  PLT 471*    Date Time aPTT/HL Rate/Comment     PLAN: Give 2500 units bolus x1; then start heparin infusion at 500 units/hour. Check heparin level in 6 hours, then daily once at least two levels are consecutively  therapeutic. Monitor CBC daily while on heparin infusion.  Will M. Dareen Piano, PharmD Clinical Pharmacist 06/11/2023 11:24 AM

## 2023-06-11 NOTE — H&P (Addendum)
 History and Physical    Sonya Harmon:[id number] DOB: 28-Mar-1971 DOA: 06/11/2023  PCP: Sonya Ravel, MD (Confirm with patient/family/NH records and if not entered, this has to be entered at [facility] point of entry) Patient coming from: Home  I have personally briefly reviewed patient's old medical records in [facility]  Chief Complaint: Nausea vomiting, abdominal pain  HPI: Sonya Harmon is a 53 y.o. female with medical history significant of CAD status post RCA stenting, HTN, PAD status post intervention, presented with worsening of epigastric pain, chest pain and persistent nausea with vomiting.  Patient started to have intermittent epigastric pain about 2 to 3 months ago, intermittent, not associated with meals takings and no significant nocturnal pain.  Recently patient started develop nausea associated with meals eating and she estimated that she had lost about 10 pounds in last 2 months.  She tried as needed Zofran with some relief.  2 days ago, nauseous vomiting became constant, unable to obtain any food or fluids down she became very dehydrated.  Vomitus content has been stomach content clear liquid none bile nonbloody none coffee-ground.  Last night patient started to have sharp and burning chest pains, retrosternal nonradiating constant.  ED Course: Afebrile, tachycardia heart rate 100-, blood pressure 120/90, O2 saturation 98% on room air.  Blood work showed K2.7, sodium 131, WBC 22, hemoglobin 17.5, creatinine 1.3, BUN 10.  Bilirubin 2.6, troponin 2900> 3200, CK5 51.  EKG showed no acute ST changes.  Patient was started on heparin drip and PPI x 1 in the ED  Review of Systems: As per HPI otherwise 14 point review of systems negative.    Past Medical History:  Diagnosis Date   Alcohol abuse    Coronary artery disease 09/2021   s/p PCI to RCA in setting of unstable angina   History of tongue cancer    PAD (peripheral artery disease) (HCC)    Tobacco abuse     Past  Surgical History:  Procedure Laterality Date   ABDOMINAL AORTOGRAM W/LOWER EXTREMITY N/A 10/15/2021   Procedure: ABDOMINAL AORTOGRAM W/LOWER EXTREMITY;  Surgeon: Iran Ouch, MD;  Location: [facility];  Service: Cardiovascular;  Laterality: N/A;   ABDOMINAL AORTOGRAM W/LOWER EXTREMITY N/A 12/30/2022   Procedure: ABDOMINAL AORTOGRAM W/LOWER EXTREMITY;  Surgeon: Iran Ouch, MD;  Location: [facility];  Service: Cardiovascular;  Laterality: N/A;   BILATERAL SALPINGECTOMY     BREAST BIOPSY Left 06/03/2023   [place] Bx, Heart Clip, Path pending   BREAST BIOPSY Left 06/03/2023   [place] LT BREAST BX W LOC DEV 1ST LESION IMG BX SPEC US GUIDE 06/03/2023 [facility]   COLONOSCOPY WITH PROPOFOL N/A 03/11/2022   Procedure: COLONOSCOPY WITH PROPOFOL;  Surgeon: Toney Reil, MD;  Location: [facility] ENDOSCOPY;  Service: Gastroenterology;  Laterality: N/A;   COLONOSCOPY WITH PROPOFOL N/A 03/18/2023   Procedure: COLONOSCOPY WITH PROPOFOL;  Surgeon: Toney Reil, MD;  Location: [facility] ENDOSCOPY;  Service: Gastroenterology;  Laterality: N/A;   CORONARY BALLOON ANGIOPLASTY N/A 09/08/2021   Procedure: CORONARY BALLOON ANGIOPLASTY - ABORTED;  Surgeon: Iran Ouch, MD;  Location: [facility] INVASIVE CV LAB;  Service: Cardiovascular;  Laterality: N/A;   CORONARY STENT INTERVENTION N/A 09/10/2021   Procedure: CORONARY STENT INTERVENTION;  Surgeon: Iran Ouch, MD;  Location: [facility] INVASIVE CV LAB;  Service: Cardiovascular;  Laterality: N/A;   ESOPHAGOGASTRODUODENOSCOPY (EGD) WITH PROPOFOL N/A 11/01/2021   Procedure: ESOPHAGOGASTRODUODENOSCOPY (EGD) WITH PROPOFOL;  Surgeon: Toney Reil, MD;  Location:  [facility] ENDOSCOPY;  Service: Gastroenterology;  Laterality: N/A;   ESOPHAGOGASTRODUODENOSCOPY (EGD) WITH PROPOFOL N/A 03/11/2022   Procedure: ESOPHAGOGASTRODUODENOSCOPY (EGD) WITH PROPOFOL;  Surgeon: Toney Reil, MD;  Location: [facility] ENDOSCOPY;  Service: Gastroenterology;   Laterality: N/A;   LEFT HEART CATH AND CORONARY ANGIOGRAPHY N/A 09/08/2021   Procedure: LEFT HEART CATH AND CORONARY ANGIOGRAPHY;  Surgeon: Laurier Nancy, MD;  Location: [facility] INVASIVE CV LAB;  Service: Cardiovascular;  Laterality: N/A;   PERIPHERAL VASCULAR INTERVENTION  10/15/2021   Procedure: PERIPHERAL VASCULAR INTERVENTION;  Surgeon: Iran Ouch, MD;  Location: [facility];  Service: Cardiovascular;;   POLYPECTOMY  03/18/2023   Procedure: POLYPECTOMY;  Surgeon: Toney Reil, MD;  Location: [facility] ENDOSCOPY;  Service: Gastroenterology;;   TONGUE SURGERY       reports that she has been smoking cigarettes. She started smoking about 36 years ago. She has a 17.5 pack-year smoking history. She has quit using smokeless tobacco. She reports current alcohol use of about 42.0 standard drinks of alcohol per week. She reports that she does not currently use drugs after having used the following drugs: Marijuana.  Allergies  Allergen Reactions   Aspirin Other (See Comments)    Bleeding ulcers   Amoxicill-Clarithro-Lansopraz Nausea And Vomiting   Amoxicillin Rash    Family History  Problem Relation Age of Onset   Cancer Mother    Cancer Father    Stroke Sister    Aneurysm Sister    Breast cancer Neg Hx      Prior to Admission medications   Medication Sig Start Date End Date Taking? Authorizing Provider  escitalopram (LEXAPRO) 10 MG tablet Take 10 mg by mouth daily. 05/25/23  Yes [provider]  atorvastatin (LIPITOR) 80 MG tablet TAKE 1 TABLET BY MOUTH EVERY DAY 12/17/22   Hammock, Lavonna Rua, NP  buPROPion (WELLBUTRIN SR) 150 MG 12 hr tablet TAKE 1 TABLET BY MOUTH TWICE A DAY 02/04/23   Hammock, Lavonna Rua, NP  clopidogrel (PLAVIX) 75 MG tablet Take 1 tablet (75 mg total) by mouth daily. 08/13/22 08/13/23  Antonieta Iba, MD  gabapentin (NEURONTIN) 300 MG capsule Take 300 mg by mouth 3 (three) times daily.    [provider]  metoprolol tartrate (LOPRESSOR) 25 MG  tablet Take 1 tablet (25 mg total) by mouth 2 (two) times daily. 09/18/22 04/01/23  Charlsie Quest, NP  omeprazole (PRILOSEC) 40 MG capsule Take 1 capsule (40 mg total) by mouth daily. 08/13/22 08/08/23  Antonieta Iba, MD  ondansetron (ZOFRAN-ODT) 4 MG disintegrating tablet Take 1 tablet (4 mg total) by mouth every 8 (eight) hours as needed for nausea or vomiting. 05/21/23   Claybon Jabs, MD  varenicline (CHANTIX CONTINUING MONTH PAK) 1 MG tablet Take 1 tablet (1 mg total) by mouth 2 (two) times daily. 05/02/23   Iran Ouch, MD  Varenicline Tartrate, Starter, (CHANTIX STARTING MONTH PAK) 0.5 MG X 11 & 1 MG X 42 TBPK Take one 0.5 mg tablet by mouth once daily for 3 days, then increase to one 0.5 mg tablet twice daily for 4 days, then increase to one 1 mg tablet twice daily. 04/01/23   Iran Ouch, MD    Physical Exam: Vitals:   06/11/23 0733 06/11/23 0735 06/11/23 0830  BP:  (!) 126/96 (!) 147/92  Pulse:  (!) 127 (!) 109  Resp:  (!) 21 (!) 23  Temp:  97.6 F (36.4 C)   TempSrc:  Oral   SpO2:  98% 99%  Weight: 43.1 kg    Height: 5\' 2"  (1.575 m)      Constitutional: NAD, calm, comfortable Vitals:   06/11/23 0733 06/11/23 0735 06/11/23 0830  BP:  (!) 126/96 (!) 147/92  Pulse:  (!) 127 (!) 109  Resp:  (!) 21 (!) 23  Temp:  97.6 F (36.4 C)   TempSrc:  Oral   SpO2:  98% 99%  Weight: 43.1 kg    Height: 5\' 2"  (1.575 m)     Eyes: PERRL, lids and conjunctivae normal ENMT: Mucous membranes are dry. Posterior pharynx clear of any exudate or lesions.Normal dentition.  Neck: normal, supple, no masses, no thyromegaly Respiratory: clear to auscultation bilaterally, no wheezing, no crackles. Normal respiratory effort. No accessory muscle use.  Cardiovascular: Regular rate and rhythm, no murmurs / rubs / gallops. No extremity edema. 2+ pedal pulses. No carotid bruits.  Abdomen: no tenderness, no masses palpated. No hepatosplenomegaly. Bowel sounds positive.  Musculoskeletal: no  clubbing / cyanosis. No joint deformity upper and lower extremities. Good ROM, no contractures. Normal muscle tone.  Skin: no rashes, lesions, ulcers. No induration Neurologic: CN 2-12 grossly intact. Sensation intact, DTR normal. Strength 5/5 in all 4.  Psychiatric: Normal judgment and insight. Alert and oriented x 3. Normal mood.     Labs on Admission: I have personally reviewed following labs and imaging studies  CBC: Recent Labs  Lab 06/11/23 0813  WBC 22.6*  HGB 17.5*  HCT 51.9*  MCV 101.6*  PLT 471*   Basic Metabolic Panel: Recent Labs  Lab 06/11/23 0853  NA 131*  K 2.7*  CL 80*  CO2 19*  GLUCOSE 157*  BUN 10  CREATININE 1.39*  CALCIUM 10.0  MG 2.1   GFR: Estimated Creatinine Clearance: 32.9 mL/min (A) (by C-G formula based on SCr of 1.39 mg/dL (H)). Liver Function Tests: Recent Labs  Lab 06/11/23 0853  AST 83*  ALT 37  ALKPHOS 74  BILITOT 2.6*  PROT 9.7*  ALBUMIN 5.7*   Recent Labs  Lab 06/11/23 0813  LIPASE 23   No results for input(s): "AMMONIA" in the last 168 hours. Coagulation Profile: Recent Labs  Lab 06/11/23 0813  INR 0.9   Cardiac Enzymes: Recent Labs  Lab 06/11/23 0813  CKTOTAL 551*   BNP (last 3 results) No results for input(s): "PROBNP" in the last 8760 hours. HbA1C: No results for input(s): "HGBA1C" in the last 72 hours. CBG: No results for input(s): "GLUCAP" in the last 168 hours. Lipid Profile: No results for input(s): "CHOL", "HDL", "LDLCALC", "TRIG", "CHOLHDL", "LDLDIRECT" in the last 72 hours. Thyroid Function Tests: No results for input(s): "TSH", "T4TOTAL", "FREET4", "T3FREE", "THYROIDAB" in the last 72 hours. Anemia Panel: No results for input(s): "VITAMINB12", "FOLATE", "FERRITIN", "TIBC", "IRON", "RETICCTPCT" in the last 72 hours. Urine analysis:    Component Value Date/Time   COLORURINE YELLOW (A) 06/11/2023 1029   APPEARANCEUR HAZY (A) 06/11/2023 1029   LABSPEC 1.026 06/11/2023 1029   PHURINE 5.0  06/11/2023 1029   GLUCOSEU NEGATIVE 06/11/2023 1029   HGBUR MODERATE (A) 06/11/2023 1029   BILIRUBINUR NEGATIVE 06/11/2023 1029   KETONESUR 80 (A) 06/11/2023 1029   PROTEINUR 100 (A) 06/11/2023 1029   NITRITE NEGATIVE 06/11/2023 1029   LEUKOCYTESUR NEGATIVE 06/11/2023 1029    Radiological Exams on Admission: CT Angio Chest/Abd/Pel for Dissection W and/or W/WO Result Date: 06/11/2023 CLINICAL DATA:  Acute aortic syndrome suspected, chest pain, abdominal pain, nausea and vomiting EXAM: CT ANGIOGRAPHY CHEST, ABDOMEN AND PELVIS TECHNIQUE: Non-contrast CT of the  chest was initially obtained. Multidetector CT imaging through the chest, abdomen and pelvis was performed using the standard protocol during bolus administration of intravenous contrast. Multiplanar reconstructed images and MIPs were obtained and reviewed to evaluate the vascular anatomy. RADIATION DOSE REDUCTION: This exam was performed according to the departmental dose-optimization program which includes automated exposure control, adjustment of the mA and/or kV according to patient size and/or use of iterative reconstruction technique. CONTRAST:  75mL OMNIPAQUE IOHEXOL 350 MG/ML SOLN COMPARISON:  CT chest, 05/25/2023, CT abdomen pelvis, 02/21/2023 FINDINGS: CTA CHEST FINDINGS VASCULAR Aorta: Satisfactory opacification of the aorta. Normal caliber of the thoracic aorta. Severe mixed aortic atherosclerosis with a large burden of irregular mural thrombus, predominantly involving the descending thoracic aorta. No evidence of aneurysm, dissection, or other acute aortic pathology. Cardiovascular: No evidence of pulmonary embolism on limited non-tailored examination. Normal heart size. Left and right coronary artery calcifications and stents. No pericardial effusion. Review of the MIP images confirms the above findings. NON VASCULAR Mediastinum/Nodes: No enlarged mediastinal, hilar, or axillary lymph nodes. Severe circumferential wall thickening  involving the entirety of the esophagus (series 6, image 104). Thyroid and trachea demonstrate no significant findings. Lungs/Pleura: Minimal centrilobular and paraseptal emphysema. Background of fine centrilobular nodularity throughout the lungs. No pleural effusion or pneumothorax. Musculoskeletal: No chest wall abnormality. No acute osseous findings. Review of the MIP images confirms the above findings. CTA ABDOMEN AND PELVIS FINDINGS VASCULAR Normal contour and caliber of the abdominal aorta. No evidence of aneurysm, dissection, or other acute aortic pathology. Severe mixed calcific atherosclerosis. Vascular variant direct origin of the hepatic artery from the aorta with otherwise standard branching pattern of the abdominal aorta. Review of the MIP images confirms the above findings. NON-VASCULAR Hepatobiliary: No solid liver abnormality is seen. Hepatic steatosis no gallstones, gallbladder wall thickening, or biliary dilatation. Pancreas: Unremarkable. No pancreatic ductal dilatation or surrounding inflammatory changes. Spleen: Normal in size without significant abnormality. Adrenals/Urinary Tract: Adrenal glands are unremarkable. Kidneys are normal, without renal calculi, solid lesion, or hydronephrosis. Bladder is unremarkable. Stomach/Bowel: Stomach is within normal limits. Appendix appears normal. No evidence of bowel wall thickening, distention, or inflammatory changes. Lymphatic: No enlarged abdominal or pelvic lymph nodes. Reproductive: No mass or other significant abnormality. Other: No abdominal wall hernia or abnormality. No ascites. Musculoskeletal: No acute osseous findings. IMPRESSION: 1. Normal caliber of the thoracic and abdominal aorta. Severe mixed aortic atherosclerosis, significantly advanced for patient age. No evidence of aneurysm, dissection, or other acute aortic pathology. 2. Severe circumferential wall thickening involving the entirety of the esophagus, consistent with nonspecific  infectious or inflammatory esophagitis. 3. Minimal emphysema and smoking-related respiratory bronchiolitis. 4. Coronary artery disease. 5. Hepatic steatosis. Aortic Atherosclerosis (ICD10-I70.0) and Emphysema (ICD10-J43.9). Electronically Signed   By: Jearld Lesch M.D.   On: 06/11/2023 10:38   DG Chest 2 View Result Date: 06/11/2023 CLINICAL DATA:  Chest pain. EXAM: CHEST - 2 VIEW COMPARISON:  February 20, 2023. FINDINGS: The heart size and mediastinal contours are within normal limits. Both lungs are clear. The visualized skeletal structures are unremarkable. IMPRESSION: No active cardiopulmonary disease. Electronically Signed   By: Lupita Raider M.D.   On: 06/11/2023 10:28    EKG: Independently reviewed.  Sinus rhythm, no acute ST changes.  Assessment/Plan Principal Problem:   NSTEMI (non-ST elevated myocardial infarction) (HCC)  (please populate well all problems here in Problem List. (For example, if patient is on BP meds at home and you resume or decide to hold them, it is a  problem that needs to be her. Same for CAD, COPD, HLD and so on)  NSTEMI -Continue ACS medication including heparin drip -Continue Plavix -Echocardiogram  Intractable nauseous vomiting Epigastric pain -Suspect underlying peptic ulcer -CT chest abdomen pelvis also showed signs of acute esophagitis probably from repeated nausea vomiting and reflux. -Discussed with on-call GI Dr. Servando Snare, recommend conservative management PPI Carafate and possibly antifungal treatment if indicated.  GI recommended no EGD in 6 weeks given there is a heart attack.  Hyponatremia -Hypovolemic, secondary to repeated nauseous vomiting and GI loss -Correct volume status then reevaluate  Hypokalemia -P.o. replacement, recheck level tonight  Mild rhabdomyolysis -Mild elevation of CK but UA does show some level of myoglobin -Etiology likely secondary to repeated nauseous vomiting and possible esophageal muscular injury -IV fluid, recheck  CK level tomorrow  Severe dehydration Leukocytosis Hemoconcentrated -IV fluid -Monitor off antibiotics  DVT prophylaxis: Heparin drip Code Status: Full code Family Communication: Daughter at bedside Disposition Plan: Patient sick with NSTEMI requiring ACS treatment and inpatient cardiology management, expect more than 2 midnight hospital stay Consults called: Cardiology, curbside consult with GI Admission status: Telemetry admission   Emeline General MD [facility] Pager [phone]  06/11/2023, 1:07 PM

## 2023-06-11 NOTE — ED Provider Notes (Signed)
 [facility] Provider Note    Event Date/Time   First MD Initiated Contact with Patient 06/11/23 [phone]     (approximate)   History   Chest Pain and Emesis   HPI  Sonya Harmon is a 53 y.o. female past medical history significant for hypertension, hyperlipidemia, CAD who presents to the emergency department for nausea and vomiting and chest pain.  Patient states that she started at yesterday morning having multiple episodes of vomiting.  Difficulty keeping anything down since that time.  States that her throat and chest are hurting and feel raw after multiple episodes of vomiting.  Complaining of diffuse abdominal pain.  Denies any diarrhea.  Last bowel movement was couple of days ago.  Passing gas.  Denies fever or chills.  Does endorse muscle cramping in her legs.  No dysuria, urinary urgency or frequency.  Prior tubal ligation but no other prior abdominal surgery.  Denies any alcohol use, marijuana use, Goody's powder use NSAID use.  No prior bowel obstruction.  Denies blood in her emesis.  Trying Zofran at home without improvement of her symptoms.     Physical Exam   Triage Vital Signs: ED Triage Vitals  Encounter Vitals Group     BP 06/11/23 0735 (!) 126/96     Systolic BP Percentile --      Diastolic BP Percentile --      Pulse Rate 06/11/23 0735 (!) 127     Resp 06/11/23 0735 (!) 21     Temp --      Temp src --      SpO2 06/11/23 0735 98 %     Weight 06/11/23 0733 95 lb (43.1 kg)     Height 06/11/23 0733 5\' 2"  (1.575 m)     Head Circumference --      Peak Flow --      Pain Score 06/11/23 0733 10     Pain Loc --      Pain Education --      Exclude from Growth Chart --     Most recent vital signs: Vitals:   06/11/23 0735 06/11/23 0830  BP: (!) 126/96 (!) 147/92  Pulse: (!) 127 (!) 109  Resp: (!) 21 (!) 23  Temp: 97.6 F (36.4 C)   SpO2: 98% 99%    Physical Exam Constitutional:      General: She is in acute distress.     Appearance:  She is well-developed. She is ill-appearing.     Comments: Actively vomiting  HENT:     Head: Atraumatic.  Eyes:     Conjunctiva/sclera: Conjunctivae normal.  Cardiovascular:     Rate and Rhythm: Regular rhythm. Tachycardia present.  Pulmonary:     Effort: No respiratory distress.  Abdominal:     General: There is no distension.     Palpations: Abdomen is soft.     Tenderness: There is abdominal tenderness (Mild diffuse abdominal tenderness to palpation with no rebound or guarding.).  Musculoskeletal:        General: Normal range of motion.     Cervical back: Normal range of motion.     Right lower leg: No edema.     Left lower leg: No edema.     Comments: +2 DP pulses that are equal bilaterally  Skin:    General: Skin is warm.     Capillary Refill: Capillary refill takes less than 2 seconds.  Neurological:     Mental Status: She is alert. Mental  status is at baseline.     IMPRESSION / MDM / ASSESSMENT AND PLAN / ED COURSE  I reviewed the triage vital signs and the nursing notes.  Differential diagnosis including viral gastroenteritis, gastritis/PUD, bowel obstruction, COVID/influenza, ACS, anemia, electrolyte abnormality, dehydration.  No severe tearing chest pain have a low suspicion for dissection and pulses are equal and symmetric.  No hematemesis and no alcohol use have a low suspicion for pancreatitis or Boerhaave's.  EKG  I, Corena Herter, the attending physician, personally viewed and interpreted this ECG.  Sinus tachycardia with a heart rate of 128.  Normal intervals.  Nonspecific ST changes with ST depression to the inferior leads but no significant ST elevation.  Significant change when compared to prior EKG in November 2024  Repeat EKG overall unchanged with no STEMI criteria but continues to have diffuse ST depression, sinus tachycardia mildly improved and a QT interval of 277  Sinus tachycardia while on cardiac telemetry.  RADIOLOGY I independently reviewed  imaging, my interpretation of imaging: Chest x-ray -no widened mediastinum  CTA chest abdomen and pelvis obtained -no findings of dissection.  Noted coronary disease.  Findings of smoking-related emphysema.  Severe esophagitis.  LABS (all labs ordered are listed, but only abnormal results are displayed) Labs interpreted as -    Labs Reviewed  CBC - Abnormal; Notable for the following components:      Result Value   WBC 22.6 (*)    Hemoglobin 17.5 (*)    HCT 51.9 (*)    MCV 101.6 (*)    MCH 34.2 (*)    Platelets 471 (*)    All other components within normal limits  COMPREHENSIVE METABOLIC PANEL - Abnormal; Notable for the following components:   Sodium 131 (*)    Potassium 2.7 (*)    Chloride 80 (*)    CO2 19 (*)    Glucose, Bld 157 (*)    Creatinine, Ser 1.39 (*)    Total Protein 9.7 (*)    Albumin 5.7 (*)    AST 83 (*)    Total Bilirubin 2.6 (*)    GFR, Estimated 46 (*)    Anion gap 32 (*)    All other components within normal limits  CK - Abnormal; Notable for the following components:   Total CK 551 (*)    All other components within normal limits  URINALYSIS, W/ REFLEX TO CULTURE (INFECTION SUSPECTED) - Abnormal; Notable for the following components:   Color, Urine YELLOW (*)    APPearance HAZY (*)    Hgb urine dipstick MODERATE (*)    Ketones, ur 80 (*)    Protein, ur 100 (*)    All other components within normal limits  TROPONIN I (HIGH SENSITIVITY) - Abnormal; Notable for the following components:   Troponin I (High Sensitivity) 2,960 (*)    All other components within normal limits  TROPONIN I (HIGH SENSITIVITY) - Abnormal; Notable for the following components:   Troponin I (High Sensitivity) 3,286 (*)    All other components within normal limits  RESP PANEL BY RT-PCR (RSV, FLU A&B, COVID)  RVPGX2  MAGNESIUM  LIPASE, BLOOD  LACTIC ACID, PLASMA  LACTIC ACID, PLASMA  APTT  PROTIME-INR     MDM    Patient arrives actively vomiting.  Plan for  antiemetics, pain medication and IV fluids.  Will obtain lab work.  EKG with QT interval of 475.  Will give 5 mg of Reglan given significant emesis and IV fluids and repeat EKG.  Repeat EKG continues to have borderline prolonged QTc at 477.  Diffuse ST depression but does not meet STEMI criteria.  Findings concerning for diffuse ischemia.  Troponin significantly elevated near 3000.  Plan for CT will likely start on heparin given concern for NSTEMI.  CT scan without findings of dissection.  Troponin continued to be elevated in the 3000's.  No chest pain at this time.  Repeat EKG without STEMI criteria.  Patient was started on heparin bolus and infusion.  Hypokalemia and was given oral replacement, initially could not tolerate the pills so ordered a packet of powder for replacement.  Given IV magnesium and another 1 L of IV fluids.  CT scan with findings of severe esophagitis.  Given IV Protonix and a GI cocktail given her burning sensation to her throat.  Consulted hospitalist for admission for NSTEMI, hypokalemia and severe esophagitis   PROCEDURES:  Critical Care performed: yes  .Ultrasound ED Peripheral IV (Provider)  Date/Time: 06/11/2023 8:40 AM  Performed by: Corena Herter, MD Authorized by: Corena Herter, MD   Procedure details:    Indications: multiple failed IV attempts     Skin Prep: chlorhexidine gluconate     Location:  Left AC   Angiocath:  20 G   Bedside Ultrasound Guided: Yes     Images: not archived     Patient tolerated procedure without complications: Yes     Dressing applied: Yes   .Critical Care  Performed by: Corena Herter, MD Authorized by: Corena Herter, MD   Critical care provider statement:    Critical care time (minutes):  40   Critical care time was exclusive of:  Separately billable procedures and treating other patients   Critical care was necessary to treat or prevent imminent or life-threatening deterioration of the following conditions:  Cardiac  failure   Critical care was time spent personally by me on the following activities:  Development of treatment plan with patient or surrogate, discussions with consultants, evaluation of patient's response to treatment, examination of patient, ordering and review of laboratory studies, ordering and review of radiographic studies, ordering and performing treatments and interventions, pulse oximetry, re-evaluation of patient's condition and review of old charts   Care discussed with: admitting provider     Patient's presentation is most consistent with acute presentation with potential threat to life or bodily function.   MEDICATIONS ORDERED IN ED: Medications  magnesium sulfate IVPB 1 g 100 mL (1 g Intravenous New Bag/Given 06/11/23 1025)  LORazepam (ATIVAN) injection 0.5 mg (has no administration in time range)  potassium chloride (KLOR-CON) packet 40 mEq (has no administration in time range)  alum & mag hydroxide-simeth (MAALOX/MYLANTA) 200-200-20 MG/5ML suspension 30 mL (has no administration in time range)  lidocaine (XYLOCAINE) 2 % viscous mouth solution 15 mL (has no administration in time range)  pantoprazole (PROTONIX) injection 80 mg (has no administration in time range)  sodium chloride 0.9 % bolus 1,000 mL (0 mLs Intravenous Stopped 06/11/23 1029)  morphine (PF) 4 MG/ML injection 4 mg (4 mg Intravenous Given 06/11/23 0817)  metoCLOPramide (REGLAN) injection 5 mg (5 mg Intravenous Given 06/11/23 0814)  potassium chloride SA (KLOR-CON M) CR tablet 40 mEq (40 mEq Oral Given 06/11/23 1026)  lactated ringers bolus 1,000 mL (1,000 mLs Intravenous New Bag/Given 06/11/23 1025)  iohexol (OMNIPAQUE) 350 MG/ML injection 75 mL (75 mLs Intravenous Contrast Given 06/11/23 0945)    FINAL CLINICAL IMPRESSION(S) / ED DIAGNOSES   Final diagnoses:  Nausea and vomiting, unspecified vomiting type  NSTEMI (  non-ST elevated myocardial infarction) (HCC)  Prolonged Q-T interval on ECG     Rx / DC Orders   ED  Discharge Orders     None        Note:  This document was prepared using Dragon voice recognition software and may include unintentional dictation errors.   Corena Herter, MD 06/11/23 1109

## 2023-06-11 NOTE — Consult Note (Signed)
 Cardiology Consultation   Patient ID: Sonya Harmon MRN: [id number]; DOB: 1970/07/16  Admit date: 06/11/2023 Date of Consult: 06/11/2023  PCP:  Ailene Ravel, MD   [facility] Providers Cardiologist:  Julien Nordmann, MD        Patient Profile:   Sonya Harmon is a 53 y.o. female with a hx of CAD, PAD, tongue cancer, tobacco use, alcohol abuse, and hyperlipidemia who is being seen 06/11/2023 for the evaluation of chest pain at the request of Dr. Chipper Herb.  History of Present Illness:   Sonya Harmon follows with Dr. Kirke Corin for above cardiac issues.  Hospitalized 09/2021 with unstable angina.  Underwent cardiac catheterization by Dr. Welton Flakes from the right femoral artery which showed severe mid RCA stenosis.  PCI was planned but patient had a large groin hematoma.  Sonya Harmon was noted to have significant bilateral iliac disease.  Sonya Harmon underwent staged RCA PCI via the right radial artery.  Sonya Harmon has significant bilateral leg cardiac calcification due to iliac disease.  Enterography 10/2021 showed severely calcified but her aorta with no obstructive disease.  There is severe ostial left renal artery stenosis, no significant iliac disease on the right side and severe left external iliac artery stenosis extending into the distal left common iliac artery.  Successful DES placement to the left external iliac artery and drug-coated balloon angioplasty to the distal left common iliac artery without stent placement.  Sonya Harmon was most recently seen by Dr. Kirke Corin for routine follow-up 04/01/2023, at that time Sonya Harmon was doing reasonably well from a cardiac standpoint without chest pain or dyspnea, however had ongoing numbness and burning sensation in bilateral legs.   Patient reports that Sonya Harmon was seen in the ED 2/13 with nausea and vomiting and prescribed Zofran. These symptoms had resolved and Sonya Harmon he had been in her usual state of health until yesterday morning when Sonya Harmon started to feel nauseous again. Sonya Harmon had associated  abdominal pain, shortness of breath, and vomiting. After vomiting several times throughout the day Sonya Harmon started to have pain in her chest and burning in her throat. At that time Sonya Harmon decided to report to the ED. Sonya Harmon denies palpitations, diaphoresis, and fever. Sonya Harmon does smoke 1/2 ppd of cigarettes. Sonya Harmon denies alcohol and illicit drug use.   In the ED, BP 126/96, HR at 127, RR 21, SpO2 98%.  CBC with WBC 22.6, hemoglobin 17.5, hematocrit 51.9, MCV 101.6, MCH 34.2, platelets 471.  CMP with sodium 131, potassium 2.7 and creatinine 1.39, and abnormal liver function tests. Troponin elevated at 4098>1191. CK mildly elevated at 551. Lactic acid 1.7>2.3. EKG shows sinus tachycardia with nonspecific ST changes likely rate related. CT chest abdomen and pelvis with findings of severe esophagitis. Patient was started on IV heparin, given oral potassium replacement, IV magnesium, IV fluids, antiemetics, IV Protonix, and GI cocktail. Sonya Harmon was admitted for NSTEMI, hypokalemia, and severe esophagitis.  Past Medical History:  Diagnosis Date   Alcohol abuse    Coronary artery disease 09/2021   s/p PCI to RCA in setting of unstable angina   History of tongue cancer    PAD (peripheral artery disease) (HCC)    Tobacco abuse     Past Surgical History:  Procedure Laterality Date   ABDOMINAL AORTOGRAM W/LOWER EXTREMITY N/A 10/15/2021   Procedure: ABDOMINAL AORTOGRAM W/LOWER EXTREMITY;  Surgeon: Iran Ouch, MD;  Location: [facility];  Service: Cardiovascular;  Laterality: N/A;   ABDOMINAL AORTOGRAM W/LOWER EXTREMITY N/A 12/30/2022   Procedure: ABDOMINAL AORTOGRAM  W/LOWER EXTREMITY;  Surgeon: Iran Ouch, MD;  Location: [facility];  Service: Cardiovascular;  Laterality: N/A;   BILATERAL SALPINGECTOMY     BREAST BIOPSY Left 06/03/2023   [place] Bx, Heart Clip, Path pending   BREAST BIOPSY Left 06/03/2023   [place] LT BREAST BX W LOC DEV 1ST LESION IMG BX SPEC US GUIDE 06/03/2023 [facility]    COLONOSCOPY WITH PROPOFOL N/A 03/11/2022   Procedure: COLONOSCOPY WITH PROPOFOL;  Surgeon: Toney Reil, MD;  Location: [facility] ENDOSCOPY;  Service: Gastroenterology;  Laterality: N/A;   COLONOSCOPY WITH PROPOFOL N/A 03/18/2023   Procedure: COLONOSCOPY WITH PROPOFOL;  Surgeon: Toney Reil, MD;  Location: [facility] ENDOSCOPY;  Service: Gastroenterology;  Laterality: N/A;   CORONARY BALLOON ANGIOPLASTY N/A 09/08/2021   Procedure: CORONARY BALLOON ANGIOPLASTY - ABORTED;  Surgeon: Iran Ouch, MD;  Location: [facility] INVASIVE CV LAB;  Service: Cardiovascular;  Laterality: N/A;   CORONARY STENT INTERVENTION N/A 09/10/2021   Procedure: CORONARY STENT INTERVENTION;  Surgeon: Iran Ouch, MD;  Location: [facility] INVASIVE CV LAB;  Service: Cardiovascular;  Laterality: N/A;   ESOPHAGOGASTRODUODENOSCOPY (EGD) WITH PROPOFOL N/A 11/01/2021   Procedure: ESOPHAGOGASTRODUODENOSCOPY (EGD) WITH PROPOFOL;  Surgeon: Toney Reil, MD;  Location[facility]COPY;  Service: Gastroenterology;  Laterality: N/A;   ESOPHAGOGASTRODUODENOSCOPY (EGD) WITH PROPOFOL N/A 03/11/2022   Procedure: ESOPHAGOGASTRODUODENOSCOPY (EGD) WITH PROPOFOL;  Surgeon: Toney Reil, MD;  Location: [facility] ENDOSCOPY;  Service: Gastroenterology;  Laterality: N/A;   LEFT HEART CATH AND CORONARY ANGIOGRAPHY N/A 09/08/2021   Procedure: LEFT HEART CATH AND CORONARY ANGIOGRAPHY;  Surgeon: Laurier Nancy, MD;  Location: [facility] INVASIVE CV LAB;  Service: Cardiovascular;  Laterality: N/A;   PERIPHERAL VASCULAR INTERVENTION  10/15/2021   Procedure: PERIPHERAL VASCULAR INTERVENTION;  Surgeon: Iran Ouch, MD;  Location: [facility];  Service: Cardiovascular;;   POLYPECTOMY  03/18/2023   Procedure: POLYPECTOMY;  Surgeon: Toney Reil, MD;  Location: [facility] ENDOSCOPY;  Service: Gastroenterology;;   TONGUE SURGERY         Inpatient Medications: Scheduled Meds:  atorvastatin  80 mg Oral Daily   buPROPion  150 mg Oral  BID   clopidogrel  75 mg Oral Daily   escitalopram  10 mg Oral Daily   gabapentin  300 mg Oral TID   metoprolol tartrate  25 mg Oral BID   pantoprazole (PROTONIX) IV  40 mg Intravenous Q12H   sucralfate  1 g Oral TID WC & HS   varenicline  1 mg Oral BID   Continuous Infusions:  sodium chloride     heparin 500 Units/hr (06/11/23 1206)   PRN Meds: metoCLOPramide (REGLAN) injection  Allergies:    Allergies  Allergen Reactions   Aspirin Other (See Comments)    Bleeding ulcers   Amoxicill-Clarithro-Lansopraz Nausea And Vomiting   Amoxicillin Rash    Social History:   Social History   Socioeconomic History   Marital status: Married    Spouse name: Not on file   Number of children: Not on file   Years of education: Not on file   Highest education level: Not on file  Occupational History   Not on file  Tobacco Use   Smoking status: Every Day    Current packs/day: 0.00    Average packs/day: 0.5 packs/day for 35.0 years (17.5 ttl pk-yrs)    Types: Cigarettes    Start date: 09/08/1986    Last attempt to quit: 09/07/2021    Years since quitting: 1.7   Smokeless tobacco: Former  Vaping Use   Vaping status: Former  Substance and Sexual Activity   Alcohol use: Yes    Alcohol/week: 42.0 standard drinks of alcohol    Types: 42 Cans of beer per week    Comment: quit in july 2023   Drug use: Not Currently    Types: Marijuana   Sexual activity: Yes    Partners: Male  Other Topics Concern   Not on file  Social History Narrative   ** Merged History Encounter **       Social Drivers of Corporate investment banker Strain: Not on file  Food Insecurity: Not on file  Transportation Needs: Not on file  Physical Activity: Not on file  Stress: Not on file  Social Connections: Unknown (08/17/2021)   [facility]   Social Network    Social Network: Not on file  Intimate Partner Violence: Unknown (07/09/2021)   Received from [facility], [facility]    HITS    Physically Hurt: Not on file    Insult or Talk Down To: Not on file    Threaten Physical Harm: Not on file    Scream or Curse: Not on file    Family History:    Family History  Problem Relation Age of Onset   Cancer Mother    Cancer Father    Stroke Sister    Aneurysm Sister    Breast cancer Neg Hx     ROS:  Please see the history of present illness.   Physical Exam/Data:   Vitals:   06/11/23 0733 06/11/23 0735 06/11/23 0830 06/11/23 1411  BP:  (!) 126/96 (!) 147/92 97/66  Pulse:  (!) 127 (!) 109 87  Resp:  (!) 21 (!) 23 (!) 23  Temp:  97.6 F (36.4 C)  98.6 F (37 C)  TempSrc:  Oral  Oral  SpO2:  98% 99% 98%  Weight: 43.1 kg     Height: 5\' 2"  (1.575 m)       Intake/Output Summary (Last 24 hours) at 06/11/2023 1417 Last data filed at 06/11/2023 1029 Gross per 24 hour  Intake 1000 ml  Output --  Net 1000 ml      06/11/2023    7:33 AM 05/20/2023    4:05 PM 04/01/2023   10:09 AM  Last 3 Weights  Weight (lbs) 95 lb 97 lb 105 lb 4 oz  Weight (kg) 43.092 kg 43.999 kg 47.741 kg     Body mass index is 17.38 kg/m.  General:  Well nourished, well developed, in no acute distress HEENT: normal Neck: no JVD Cardiac:  normal S1, S2; RRR; no murmur  Lungs:  Abd: soft, nontender, no hepatomegaly  Ext: no edema Skin: warm and dry  Psych:  Normal affect   EKG:  The EKG was personally reviewed and demonstrates:  Sinus tachycardia rate 118 with nonspecific ST changes Telemetry:  Telemetry was personally reviewed and demonstrates: sinus tachycardia  Relevant CV Studies:  10/29/2022 Echo complete 1. Left ventricular ejection fraction, by estimation, is 60 to 65%. The  left ventricle has normal function. The left ventricle has no regional  wall motion abnormalities. Left ventricular diastolic parameters were  normal. The average left ventricular  global longitudinal strain is -23.2 %.   2. Right ventricular systolic function is normal. The right ventricular   size is normal. Tricuspid regurgitation signal is inadequate for assessing  PA pressure.   3. The mitral valve is normal in structure. Mild mitral valve  regurgitation. No evidence of mitral stenosis.   4. The aortic valve is tricuspid. Aortic valve regurgitation is not  visualized. No aortic stenosis is present.   5. The inferior vena cava is normal in size with greater than 50%  respiratory variability, suggesting right atrial pressure of 3 mmHg.   09/10/2021 LHC   Laboratory Data:  High Sensitivity Troponin:   Recent Labs  Lab 06/11/23 0813 06/11/23 0853  TROPONINIHS 2,960* 3,286*     Chemistry Recent Labs  Lab 06/11/23 0853  NA 131*  K 2.7*  CL 80*  CO2 19*  GLUCOSE 157*  BUN 10  CREATININE 1.39*  CALCIUM 10.0  MG 2.1  GFRNONAA 46*  ANIONGAP 32*    Recent Labs  Lab 06/11/23 0853  PROT 9.7*  ALBUMIN 5.7*  AST 83*  ALT 37  ALKPHOS 74  BILITOT 2.6*   Lipids No results for input(s): "CHOL", "TRIG", "HDL", "LABVLDL", "LDLCALC", "CHOLHDL" in the last 168 hours.  Hematology Recent Labs  Lab 06/11/23 0813  WBC 22.6*  RBC 5.11  HGB 17.5*  HCT 51.9*  MCV 101.6*  MCH 34.2*  MCHC 33.7  RDW 14.5  PLT 471*   Thyroid No results for input(s): "TSH", "FREET4" in the last 168 hours.  BNPNo results for input(s): "BNP", "PROBNP" in the last 168 hours.  DDimer No results for input(s): "DDIMER" in the last 168 hours.   Radiology/Studies:  CT Angio Chest/Abd/Pel for Dissection W and/or W/WO Result Date: 06/11/2023 IMPRESSION: 1. Normal caliber of the thoracic and abdominal aorta. Severe mixed aortic atherosclerosis, significantly advanced for patient age. No evidence of aneurysm, dissection, or other acute aortic pathology. 2. Severe circumferential wall thickening involving the entirety of the esophagus, consistent with nonspecific infectious or inflammatory esophagitis. 3. Minimal emphysema and smoking-related respiratory bronchiolitis. 4. Coronary artery disease.  5. Hepatic steatosis. Aortic Atherosclerosis (ICD10-I70.0) and Emphysema (ICD10-J43.9). Electronically Signed   By: Jearld Lesch M.D.   On: 06/11/2023 10:38   DG Chest 2 View Result Date: 06/11/2023 IMPRESSION: No active cardiopulmonary disease. Electronically Signed   By: Lupita Raider M.D.   On: 06/11/2023 10:28   Assessment and Plan:   Chest pain Elevated troponin CAD - Hx CAD with LHC 09/2021 with PCI and DES to the mid RCA with proximal 30% stenosis of RCA - Presented 3/7 with intermittent chest discomfort - EKG shows sinus tachycardia with up-sloping ST depressions, likely rate related - Troponin [id number] - Obstructive disease vs demand ischemia in the setting of vomiting and tachycardia - Patient will require ischemic evaluation. However, not a good candidate for cardiac catheterization at this time with active vomiting, dehydration, electrolyte abnormalities, and possible peptic ulcer - Continue PTA statin, Plavix, and BB - Echo ordered, further recommendations pending results - Continue IV heparin  Nausea/Vomiting Abdominal pain Esophagitis - Patient with history of peptic ulcer which could be causing symptoms  - CT with signs of acute esophagitis - Recommend GI consult - Management per IM  Hyponatremia Hypokalemia Dehydration - Secondary to vomiting  - Continue repletion as needed  Mild rhabdomyolysis - CK mildly elevated and UA with myoglobin present - Management per IM  Risk Assessment/Risk Scores:        :[id number]}   TIMI Risk Score for Unstable Angina or Non-ST Elevation MI:   The patient's TIMI risk score is 3, which indicates a 13% risk of all cause mortality, new or recurrent myocardial infarction or need for urgent revascularization in the next 14 days.{  For questions  or updates, please contact [facility] Please consult [email or web address] for contact info under    Signed, Orion Crook, PA-C  06/11/2023 2:17 PM

## 2023-06-11 NOTE — ED Notes (Signed)
 Patient transported to CT

## 2023-06-11 NOTE — Consult Note (Signed)
 Pharmacy Consult Note - Anticoagulation  Pharmacy Consult for heparin Indication: chest pain/ACS  PATIENT MEASUREMENTS: Height: 5\' 2"  (157.5 cm) Weight: 43.1 kg (95 lb) IBW/kg (Calculated) : 50.1 HEPARIN DW (KG): 43.1  VITAL SIGNS: Temp: 98 F (36.7 C) (03/07 1633) Temp Source: Oral (03/07 1411) BP: 130/73 (03/07 1633) Pulse Rate: 91 (03/07 1633)  Recent Labs    06/11/23 0813 06/11/23 0853 06/11/23 1648  HGB 17.5*  --   --   HCT 51.9*  --   --   PLT 471*  --   --   APTT 26  --   --   LABPROT 12.6  --   --   INR 0.9  --   --   HEPARINUNFRC  --   --  0.16*  CREATININE  --  1.39*  --   CKTOTAL 551*  --   --   TROPONINIHS 2,960* 3,286* 3,478*    Estimated Creatinine Clearance: 32.9 mL/min (A) (by C-G formula based on SCr of 1.39 mg/dL (H)).  PAST MEDICAL HISTORY: Past Medical History:  Diagnosis Date   Alcohol abuse    Coronary artery disease 09/2021   s/p PCI to RCA in setting of unstable angina   History of tongue cancer    PAD (peripheral artery disease) (HCC)    Tobacco abuse     ASSESSMENT: 53 y.o. female with PMH including unstable angina, CAD s/p PCI (09/2021), HTN, tobacco use, CVA, superior mesenteric artery stenosis, PAD, PUD is presenting with chest pain and concerns for ACS. Chest pain started after episode of vomiting yesterday. Cardiac troponins trending up, currently at 3,286. Patient is not on chronic anticoagulation per chart review. Pharmacy has been consulted to initiate and manage heparin intravenous infusion.  Pertinent medications: Not on chronic anticoagulation prior to admission per chart review   Goal(s) of therapy: Heparin level 0.3 - 0.7 units/mL Monitor platelets by anticoagulation protocol: Yes   Baseline anticoagulation labs: Recent Labs    06/11/23 0813  APTT 26  INR 0.9  HGB 17.5*  PLT 471*    Date Time HL Rate/Comment 3/7 1648 0.16 Subtherapeutic@500  units/hr    PLAN: Give 1200 units bolus x1 Increase heparin  infusion to 650 units/hour. Check heparin level in 6 hours after rate change Monitor CBC daily while on heparin infusion.  Bettey Costa, PharmD Clinical Pharmacist 06/11/2023 6:57 PM

## 2023-06-11 NOTE — ED Notes (Signed)
 Dr. Arnoldo Morale notified of critical troponin. Dr. Arnoldo Morale at bedside at time of notification. Pt denies chest pain or discomfort at this time. Reports only mild throat discomfort due to emesis.

## 2023-06-11 NOTE — Progress Notes (Signed)
  Evaluation after Contrast Extravasation  Patient seen and examined immediately after contrast extravasation while in CT scanner 1.  Exam: There is moderate swelling at the left ac area.  There is no erythema. There is no discoloration. There are no blisters. There are no signs of decreased perfusion of the skin.  It is is warm to touch.  The patient has full ROM in fingers.  Radial pulse is normal.  Per contrast extravasation protocol, I have instructed the patient to keep an ice pack on the area for 20-60 minutes at a time for about 48 hours.   Keep arm elevated as much as possible.   The patient understands to call the radiology department if there is: - increase in pain or swelling - changed or altered sensation - ulceration or blistering - increasing redness - warmth or increasing firmness - decreased tissue perfusion as noted by decreased capillary refill or discoloration of skin - decreased pulses peripheral to site   Alene Mires PA-C 06/11/2023 10:11 AM

## 2023-06-12 ENCOUNTER — Inpatient Hospital Stay (HOSPITAL_COMMUNITY)
Admit: 2023-06-12 | Discharge: 2023-06-12 | Disposition: A | Discharge status: Home / Self Care | Attending: Internal Medicine | Admitting: Internal Medicine

## 2023-06-12 DIAGNOSIS — M6282 Rhabdomyolysis: Secondary | ICD-10-CM

## 2023-06-12 DIAGNOSIS — F1010 Alcohol abuse, uncomplicated: Secondary | ICD-10-CM | POA: Diagnosis not present

## 2023-06-12 DIAGNOSIS — R112 Nausea with vomiting, unspecified: Secondary | ICD-10-CM | POA: Diagnosis not present

## 2023-06-12 DIAGNOSIS — R9431 Abnormal electrocardiogram [ECG] [EKG]: Secondary | ICD-10-CM | POA: Diagnosis not present

## 2023-06-12 DIAGNOSIS — I214 Non-ST elevation (NSTEMI) myocardial infarction: Secondary | ICD-10-CM

## 2023-06-12 DIAGNOSIS — E876 Hypokalemia: Secondary | ICD-10-CM

## 2023-06-12 DIAGNOSIS — E860 Dehydration: Secondary | ICD-10-CM

## 2023-06-12 DIAGNOSIS — I25118 Atherosclerotic heart disease of native coronary artery with other forms of angina pectoris: Secondary | ICD-10-CM

## 2023-06-12 LAB — ECHOCARDIOGRAM COMPLETE
AR max vel: 2.56 cm2
AV Peak grad: 7.5 mmHg
Ao pk vel: 1.37 m/s
Area-P 1/2: 3.66 cm2
Height: 62 in
MV M vel: 4.64 m/s
MV Peak grad: 86.1 mmHg
S' Lateral: 2.8 cm
Weight: 1520 [oz_av]

## 2023-06-12 LAB — CBC
HCT: 34.9 % — ABNORMAL LOW (ref 36.0–46.0)
Hemoglobin: 12.5 g/dL (ref 12.0–15.0)
MCH: 34.2 pg — ABNORMAL HIGH (ref 26.0–34.0)
MCHC: 35.8 g/dL (ref 30.0–36.0)
MCV: 95.6 fL (ref 80.0–100.0)
Platelets: 301 10*3/uL (ref 150–400)
RBC: 3.65 MIL/uL — ABNORMAL LOW (ref 3.87–5.11)
RDW: 14.1 % (ref 11.5–15.5)
WBC: 15 10*3/uL — ABNORMAL HIGH (ref 4.0–10.5)
nRBC: 0 % (ref 0.0–0.2)

## 2023-06-12 LAB — BASIC METABOLIC PANEL
Anion gap: 16 — ABNORMAL HIGH (ref 5–15)
BUN: 6 mg/dL (ref 6–20)
CO2: 20 mmol/L — ABNORMAL LOW (ref 22–32)
Calcium: 8.2 mg/dL — ABNORMAL LOW (ref 8.9–10.3)
Chloride: 95 mmol/L — ABNORMAL LOW (ref 98–111)
Creatinine, Ser: 0.62 mg/dL (ref 0.44–1.00)
GFR, Estimated: >60 mL/min (ref >60)
Glucose, Bld: 111 mg/dL — ABNORMAL HIGH (ref 70–99)
Potassium: 2.6 mmol/L — CL (ref 3.5–5.1)
Sodium: 131 mmol/L — ABNORMAL LOW (ref 135–145)

## 2023-06-12 LAB — CK: Total CK: 907 U/L — ABNORMAL HIGH (ref 38–234)

## 2023-06-12 LAB — TROPONIN I (HIGH SENSITIVITY)
Troponin I (High Sensitivity): 2006 ng/L (ref <18)
Troponin I (High Sensitivity): 1465 ng/L (ref <18)

## 2023-06-12 LAB — HEPARIN LEVEL (UNFRACTIONATED)
Heparin Unfractionated: 0.2 [IU]/mL — ABNORMAL LOW (ref 0.30–0.70)
Heparin Unfractionated: 0.12 [IU]/mL — ABNORMAL LOW (ref 0.30–0.70)
Heparin Unfractionated: 0.38 [IU]/mL (ref 0.30–0.70)
Heparin Unfractionated: 0.4 [IU]/mL (ref 0.30–0.70)

## 2023-06-12 LAB — MAGNESIUM: Magnesium: 2.1 mg/dL (ref 1.7–2.4)

## 2023-06-12 LAB — POTASSIUM: Potassium: 3.2 mmol/L — ABNORMAL LOW (ref 3.5–5.1)

## 2023-06-12 LAB — LACTIC ACID, PLASMA
Lactic Acid, Venous: 0.9 mmol/L (ref 0.5–1.9)
Lactic Acid, Venous: 0.7 mmol/L (ref 0.5–1.9)

## 2023-06-12 MED ORDER — ADULT MULTIVITAMIN W/MINERALS CH
1.0000 | ORAL_TABLET | Freq: Every day | ORAL | Status: DC
  Administered 2023-06-12 – 2023-06-15 (×4): 1 via ORAL
  Filled 2023-06-12 (×4): qty 1

## 2023-06-12 MED ORDER — LORAZEPAM 2 MG/ML IJ SOLN
1.0000 mg | INTRAMUSCULAR | Status: AC | PRN
  Administered 2023-06-12: 1 mg via INTRAVENOUS
  Filled 2023-06-12: qty 1

## 2023-06-12 MED ORDER — THIAMINE MONONITRATE 100 MG PO TABS
100.0000 mg | ORAL_TABLET | Freq: Every day | ORAL | Status: DC
  Administered 2023-06-13 – 2023-06-15 (×3): 100 mg via ORAL
  Filled 2023-06-12 (×3): qty 1

## 2023-06-12 MED ORDER — THIAMINE HCL 100 MG/ML IJ SOLN
100.0000 mg | Freq: Every day | INTRAMUSCULAR | Status: DC
  Administered 2023-06-12: 100 mg via INTRAVENOUS
  Filled 2023-06-12: qty 2

## 2023-06-12 MED ORDER — HEPARIN BOLUS VIA INFUSION
1200.0000 [IU] | Freq: Once | INTRAVENOUS | Status: AC
  Administered 2023-06-12: 1200 [IU] via INTRAVENOUS
  Filled 2023-06-12: qty 1200

## 2023-06-12 MED ORDER — FOLIC ACID 1 MG PO TABS
1.0000 mg | ORAL_TABLET | Freq: Every day | ORAL | Status: DC
  Administered 2023-06-12 – 2023-06-15 (×4): 1 mg via ORAL
  Filled 2023-06-12 (×4): qty 1

## 2023-06-12 MED ORDER — LORAZEPAM 1 MG PO TABS: 1.0000 mg | ORAL_TABLET | ORAL | Status: AC | PRN

## 2023-06-12 MED ORDER — POTASSIUM CHLORIDE 10 MEQ/100ML IV SOLN
10.0000 meq | INTRAVENOUS | Status: AC
  Administered 2023-06-12 (×4): 10 meq via INTRAVENOUS
  Filled 2023-06-12 (×4): qty 100

## 2023-06-12 MED ORDER — POTASSIUM CHLORIDE CRYS ER 20 MEQ PO TBCR
40.0000 meq | EXTENDED_RELEASE_TABLET | Freq: Once | ORAL | Status: AC
  Administered 2023-06-12: 40 meq via ORAL
  Filled 2023-06-12: qty 2

## 2023-06-12 MED ORDER — ONDANSETRON HCL 4 MG/2ML IJ SOLN
4.0000 mg | Freq: Once | INTRAMUSCULAR | Status: AC
  Administered 2023-06-12: 4 mg via INTRAVENOUS
  Filled 2023-06-12: qty 2

## 2023-06-12 MED ORDER — SODIUM CHLORIDE 0.9 % IV SOLN
12.5000 mg | Freq: Four times a day (QID) | INTRAVENOUS | Status: DC | PRN
  Administered 2023-06-12 – 2023-06-14 (×4): 12.5 mg via INTRAVENOUS
  Filled 2023-06-12 (×2): qty 12.5
  Filled 2023-06-12: qty 0.5
  Filled 2023-06-12 (×2): qty 12.5

## 2023-06-12 MED ORDER — POTASSIUM CHLORIDE CRYS ER 20 MEQ PO TBCR
40.0000 meq | EXTENDED_RELEASE_TABLET | Freq: Once | ORAL | Status: DC
  Filled 2023-06-12: qty 2

## 2023-06-12 MED ORDER — HEPARIN BOLUS VIA INFUSION
600.0000 [IU] | Freq: Once | INTRAVENOUS | Status: AC
  Administered 2023-06-12: 600 [IU] via INTRAVENOUS
  Filled 2023-06-12: qty 600

## 2023-06-12 MED ORDER — POTASSIUM CHLORIDE 20 MEQ PO PACK
40.0000 meq | PACK | Freq: Once | ORAL | Status: AC
  Administered 2023-06-12: 40 meq via ORAL
  Filled 2023-06-12: qty 2

## 2023-06-12 NOTE — Consult Note (Signed)
 Pharmacy Consult Note - Anticoagulation  Pharmacy Consult for heparin Indication: chest pain/ACS  PATIENT MEASUREMENTS: Height: 5\' 2"  (157.5 cm) Weight: 43.1 kg (95 lb) IBW/kg (Calculated) : 50.1 HEPARIN DW (KG): 43.1  VITAL SIGNS: Temp: 98 F (36.7 C) (03/08 0752) Temp Source: Oral (03/08 0029) BP: 117/72 (03/08 0752) Pulse Rate: 78 (03/08 0752)  Recent Labs    06/11/23 0813 06/11/23 0853 06/11/23 1648 06/12/23 0135 06/12/23 0847  HGB 17.5*  --   --  12.5  --   HCT 51.9*  --   --  34.9*  --   PLT 471*  --   --  301  --   APTT 26  --   --   --   --   LABPROT 12.6  --   --   --   --   INR 0.9  --   --   --   --   HEPARINUNFRC  --    < > 0.16* 0.20* 0.12*  CREATININE  --    < >  --  0.62  --   CKTOTAL 551*  --   --  907*  --   TROPONINIHS 2,960*   < > 3,478*  --   --    < > = values in this interval not displayed.    Estimated Creatinine Clearance: 57.2 mL/min (by C-G formula based on SCr of 0.62 mg/dL).  PAST MEDICAL HISTORY: Past Medical History:  Diagnosis Date   Alcohol abuse    Coronary artery disease 09/2021   s/p PCI to RCA in setting of unstable angina   History of tongue cancer    PAD (peripheral artery disease) (HCC)    Tobacco abuse     ASSESSMENT: 53 y.o. female with PMH including unstable angina, CAD s/p PCI (09/2021), HTN, tobacco use, CVA, superior mesenteric artery stenosis, PAD, PUD is presenting with chest pain and concerns for ACS. Chest pain started after episode of vomiting yesterday. Cardiac troponins trending up, currently at 3,286. Patient is not on chronic anticoagulation per chart review. Pharmacy has been consulted to initiate and manage heparin intravenous infusion.  Pertinent medications: Not on chronic anticoagulation prior to admission per chart review   Goal(s) of therapy: Heparin level 0.3 - 0.7 units/mL Monitor platelets by anticoagulation protocol: Yes   Baseline anticoagulation labs: Recent Labs    06/11/23 0813  06/12/23 0135  APTT 26  --   INR 0.9  --   HGB 17.5* 12.5  PLT 471* 301    Date Time HL Rate/Comment 3/7 1648 0.16 Subtherapeutic@500  units/hr 3/8 0135 0.20 Subtherapeutic@650  units/hr 3/8 0847 0.12 Subtherapeutic@750  units/hr    PLAN: Give 1200 units bolus x1 Increase heparin infusion to 900 units/hour. Recheck heparin level in 6 hours after rate change Monitor CBC daily while on heparin infusion.  Bettey Costa, PharmD Clinical Pharmacist 06/12/2023 10:48 AM

## 2023-06-12 NOTE — Consult Note (Signed)
 Pharmacy Consult Note - Anticoagulation  Pharmacy Consult for heparin Indication: chest pain/ACS  PATIENT MEASUREMENTS: Height: 5\' 2"  (157.5 cm) Weight: 43.1 kg (95 lb) IBW/kg (Calculated) : 50.1 HEPARIN DW (KG): 43.1  VITAL SIGNS: Temp: 98.5 F (36.9 C) (03/08 1542) BP: 151/95 (03/08 1542) Pulse Rate: 95 (03/08 1542)  Recent Labs    06/11/23 0813 06/11/23 0853 06/12/23 0135 06/12/23 0847 06/12/23 1252 06/12/23 1657  HGB 17.5*  --  12.5  --   --   --   HCT 51.9*  --  34.9*  --   --   --   PLT 471*  --  301  --   --   --   APTT 26  --   --   --   --   --   LABPROT 12.6  --   --   --   --   --   INR 0.9  --   --   --   --   --   HEPARINUNFRC  --    < > 0.20*   < >  --  0.38  CREATININE  --    < > 0.62  --   --   --   CKTOTAL 551*  --  907*  --   --   --   TROPONINIHS 2,960*   < >  --    < > 1,465*  --    < > = values in this interval not displayed.    Estimated Creatinine Clearance: 57.2 mL/min (by C-G formula based on SCr of 0.62 mg/dL).  PAST MEDICAL HISTORY: Past Medical History:  Diagnosis Date   Alcohol abuse    Coronary artery disease 09/2021   s/p PCI to RCA in setting of unstable angina   History of tongue cancer    PAD (peripheral artery disease) (HCC)    Tobacco abuse     ASSESSMENT: 53 y.o. female with PMH including unstable angina, CAD s/p PCI (09/2021), HTN, tobacco use, CVA, superior mesenteric artery stenosis, PAD, PUD is presenting with chest pain and concerns for ACS. Chest pain started after episode of vomiting yesterday. Cardiac troponins trending up, currently at 3,286. Patient is not on chronic anticoagulation per chart review. Pharmacy has been consulted to initiate and manage heparin intravenous infusion.  Pertinent medications: Not on chronic anticoagulation prior to admission per chart review   Goal(s) of therapy: Heparin level 0.3 - 0.7 units/mL Monitor platelets by anticoagulation protocol: Yes   Baseline anticoagulation  labs: Recent Labs    06/11/23 0813 06/12/23 0135  APTT 26  --   INR 0.9  --   HGB 17.5* 12.5  PLT 471* 301    Date Time HL Rate/Comment 3/7 1648 0.16 Subtherapeutic@500  units/hr 3/8 0135 0.20 Subtherapeutic@650  units/hr 3/8 0847 0.12 Subtherapeutic@750  units/hr 3/8 1657 0.38 Therapeutic x 1@900  units/hr    PLAN: HL therapeutic x 1 Continue heparin infusion at 900 units/hour. Recheck heparin level in 6 hours to confirm Monitor CBC daily while on heparin infusion.  Barrie Folk, PharmD Clinical Pharmacist 06/12/2023 5:32 PM

## 2023-06-12 NOTE — Progress Notes (Signed)
  Echocardiogram 2D Echocardiogram has been performed.  Lenor Coffin 06/12/2023, 9:47 AM

## 2023-06-12 NOTE — Assessment & Plan Note (Signed)
 Worsening CK 551>>907 -Holding statin -Giving some IV fluid -Continue to monitor

## 2023-06-12 NOTE — Assessment & Plan Note (Signed)
 As evident by hemoconcentration and AKI. AKI has been resolved -Continue with IV fluid for another day

## 2023-06-12 NOTE — Assessment & Plan Note (Signed)
 Persistent hypokalemia likely due to GI losses with ongoing nausea and vomiting.  Magnesium normal -Replete electrolyte and monitor

## 2023-06-12 NOTE — Progress Notes (Signed)
 Progress Note   Patient: Sonya Harmon ZOX:[id number] DOB: Apr 28, 1970 DOA: 06/11/2023     1 DOS: the patient was seen and examined on 06/12/2023   Brief hospital course: Taken from H&P.  Sonya Harmon is a 53 y.o. female with medical history significant of CAD status post RCA stenting, HTN, PAD status post intervention, presented with worsening of epigastric pain, chest pain and persistent nausea with vomiting.   Patient was having intermittent epigastric pain for about 2 to 3 months,not associated with meal and no significant nocturnal pain.  Recently developed nausea associated with meals which decreased her p.o. intake and she has lost about 10 pounds in the last 2 months.  On presentation mild tachycardia, otherwise stable vital.  Labs with potassium of 2.7, sodium 131, leukocytosis at 22, hemoglobin 17.5, creatinine 1.3, bilirubin 2.6,  troponin 2900> 3200, CK5 51.  EKG showed no acute ST changes.  CTA chest, abdomen and pelvis with normal aorta, severe mixed aortic atherosclerosis.  Shows severe circumferential wall thickening involving the entirely of esophagus consistent with nonspecific infectious or inflammatory esophagitis.  She was started on heparin infusion and cardiology was consulted.  GI was consulted for concern of severe esophagitis and they are recommending PPI, Carafate and possible antifungal treatment if indicated.  No EGD in 6 weeks for the concern of NSTEMI.  3/8: Vital stable, worsening CK at 907-holding statin, rising troponin so we will continue to trend.  Improving leukocytosis at 15, all cell line decreased likely concentrated yesterday.  Potassium of 2.6 which were repleted.  Magnesium   UA with significant ketone and protein urea.  AKI resolved Troponin peaked at 3478 and now trending down.  Lactic acidosis resolved Echocardiogram with normal EF, no regional wall motion abnormalities and indeterminate diastolic parameter.  Patient has a known CAD s/p stenting in  2023.  Cardiology is recommending continue heparin for 48 hours and likely cath on Monday.     Assessment and Plan: * NSTEMI (non-ST elevated myocardial infarction) [facility]) Patient with nonspecific chest pain, associated nausea and vomiting.  Known history of significant CAD s/p PCI in 2023.  Troponin peaked at 3400 and now trending down. Cardiology is on board -Continue heparin infusion for 48 hours -Likely will get cardiac cath on Monday -Continue statin, Plavix and beta-blocker  Nausea and vomiting Esophagitis.  CTA with no PE or dissection but did show severe circumferential wall thickening involving the entire esophagus, consistent with nonspecific infectious or inflammatory esophagitis. GI was consulted but they are not recommending any procedure or EGD due to concern of recent NSTEMI. -Continue with twice daily IV Protonix -Supportive care for nausea and vomiting  Severe dehydration As evident by hemoconcentration and AKI. AKI has been resolved -Continue with IV fluid for another day  Hypokalemia Persistent hypokalemia likely due to GI losses with ongoing nausea and vomiting.  Magnesium normal -Replete electrolyte and monitor  Hyponatremia Mild and stable hyponatremia with sodium at 131. Likely due to alcohol abuse -Continue to monitor  Rhabdomyolysis Worsening CK 551>>907 -Holding statin -Giving some IV fluid -Continue to monitor  Alcohol abuse Counseling was provided. -CIWA protocol   Subjective: Patient continued to have some midsternal chest pain along with nausea and vomiting.  Physical Exam: Vitals:   06/12/23 0029 06/12/23 0413 06/12/23 0752 06/12/23 1158  BP: 136/75 (!) 144/77 117/72 126/81  Pulse: 70 69 78 78  Resp: 18  16 16   Temp: 98.9 F (37.2 C) 98.1 F (36.7 C) 98 F (36.7 C) 98.6 F (  37 C)  TempSrc: Oral     SpO2: 99% 97% 100% 99%  Weight:      Height:       General.  Malnourished lady, in no acute distress. Pulmonary.  Lungs clear  bilaterally, normal respiratory effort. CV.  Regular rate and rhythm, no JVD, rub or murmur. Abdomen.  Soft, nontender, nondistended, BS positive. CNS.  Alert and oriented .  No focal neurologic deficit. Extremities.  No edema, no cyanosis, pulses intact and symmetrical. Psychiatry.  Judgment and insight appears normal.   Data Reviewed: Prior data reviewed  Family Communication: Discussed with patient  Disposition: Status is: Inpatient Remains inpatient appropriate because: Severity of illness  Planned Discharge Destination: Home  DVT prophylaxis.  Heparin infusion Time spent: 50 minutes  This record has been created using Conservation officer, historic buildings. Errors have been sought and corrected,but may not always be located. Such creation errors do not reflect on the standard of care.   Author: Arnetha Courser, MD 06/12/2023 2:25 PM  For on call review [email or web address].

## 2023-06-12 NOTE — Consult Note (Signed)
 PHARMACY CONSULT NOTE - ELECTROLYTES  Pharmacy Consult for Electrolyte Monitoring and Replacement   Recent Labs: Height: 5\' 2"  (157.5 cm) Weight: 43.1 kg (95 lb) IBW/kg (Calculated) : 50.1 Estimated Creatinine Clearance: 57.2 mL/min (by C-G formula based on SCr of 0.62 mg/dL).  Potassium (mmol/L)  Date Value  06/12/2023 2.6 (LL)   Magnesium (mg/dL)  Date Value  16/01/9603 2.1   Calcium (mg/dL)  Date Value  54/12/8117 8.2 (L)   Albumin (g/dL)  Date Value  14/78/2956 5.7 (H)  04/01/2023 4.7   Sodium (mmol/L)  Date Value  06/12/2023 131 (L)  12/22/2022 137   Assessment  Sonya Harmon is a 53 y.o. female presenting with nausea/vomiting/abdominal pain. PMH significant for HTN, HLD, and CAD. Pharmacy has been consulted to monitor and replace electrolytes.  Diet: PO MIVF: NS @ 100 mL/hr  Goal of Therapy: Electrolytes WNL  Plan:  K+ 2.7 > 2.6 s/p KCl supplementation given 3/7 (KCl 40 mEq PO x 2 doses)  KCl 40 mEq PO x 1 plus 10 mEq IV x 4 ordered by provider today  Will check a repeat K+ level 4 hours after last dose  Add-on magnesium also in process   0308 1142:  Repeat K+ 3.2 - will order an additional KCl 40 mEq PO x 1 today  Mag 2.1; no replacement needed  Check BMP, Mag, Phos with AM labs   Thank you for allowing pharmacy to be a part of this patient's care.  Littie Deeds, PharmD Pharmacy Resident  06/12/2023 9:25 AM

## 2023-06-12 NOTE — Progress Notes (Signed)
 Cardiology Progress Note   Patient Name: Sonya Harmon Date of Encounter: 06/12/2023  Primary Cardiologist: Julien Nordmann, MD  Subjective   Persistent nausea and vomiting overnight w/ associated chest pain during vomiting episodes.  Receiving Zofran/Reglan changed to Phenergan Not much sleep secondary to GI symptoms Reports having some chest discomfort but feels it is from the vomiting Nurses report prior alcohol history, placed on CIWA protocol  Objective   Inpatient Medications    Scheduled Meds:  buPROPion  150 mg Oral BID   clopidogrel  75 mg Oral Daily   escitalopram  10 mg Oral Daily   folic acid  1 mg Oral Daily   gabapentin  300 mg Oral TID   metoprolol tartrate  25 mg Oral BID   multivitamin with minerals  1 tablet Oral Daily   pantoprazole (PROTONIX) IV  40 mg Intravenous Q12H   sucralfate  1 g Oral TID WC & HS   thiamine  100 mg Oral Daily   Or   thiamine  100 mg Intravenous Daily   varenicline  1 mg Oral BID   Continuous Infusions:  sodium chloride 100 mL/hr at 06/12/23 0546   heparin 750 Units/hr (06/12/23 0546)   promethazine (PHENERGAN) injection (IM or IVPB) 12.5 mg (06/12/23 0930)   PRN Meds: LORazepam **OR** LORazepam, promethazine (PHENERGAN) injection (IM or IVPB)   Vital Signs    Vitals:   06/11/23 2036 06/12/23 0029 06/12/23 0413 06/12/23 0752  BP: 131/76 136/75 (!) 144/77 117/72  Pulse: 68 70 69 78  Resp: 20 18  16   Temp: 98.3 F (36.8 C) 98.9 F (37.2 C) 98.1 F (36.7 C) 98 F (36.7 C)  TempSrc: Oral Oral    SpO2: 97% 99% 97% 100%  Weight:      Height:        Intake/Output Summary (Last 24 hours) at 06/12/2023 0952 Last data filed at 06/12/2023 0546 Gross per 24 hour  Intake 2599.28 ml  Output --  Net 2599.28 ml   Filed Weights   06/11/23 0733  Weight: 43.1 kg    Physical Exam   GEN: Well nourished, well developed, in no acute distress.  HEENT: Grossly normal.  Neck: Supple, no JVD, carotid bruits, or masses. Cardiac:  RRR, no murmurs, rubs, or gallops. No clubbing, cyanosis, edema.  Radials 2+, DP/PT 2+ and equal bilaterally.  Respiratory:  Respirations regular and unlabored, clear to auscultation bilaterally. GI: Soft, nontender, nondistended, BS + x 4. MS: no deformity or atrophy. Skin: warm and dry, no rash. Neuro:  Strength and sensation are intact. Psych: AAOx3.  Normal affect.  Labs    Chemistry Recent Labs  Lab 06/11/23 0853 06/11/23 2012 06/12/23 0135  NA 131*  --  131*  K 2.7* 3.0* 2.6*  CL 80*  --  95*  CO2 19*  --  20*  GLUCOSE 157*  --  111*  BUN 10  --  6  CREATININE 1.39*  --  0.62  CALCIUM 10.0  --  8.2*  PROT 9.7*  --   --   ALBUMIN 5.7*  --   --   AST 83*  --   --   ALT 37  --   --   ALKPHOS 74  --   --   BILITOT 2.6*  --   --   GFRNONAA 46*  --  >60  ANIONGAP 32*  --  16*     Hematology Recent Labs  Lab 06/11/23 0813 06/12/23 0135  WBC  22.6* 15.0*  RBC 5.11 3.65*  HGB 17.5* 12.5  HCT 51.9* 34.9*  MCV 101.6* 95.6  MCH 34.2* 34.2*  MCHC 33.7 35.8  RDW 14.5 14.1  PLT 471* 301    Cardiac Enzymes  Recent Labs  Lab 06/11/23 0813 06/11/23 0853 06/11/23 1648  TROPONINIHS 2,960* 3,286* 3,478*      BNP    Component Value Date/Time   BNP 30.4 09/07/2021 1125   Lipids  Lab Results  Component Value Date   CHOL 202 (H) 04/01/2023   HDL 101 04/01/2023   LDLCALC 91 04/01/2023   TRIG 54 04/01/2023   CHOLHDL 2.0 04/01/2023    Radiology    CT Angio Chest/Abd/Pel for Dissection W and/or W/WO Result Date: 06/11/2023 CLINICAL DATA:  Acute aortic syndrome suspected, chest pain, abdominal pain, nausea and vomiting EXAM: CT ANGIOGRAPHY CHEST, ABDOMEN AND PELVIS TECHNIQUE: Non-contrast CT of the chest was initially obtained. Multidetector CT imaging through the chest, abdomen and pelvis was performed using the standard protocol during bolus administration of intravenous contrast. Multiplanar reconstructed images and MIPs were obtained and reviewed to evaluate the  vascular anatomy. RADIATION DOSE REDUCTION: This exam was performed according to the departmental dose-optimization program which includes automated exposure control, adjustment of the mA and/or kV according to patient size and/or use of iterative reconstruction technique. CONTRAST:  75mL OMNIPAQUE IOHEXOL 350 MG/ML SOLN COMPARISON:  CT chest, 05/25/2023, CT abdomen pelvis, 02/21/2023 FINDINGS: CTA CHEST FINDINGS VASCULAR Aorta: Satisfactory opacification of the aorta. Normal caliber of the thoracic aorta. Severe mixed aortic atherosclerosis with a large burden of irregular mural thrombus, predominantly involving the descending thoracic aorta. No evidence of aneurysm, dissection, or other acute aortic pathology. Cardiovascular: No evidence of pulmonary embolism on limited non-tailored examination. Normal heart size. Left and right coronary artery calcifications and stents. No pericardial effusion. Review of the MIP images confirms the above findings. NON VASCULAR Mediastinum/Nodes: No enlarged mediastinal, hilar, or axillary lymph nodes. Severe circumferential wall thickening involving the entirety of the esophagus (series 6, image 104). Thyroid and trachea demonstrate no significant findings. Lungs/Pleura: Minimal centrilobular and paraseptal emphysema. Background of fine centrilobular nodularity throughout the lungs. No pleural effusion or pneumothorax. Musculoskeletal: No chest wall abnormality. No acute osseous findings. Review of the MIP images confirms the above findings. CTA ABDOMEN AND PELVIS FINDINGS VASCULAR Normal contour and caliber of the abdominal aorta. No evidence of aneurysm, dissection, or other acute aortic pathology. Severe mixed calcific atherosclerosis. Vascular variant direct origin of the hepatic artery from the aorta with otherwise standard branching pattern of the abdominal aorta. Review of the MIP images confirms the above findings. NON-VASCULAR Hepatobiliary: No solid liver abnormality is  seen. Hepatic steatosis no gallstones, gallbladder wall thickening, or biliary dilatation. Pancreas: Unremarkable. No pancreatic ductal dilatation or surrounding inflammatory changes. Spleen: Normal in size without significant abnormality. Adrenals/Urinary Tract: Adrenal glands are unremarkable. Kidneys are normal, without renal calculi, solid lesion, or hydronephrosis. Bladder is unremarkable. Stomach/Bowel: Stomach is within normal limits. Appendix appears normal. No evidence of bowel wall thickening, distention, or inflammatory changes. Lymphatic: No enlarged abdominal or pelvic lymph nodes. Reproductive: No mass or other significant abnormality. Other: No abdominal wall hernia or abnormality. No ascites. Musculoskeletal: No acute osseous findings. IMPRESSION: 1. Normal caliber of the thoracic and abdominal aorta. Severe mixed aortic atherosclerosis, significantly advanced for patient age. No evidence of aneurysm, dissection, or other acute aortic pathology. 2. Severe circumferential wall thickening involving the entirety of the esophagus, consistent with nonspecific infectious or inflammatory esophagitis. 3. Minimal  emphysema and smoking-related respiratory bronchiolitis. 4. Coronary artery disease. 5. Hepatic steatosis. Aortic Atherosclerosis (ICD10-I70.0) and Emphysema (ICD10-J43.9). Electronically Signed   By: Jearld Lesch M.D.   On: 06/11/2023 10:38   DG Chest 2 View Result Date: 06/11/2023 CLINICAL DATA:  Chest pain. EXAM: CHEST - 2 VIEW COMPARISON:  February 20, 2023. FINDINGS: The heart size and mediastinal contours are within normal limits. Both lungs are clear. The visualized skeletal structures are unremarkable. IMPRESSION: No active cardiopulmonary disease. Electronically Signed   By: Lupita Raider M.D.   On: 06/11/2023 10:28     Telemetry    RSR - Personally Reviewed  ECG    pending - Personally Reviewed  Cardiac Studies   Percutaneous Coronary Intervention  6.2023  Diagnostic Dominance: Right  Intervention    Gilda Crease 5.78I69 _____________   2D Echocardiogram 10/2022   1. Left ventricular ejection fraction, by estimation, is 60 to 65%. The  left ventricle has normal function. The left ventricle has no regional  wall motion abnormalities. Left ventricular diastolic parameters were  normal. The average left ventricular  global longitudinal strain is -23.2 %.   2. Right ventricular systolic function is normal. The right ventricular  size is normal. Tricuspid regurgitation signal is inadequate for assessing  PA pressure.   3. The mitral valve is normal in structure. Mild mitral valve  regurgitation. No evidence of mitral stenosis.   4. The aortic valve is tricuspid. Aortic valve regurgitation is not  visualized. No aortic stenosis is present.   5. The inferior vena cava is normal in size with greater than 50%  respiratory variability, suggesting right atrial pressure of 3 mmHg.   Updated echocardiogram this admission with normal LV function no focal wall motion abnormality  Patient Profile     53 y.o. female w/ a h/o CAD s/p RCA stenting in 2023, LAD bridging, PAD, tob abuse, alcohol abuse, HL, and cancer of the tongue   Assessment & Plan    Non-STEMI Intractable nausea vomiting at home that has persisted Peak troponin 3400 now trending down 2000 Known coronary artery disease with catheterization June 2023 PCI to the mid RCA, residual proximal RCA disease -Echocardiogram with normal EF, no focal wall motion abnormality -Recommend she continue heparin infusion 48 hours which will finish tomorrow morning -Will continue to reevaluate over the weekend, could consider cardiac catheterization March 10 -Continue statin, Plavix, beta-blocker  Nausea vomiting, esophagitis Consider testing for norovirus Notes indicating prior history of alcohol PPI, Carafate, CIWA protocol EGD on hold given non-STEMI  above  Hyponatremia Secondary to GI losses, alcohol Low but stable 131 Will need continued potassium repletion  Signed, Dossie Arbour, MD, Ph.D [facility]  For questions or updates, please contact   Please consult [email or web address] for contact info under Cardiology/STEMI.

## 2023-06-12 NOTE — Progress Notes (Signed)
 Pt with significant nausea this morning saying Reglan and Zofran didn't help which night nurse confirmed. Dr. Nelson Chimes notified and phenergan ordered.

## 2023-06-12 NOTE — Assessment & Plan Note (Signed)
 Patient with nonspecific chest pain, associated nausea and vomiting.  Known history of significant CAD s/p PCI in 2023.  Troponin peaked at 3400 and now trending down. Cardiology is on board -Continue heparin infusion for 48 hours -Likely will get cardiac cath on Monday -Continue statin, Plavix and beta-blocker

## 2023-06-12 NOTE — Progress Notes (Signed)
 Pt refuses pneumococcal vaccine.

## 2023-06-12 NOTE — Assessment & Plan Note (Signed)
 Esophagitis.  CTA with no PE or dissection but did show severe circumferential wall thickening involving the entire esophagus, consistent with nonspecific infectious or inflammatory esophagitis. GI was consulted but they are not recommending any procedure or EGD due to concern of recent NSTEMI. -Continue with twice daily IV Protonix -Supportive care for nausea and vomiting

## 2023-06-12 NOTE — Consult Note (Signed)
 Pharmacy Consult Note - Anticoagulation  Pharmacy Consult for heparin Indication: chest pain/ACS  PATIENT MEASUREMENTS: Height: 5\' 2"  (157.5 cm) Weight: 43.1 kg (95 lb) IBW/kg (Calculated) : 50.1 HEPARIN DW (KG): 43.1  VITAL SIGNS: Temp: 98.9 F (37.2 C) (03/08 0029) Temp Source: Oral (03/08 0029) BP: 136/75 (03/08 0029) Pulse Rate: 70 (03/08 0029)  Recent Labs    06/11/23 0813 06/11/23 0853 06/11/23 1648 06/12/23 0135  HGB 17.5*  --   --   --   HCT 51.9*  --   --   --   PLT 471*  --   --   --   APTT 26  --   --   --   LABPROT 12.6  --   --   --   INR 0.9  --   --   --   HEPARINUNFRC  --    < > 0.16* 0.20*  CREATININE  --    < >  --  0.62  CKTOTAL 551*  --   --  907*  TROPONINIHS 2,960*   < > 3,478*  --    < > = values in this interval not displayed.    Estimated Creatinine Clearance: 57.2 mL/min (by C-G formula based on SCr of 0.62 mg/dL).  PAST MEDICAL HISTORY: Past Medical History:  Diagnosis Date   Alcohol abuse    Coronary artery disease 09/2021   s/p PCI to RCA in setting of unstable angina   History of tongue cancer    PAD (peripheral artery disease) (HCC)    Tobacco abuse     ASSESSMENT: 53 y.o. female with PMH including unstable angina, CAD s/p PCI (09/2021), HTN, tobacco use, CVA, superior mesenteric artery stenosis, PAD, PUD is presenting with chest pain and concerns for ACS. Chest pain started after episode of vomiting yesterday. Cardiac troponins trending up, currently at 3,286. Patient is not on chronic anticoagulation per chart review. Pharmacy has been consulted to initiate and manage heparin intravenous infusion.  Pertinent medications: Not on chronic anticoagulation prior to admission per chart review   Goal(s) of therapy: Heparin level 0.3 - 0.7 units/mL Monitor platelets by anticoagulation protocol: Yes   Baseline anticoagulation labs: Recent Labs    06/11/23 0813  APTT 26  INR 0.9  HGB 17.5*  PLT 471*     Date Time HL Rate/Comment 3/7 1648 0.16 Subtherapeutic@500  units/hr 3/8 0135 0.20 Subtherapeutic    PLAN: Give 600 units bolus x1 Increase heparin infusion to 750 units/hour. Recheck heparin level in 6 hours after rate change Monitor CBC daily while on heparin infusion.  Otelia Sergeant, PharmD, [facility] 06/12/2023 2:36 AM

## 2023-06-12 NOTE — Assessment & Plan Note (Signed)
 Mild and stable hyponatremia with sodium at 131. Likely due to alcohol abuse -Continue to monitor

## 2023-06-12 NOTE — Assessment & Plan Note (Signed)
Counseling was provided. -CIWA protocol 

## 2023-06-12 NOTE — Hospital Course (Addendum)
 Taken from H&P.  Sonya Harmon is a 53 y.o. female with medical history significant of CAD status post RCA stenting, HTN, PAD status post intervention, presented with worsening of epigastric pain, chest pain and persistent nausea with vomiting.   Patient was having intermittent epigastric pain for about 2 to 3 months,not associated with meal and no significant nocturnal pain.  Recently developed nausea associated with meals which decreased her p.o. intake and she has lost about 10 pounds in the last 2 months.  On presentation mild tachycardia, otherwise stable vital.  Labs with potassium of 2.7, sodium 131, leukocytosis at 22, hemoglobin 17.5, creatinine 1.3, bilirubin 2.6,  troponin 2900> 3200, CK5 51.  EKG showed no acute ST changes.  CTA chest, abdomen and pelvis with normal aorta, severe mixed aortic atherosclerosis.  Shows severe circumferential wall thickening involving the entirely of esophagus consistent with nonspecific infectious or inflammatory esophagitis.  She was started on heparin infusion and cardiology was consulted.  GI was consulted for concern of severe esophagitis and they are recommending PPI, Carafate and possible antifungal treatment if indicated.  No EGD in 6 weeks for the concern of NSTEMI.  3/8: Vital stable, worsening CK at 907-holding statin, rising troponin so we will continue to trend.  Improving leukocytosis at 15, all cell line decreased likely concentrated yesterday.  Potassium of 2.6 which were repleted.  Magnesium   UA with significant ketone and protein urea.  AKI resolved Troponin peaked at 3478 and now trending down.  Lactic acidosis resolved Echocardiogram with normal EF, no regional wall motion abnormalities and indeterminate diastolic parameter.  Patient has a known CAD s/p stenting in 2023.  Cardiology is recommending continue heparin for 48 hours and likely cath on Monday.  3/9: Hemodynamically stable, improving chest pain.  Going for cardiac cath tomorrow  morning.  Potassium remained low at 2.7 and is being repleted.  3/10: S/p cardiac catheterization which shows patent prior stents with stable proximal RCA stenosis.  No intervention was done. Persistent significant hypokalemia along with hypophosphatemia and borderline hypomagnesemia which are being repleted.  Nausea and vomiting improved.  No chest pain.  3/11: Patient remained hemodynamically stable.  Labs stable and electrolyte abnormalities were corrected. Patient was counseled again for smoking and alcohol cessation. She has Chantix at home which she will now resume. Patient should be on twice daily PPI for which she will use her home Prilosec twice daily instead of once for next 4 to 5 weeks.  She was also given Carafate for concern of esophagitis. Patient need to follow-up with her cardiologist and gastroenterologist.  She will continue on current medications and need to have a close follow-up with her providers for further assistance.

## 2023-06-13 DIAGNOSIS — R112 Nausea with vomiting, unspecified: Secondary | ICD-10-CM | POA: Diagnosis not present

## 2023-06-13 DIAGNOSIS — E876 Hypokalemia: Secondary | ICD-10-CM

## 2023-06-13 DIAGNOSIS — R9431 Abnormal electrocardiogram [ECG] [EKG]: Secondary | ICD-10-CM | POA: Diagnosis not present

## 2023-06-13 DIAGNOSIS — I214 Non-ST elevation (NSTEMI) myocardial infarction: Secondary | ICD-10-CM | POA: Diagnosis not present

## 2023-06-13 DIAGNOSIS — F1010 Alcohol abuse, uncomplicated: Secondary | ICD-10-CM | POA: Diagnosis not present

## 2023-06-13 DIAGNOSIS — E860 Dehydration: Secondary | ICD-10-CM

## 2023-06-13 DIAGNOSIS — E871 Hypo-osmolality and hyponatremia: Secondary | ICD-10-CM

## 2023-06-13 LAB — COMPREHENSIVE METABOLIC PANEL
ALT: 25 U/L (ref 0–44)
AST: 62 U/L — ABNORMAL HIGH (ref 15–41)
Albumin: 3.2 g/dL — ABNORMAL LOW (ref 3.5–5.0)
Alkaline Phosphatase: 39 U/L (ref 38–126)
Anion gap: 11 (ref 5–15)
BUN: 5 mg/dL — ABNORMAL LOW (ref 6–20)
CO2: 25 mmol/L (ref 22–32)
Calcium: 8.2 mg/dL — ABNORMAL LOW (ref 8.9–10.3)
Chloride: 95 mmol/L — ABNORMAL LOW (ref 98–111)
Creatinine, Ser: 0.52 mg/dL (ref 0.44–1.00)
GFR, Estimated: >60 mL/min (ref >60)
Glucose, Bld: 80 mg/dL (ref 70–99)
Potassium: 2.7 mmol/L — CL (ref 3.5–5.1)
Sodium: 131 mmol/L — ABNORMAL LOW (ref 135–145)
Total Bilirubin: 1.1 mg/dL (ref 0.0–1.2)
Total Protein: 5.7 g/dL — ABNORMAL LOW (ref 6.5–8.1)

## 2023-06-13 LAB — CBC
HCT: 32.5 % — ABNORMAL LOW (ref 36.0–46.0)
Hemoglobin: 11.7 g/dL — ABNORMAL LOW (ref 12.0–15.0)
MCH: 34.1 pg — ABNORMAL HIGH (ref 26.0–34.0)
MCHC: 36 g/dL (ref 30.0–36.0)
MCV: 94.8 fL (ref 80.0–100.0)
Platelets: 236 10*3/uL (ref 150–400)
RBC: 3.43 MIL/uL — ABNORMAL LOW (ref 3.87–5.11)
RDW: 13.9 % (ref 11.5–15.5)
WBC: 7.6 10*3/uL (ref 4.0–10.5)
nRBC: 0 % (ref 0.0–0.2)

## 2023-06-13 LAB — BASIC METABOLIC PANEL
Anion gap: 11 (ref 5–15)
BUN: 6 mg/dL (ref 6–20)
CO2: 22 mmol/L (ref 22–32)
Calcium: 8.2 mg/dL — ABNORMAL LOW (ref 8.9–10.3)
Chloride: 99 mmol/L (ref 98–111)
Creatinine, Ser: 0.56 mg/dL (ref 0.44–1.00)
GFR, Estimated: >60 mL/min (ref >60)
Glucose, Bld: 89 mg/dL (ref 70–99)
Potassium: 5.2 mmol/L — ABNORMAL HIGH (ref 3.5–5.1)
Sodium: 132 mmol/L — ABNORMAL LOW (ref 135–145)

## 2023-06-13 LAB — CK: Total CK: 819 U/L — ABNORMAL HIGH (ref 38–234)

## 2023-06-13 LAB — HEPARIN LEVEL (UNFRACTIONATED): Heparin Unfractionated: 0.35 [IU]/mL (ref 0.30–0.70)

## 2023-06-13 MED ORDER — POTASSIUM CHLORIDE 20 MEQ PO PACK
40.0000 meq | PACK | ORAL | Status: AC
  Administered 2023-06-13 (×2): 40 meq via ORAL
  Filled 2023-06-13 (×2): qty 2

## 2023-06-13 MED ORDER — POTASSIUM CHLORIDE 10 MEQ/100ML IV SOLN
10.0000 meq | INTRAVENOUS | Status: AC
  Administered 2023-06-13 (×4): 10 meq via INTRAVENOUS
  Filled 2023-06-13 (×4): qty 100

## 2023-06-13 MED ORDER — POTASSIUM CHLORIDE 20 MEQ PO PACK: 40.0000 meq | PACK | Freq: Once | ORAL | Status: DC

## 2023-06-13 MED ORDER — ATORVASTATIN CALCIUM 80 MG PO TABS
80.0000 mg | ORAL_TABLET | Freq: Every day | ORAL | Status: DC
  Administered 2023-06-13 – 2023-06-15 (×3): 80 mg via ORAL
  Filled 2023-06-13 (×3): qty 1

## 2023-06-13 MED ORDER — LACTATED RINGERS IV SOLN: INTRAVENOUS | Status: AC

## 2023-06-13 NOTE — Consult Note (Signed)
 Pharmacy Consult Note - Anticoagulation  Pharmacy Consult for heparin Indication: chest pain/ACS  PATIENT MEASUREMENTS: Height: 5\' 2"  (157.5 cm) Weight: 43.1 kg (95 lb) IBW/kg (Calculated) : 50.1 HEPARIN DW (KG): 43.1  VITAL SIGNS: Temp: 98.3 F (36.8 C) (03/08 2348) Temp Source: Oral (03/08 2348) BP: 136/88 (03/08 2348) Pulse Rate: 72 (03/08 2348)  Recent Labs    06/11/23 0813 06/11/23 9604 06/12/23 0135 06/12/23 0847 06/12/23 1252 06/12/23 1657 06/12/23 2304  HGB 17.5*  --  12.5  --   --   --   --   HCT 51.9*  --  34.9*  --   --   --   --   PLT 471*  --  301  --   --   --   --   APTT 26  --   --   --   --   --   --   LABPROT 12.6  --   --   --   --   --   --   INR 0.9  --   --   --   --   --   --   HEPARINUNFRC  --    < > 0.20*   < >  --    < > 0.40  CREATININE  --    < > 0.62  --   --   --   --   CKTOTAL 551*  --  907*  --   --   --   --   TROPONINIHS 2,960*   < >  --    < > 1,465*  --   --    < > = values in this interval not displayed.    Estimated Creatinine Clearance: 57.2 mL/min (by C-G formula based on SCr of 0.62 mg/dL).  PAST MEDICAL HISTORY: Past Medical History:  Diagnosis Date   Alcohol abuse    Coronary artery disease 09/2021   s/p PCI to RCA in setting of unstable angina   History of tongue cancer    PAD (peripheral artery disease) (HCC)    Tobacco abuse     ASSESSMENT: 53 y.o. female with PMH including unstable angina, CAD s/p PCI (09/2021), HTN, tobacco use, CVA, superior mesenteric artery stenosis, PAD, PUD is presenting with chest pain and concerns for ACS. Chest pain started after episode of vomiting yesterday. Cardiac troponins trending up, currently at 3,286. Patient is not on chronic anticoagulation per chart review. Pharmacy has been consulted to initiate and manage heparin intravenous infusion.  Pertinent medications: Not on chronic anticoagulation prior to admission per chart review   Goal(s) of therapy: Heparin level 0.3 - 0.7  units/mL Monitor platelets by anticoagulation protocol: Yes   Baseline anticoagulation labs: Recent Labs    06/11/23 0813 06/12/23 0135  APTT 26  --   INR 0.9  --   HGB 17.5* 12.5  PLT 471* 301    Date Time HL Rate/Comment 3/7 1648 0.16 Subtherapeutic@500  units/hr 3/8 0135 0.20 Subtherapeutic@650  units/hr 3/8 0847 0.12 Subtherapeutic@750  units/hr 3/8 1657 0.38 Therapeutic x 1@900  units/hr 3/8 2304 0.40 Therapeutic x 2    PLAN: HL therapeutic x 2 Continue heparin infusion at 900 units/hour. Recheck heparin level daily w/ AM labs while therapeutic Monitor CBC daily while on heparin infusion.  Otelia Sergeant, PharmD, MBA 06/13/2023 12:02 AM

## 2023-06-13 NOTE — H&P (View-Only) (Signed)
 Cardiology Progress Note   Patient Name: Sonya Harmon Date of Encounter: 06/13/2023  Primary Cardiologist: Julien Nordmann, MD  Subjective   Persistent nausea w/o vomiting this AM.  Throat feels very raw - hurts to swallow anything.  No angina/dyspnea.  Clarifies this AM that though she presented w/ n/v, that was preceded by severe left chest pressure radiating to her back assoc w/ dyspnea. Objective   Inpatient Medications    Scheduled Meds:  buPROPion  150 mg Oral BID   clopidogrel  75 mg Oral Daily   escitalopram  10 mg Oral Daily   folic acid  1 mg Oral Daily   gabapentin  300 mg Oral TID   metoprolol tartrate  25 mg Oral BID   multivitamin with minerals  1 tablet Oral Daily   pantoprazole (PROTONIX) IV  40 mg Intravenous Q12H   potassium chloride  40 mEq Oral Q4H   sucralfate  1 g Oral TID WC & HS   thiamine  100 mg Oral Daily   Or   thiamine  100 mg Intravenous Daily   varenicline  1 mg Oral BID   Continuous Infusions:  heparin 900 Units/hr (06/13/23 0533)   potassium chloride 10 mEq (06/13/23 0900)   promethazine (PHENERGAN) injection (IM or IVPB) Stopped (06/12/23 1012)   PRN Meds: LORazepam **OR** LORazepam, promethazine (PHENERGAN) injection (IM or IVPB)   Vital Signs    Vitals:   06/12/23 2031 06/12/23 2348 06/13/23 0336 06/13/23 0815  BP: (!) 141/76 136/88 (!) 142/85 (!) 153/85  Pulse: 93 72 64 66  Resp: 18 12 14 16   Temp: 98.1 F (36.7 C) 98.3 F (36.8 C) 98 F (36.7 C) 98.2 F (36.8 C)  TempSrc:  Oral Oral   SpO2: 97% 99% 98% 100%  Weight:      Height:        Intake/Output Summary (Last 24 hours) at 06/13/2023 1013 Last data filed at 06/13/2023 0533 Gross per 24 hour  Intake 476.76 ml  Output --  Net 476.76 ml   Filed Weights   06/11/23 0733  Weight: 43.1 kg    Physical Exam   GEN: Thin, in no acute distress.  HEENT: Grossly normal.  Neck: Supple, no JVD, carotid bruits, or masses. Cardiac: RRR, no murmurs, rubs, or gallops. No  clubbing, cyanosis, edema.  Radials 2+, DP/PT 2+ and equal bilaterally.  Respiratory:  Respirations regular and unlabored, clear to auscultation bilaterally. GI: Soft, nontender, nondistended, BS + x 4. MS: no deformity or atrophy. Skin: warm and dry, no rash. Neuro:  Strength and sensation are intact. Psych: AAOx3.  Normal affect.  Labs    Chemistry Recent Labs  Lab 06/11/23 346 104 2175 06/11/23 2012 06/12/23 0135 06/12/23 1041 06/13/23 0519  NA 131*  --  131*  --  131*  K 2.7*   < > 2.6* 3.2* 2.7*  CL 80*  --  95*  --  95*  CO2 19*  --  20*  --  25  GLUCOSE 157*  --  111*  --  80  BUN 10  --  6  --  5*  CREATININE 1.39*  --  0.62  --  0.52  CALCIUM 10.0  --  8.2*  --  8.2*  PROT 9.7*  --   --   --  5.7*  ALBUMIN 5.7*  --   --   --  3.2*  AST 83*  --   --   --  62*  ALT 37  --   --   --  25  ALKPHOS 74  --   --   --  39  BILITOT 2.6*  --   --   --  1.1  GFRNONAA 46*  --  >60  --  >60  ANIONGAP 32*  --  16*  --  11   < > = values in this interval not displayed.     Hematology Recent Labs  Lab 06/11/23 0813 06/12/23 0135 06/13/23 0519  WBC 22.6* 15.0* 7.6  RBC 5.11 3.65* 3.43*  HGB 17.5* 12.5 11.7*  HCT 51.9* 34.9* 32.5*  MCV 101.6* 95.6 94.8  MCH 34.2* 34.2* 34.1*  MCHC 33.7 35.8 36.0  RDW 14.5 14.1 13.9  PLT 471* 301 236    Cardiac Enzymes  Recent Labs  Lab 06/11/23 0813 06/11/23 0853 06/11/23 1648 06/12/23 1041 06/12/23 1252  TROPONINIHS 2,960* 3,286* 3,478* 2,006* 1,465*      BNP    Component Value Date/Time   BNP 30.4 09/07/2021 1125    Lipids  Lab Results  Component Value Date   CHOL 202 (H) 04/01/2023   HDL 101 04/01/2023   LDLCALC 91 04/01/2023   TRIG 54 04/01/2023   CHOLHDL 2.0 04/01/2023    Radiology    CT Angio Chest/Abd/Pel for Dissection W and/or W/WO Result Date: 06/11/2023 CLINICAL DATA:  Acute aortic syndrome suspected, chest pain, abdominal pain, nausea and vomiting EXAM: CT ANGIOGRAPHY CHEST, ABDOMEN AND PELVIS TECHNIQUE:  Non-contrast CT of the chest was initially obtained. Multidetector CT imaging through the chest, abdomen and pelvis was performed using the standard protocol during bolus administration of intravenous contrast. Multiplanar reconstructed images and MIPs were obtained and reviewed to evaluate the vascular anatomy. RADIATION DOSE REDUCTION: This exam was performed according to the departmental dose-optimization program which includes automated exposure control, adjustment of the mA and/or kV according to patient size and/or use of iterative reconstruction technique. CONTRAST:  75mL OMNIPAQUE IOHEXOL 350 MG/ML SOLN COMPARISON:  CT chest, 05/25/2023, CT abdomen pelvis, 02/21/2023 FINDINGS: CTA CHEST FINDINGS VASCULAR Aorta: Satisfactory opacification of the aorta. Normal caliber of the thoracic aorta. Severe mixed aortic atherosclerosis with a large burden of irregular mural thrombus, predominantly involving the descending thoracic aorta. No evidence of aneurysm, dissection, or other acute aortic pathology. Cardiovascular: No evidence of pulmonary embolism on limited non-tailored examination. Normal heart size. Left and right coronary artery calcifications and stents. No pericardial effusion. Review of the MIP images confirms the above findings. NON VASCULAR Mediastinum/Nodes: No enlarged mediastinal, hilar, or axillary lymph nodes. Severe circumferential wall thickening involving the entirety of the esophagus (series 6, image 104). Thyroid and trachea demonstrate no significant findings. Lungs/Pleura: Minimal centrilobular and paraseptal emphysema. Background of fine centrilobular nodularity throughout the lungs. No pleural effusion or pneumothorax. Musculoskeletal: No chest wall abnormality. No acute osseous findings. Review of the MIP images confirms the above findings. CTA ABDOMEN AND PELVIS FINDINGS VASCULAR Normal contour and caliber of the abdominal aorta. No evidence of aneurysm, dissection, or other acute aortic  pathology. Severe mixed calcific atherosclerosis. Vascular variant direct origin of the hepatic artery from the aorta with otherwise standard branching pattern of the abdominal aorta. Review of the MIP images confirms the above findings. NON-VASCULAR Hepatobiliary: No solid liver abnormality is seen. Hepatic steatosis no gallstones, gallbladder wall thickening, or biliary dilatation. Pancreas: Unremarkable. No pancreatic ductal dilatation or surrounding inflammatory changes. Spleen: Normal in size without significant abnormality. Adrenals/Urinary Tract: Adrenal glands are unremarkable. Kidneys are normal, without renal calculi, solid lesion, or hydronephrosis. Bladder is unremarkable. Stomach/Bowel: Stomach is within  normal limits. Appendix appears normal. No evidence of bowel wall thickening, distention, or inflammatory changes. Lymphatic: No enlarged abdominal or pelvic lymph nodes. Reproductive: No mass or other significant abnormality. Other: No abdominal wall hernia or abnormality. No ascites. Musculoskeletal: No acute osseous findings. IMPRESSION: 1. Normal caliber of the thoracic and abdominal aorta. Severe mixed aortic atherosclerosis, significantly advanced for patient age. No evidence of aneurysm, dissection, or other acute aortic pathology. 2. Severe circumferential wall thickening involving the entirety of the esophagus, consistent with nonspecific infectious or inflammatory esophagitis. 3. Minimal emphysema and smoking-related respiratory bronchiolitis. 4. Coronary artery disease. 5. Hepatic steatosis. Aortic Atherosclerosis (ICD10-I70.0) and Emphysema (ICD10-J43.9). Electronically Signed   By: Jearld Lesch M.D.   On: 06/11/2023 10:38   DG Chest 2 View Result Date: 06/11/2023 CLINICAL DATA:  Chest pain. EXAM: CHEST - 2 VIEW COMPARISON:  February 20, 2023. FINDINGS: The heart size and mediastinal contours are within normal limits. Both lungs are clear. The visualized skeletal structures are  unremarkable. IMPRESSION: No active cardiopulmonary disease. Electronically Signed   By: Lupita Raider M.D.   On: 06/11/2023 10:28     Telemetry    RSR, 60's - Personally Reviewed  Cardiac Studies   Cardiac Catheterization and Percutaneous Coronary Intervention 6.2023    *STENT ONYX FRONTIER [id number] _____________  2D Echocardiogram 3.8.2025   1. Left ventricular ejection fraction, by estimation, is 60 to 65%. The  left ventricle has normal function. The left ventricle has no regional  wall motion abnormalities. Left ventricular diastolic parameters are  indeterminate. The average left  ventricular global longitudinal strain is -21.2 %. The global longitudinal  strain is normal.   2. Right ventricular systolic function is normal. The right ventricular  size is normal. Tricuspid regurgitation signal is inadequate for assessing  PA pressure.   3. The mitral valve is normal in structure. Moderate mitral valve  regurgitation. No evidence of mitral stenosis.   4. Tricuspid valve regurgitation is mild to moderate.   5. The aortic valve is tricuspid. Aortic valve regurgitation is not  visualized. No aortic stenosis is present.   6. The inferior vena cava is normal in size with greater than 50%  respiratory variability, suggesting right atrial pressure of 3 mmHg.  _____________   Patient Profile     53 y.o. female w/ a h/o CAD s/p RCA stenting in 2023, LAD bridging, PAD, tob abuse, alcohol abuse, HL, and cancer of the tongue, who was admitted 3/7 w/ chest pain, n, vomiting, and NSTEMI.  Assessment & Plan    1.  NSTEMI/CAD:  s/p PCI/DES to the RCA in 09/2021.  Developed severe c/p w/ dyspnea, n/v prior to presentation.  ECG on presentation w/ inflat ST depression.  hsTrop peaked @ 3478.  Echo w/ nl LV fxn and w/o rwma.  Pt feels presenting symptoms were similar to prior angina.  Though she's cont to have nausea (no vomiting this AM), she has not had any recurrent c/p or dyspnea.   Discussed options for mgmt.  The patient understands that risks include but are not limited to stroke (1 in 1000), death (1 in 1000), kidney failure [usually temporary] (1 in 500), bleeding (1 in 200), allergic reaction [possibly serious] (1 in 200), and agrees to proceed.  Cont clopidogrel, ? blocker, and heparin.  Resume statin.  Plan cath tomorrow.  2.  N/V/Esophagitis:  Nausea improved but persists.  Not much appetite.  No vomiting this AM.  Cont antiemetics and PPI.  Watch QTc (450 this  AM).  3.  Primary HTN:  Pressures variable yesterday - 117 - 144.  Trending higher this AM - 140's - 150's.  Follow up after AM meds and consider addition of ARB.  4.  HL:  On atorvastatin @ home.  Held on admission.  Chronic mild AST elevation w/ nl ALT.  Resume atorva 80.  5.  Hypokalemia:  2.7 this AM  IV supplementation ordered.  6.  ETOH abuse:  CIWA protocol.  7.  AKI:  resolved.  8.  Hyponatremia:  stable.  Signed, Nicolasa Ducking, NP  06/13/2023, 10:13 AM    For questions or updates, please contact   Please consult [email or web address] for contact info under Cardiology/STEMI.

## 2023-06-13 NOTE — Progress Notes (Signed)
 Progress Note   Patient: Sonya Harmon:[id number] DOB: 1971-01-03 DOA: 06/11/2023     2 DOS: the patient was seen and examined on 06/13/2023   Brief hospital course: Taken from H&P.  ALNITA AYBAR is a 53 y.o. female with medical history significant of CAD status post RCA stenting, HTN, PAD status post intervention, presented with worsening of epigastric pain, chest pain and persistent nausea with vomiting.   Patient was having intermittent epigastric pain for about 2 to 3 months,not associated with meal and no significant nocturnal pain.  Recently developed nausea associated with meals which decreased her p.o. intake and she has lost about 10 pounds in the last 2 months.  On presentation mild tachycardia, otherwise stable vital.  Labs with potassium of 2.7, sodium 131, leukocytosis at 22, hemoglobin 17.5, creatinine 1.3, bilirubin 2.6,  troponin 2900> 3200, CK5 51.  EKG showed no acute ST changes.  CTA chest, abdomen and pelvis with normal aorta, severe mixed aortic atherosclerosis.  Shows severe circumferential wall thickening involving the entirely of esophagus consistent with nonspecific infectious or inflammatory esophagitis.  She was started on heparin infusion and cardiology was consulted.  GI was consulted for concern of severe esophagitis and they are recommending PPI, Carafate and possible antifungal treatment if indicated.  No EGD in 6 weeks for the concern of NSTEMI.  3/8: Vital stable, worsening CK at 907-holding statin, rising troponin so we will continue to trend.  Improving leukocytosis at 15, all cell line decreased likely concentrated yesterday.  Potassium of 2.6 which were repleted.  Magnesium   UA with significant ketone and protein urea.  AKI resolved Troponin peaked at 3478 and now trending down.  Lactic acidosis resolved Echocardiogram with normal EF, no regional wall motion abnormalities and indeterminate diastolic parameter.  Patient has a known CAD s/p stenting in  2023.  Cardiology is recommending continue heparin for 48 hours and likely cath on Monday.  3/9: Hemodynamically stable, improving chest pain.  Going for cardiac cath tomorrow morning.  Potassium remained low at 2.7 and is being repleted.   Assessment and Plan: * NSTEMI (non-ST elevated myocardial infarction) [facility]) Patient with nonspecific chest pain, associated nausea and vomiting.  Known history of significant CAD s/p PCI in 2023.  Troponin peaked at 3400 and now trending down. Cardiology is on board -Continue heparin infusion  -Cardiac cath tomorrow -Continue statin, Plavix and beta-blocker  Nausea and vomiting Esophagitis.  CTA with no PE or dissection but did show severe circumferential wall thickening involving the entire esophagus, consistent with nonspecific infectious or inflammatory esophagitis. GI was consulted but they are not recommending any procedure or EGD due to concern of recent NSTEMI. Nausea and vomiting improved -Continue with twice daily IV Protonix -Supportive care for nausea and vomiting  Severe dehydration As evident by hemoconcentration and AKI. AKI has been resolved -Continue with IV fluid for another day  Hypokalemia Persistent hypokalemia likely due to GI losses with ongoing  Magnesium normal -Replete electrolyte and monitor  Hyponatremia Mild and stable hyponatremia with sodium at 131. Likely due to alcohol abuse -Continue to monitor  Rhabdomyolysis Worsening CK 551>>907>>819 -Holding statin -Giving some IV fluid -Continue to monitor  Alcohol abuse Counseling was provided. -CIWA protocol   Subjective: Patient was seen and examined today.  No current chest pain, per patient she did had some chest pain earlier in the morning.  Nausea and vomiting improving  Physical Exam: Vitals:   06/12/23 2348 06/13/23 0336 06/13/23 0815 06/13/23 1206  BP: 136/88 Marland Kitchen)  142/85 (!) 153/85 109/67  Pulse: 72 64 66 62  Resp: 12 14 16 16   Temp: 98.3 F (36.8  C) 98 F (36.7 C) 98.2 F (36.8 C) 98.2 F (36.8 C)  TempSrc: Oral Oral    SpO2: 99% 98% 100% 98%  Weight:      Height:       General.  Malnourished lady, in no acute distress. Pulmonary.  Lungs clear bilaterally, normal respiratory effort. CV.  Regular rate and rhythm, no JVD, rub or murmur. Abdomen.  Soft, nontender, nondistended, BS positive. CNS.  Alert and oriented .  No focal neurologic deficit. Extremities.  No edema, no cyanosis, pulses intact and symmetrical. Psychiatry.  Judgment and insight appears normal.    Data Reviewed: Prior data reviewed  Family Communication: Discussed with patient  Disposition: Status is: Inpatient Remains inpatient appropriate because: Severity of illness  Planned Discharge Destination: Home  DVT prophylaxis.  Heparin infusion Time spent: 50 minutes  This record has been created using Conservation officer, historic buildings. Errors have been sought and corrected,but may not always be located. Such creation errors do not reflect on the standard of care.   Author: Arnetha Courser, MD 06/13/2023 3:16 PM  For on call review [email or web address].

## 2023-06-13 NOTE — Plan of Care (Signed)
  Problem: Clinical Measurements: Goal: Will remain free from infection Outcome: Progressing Goal: Diagnostic test results will improve Outcome: Progressing Goal: Cardiovascular complication will be avoided Outcome: Progressing   Problem: Nutrition: Goal: Adequate nutrition will be maintained Outcome: Progressing   Problem: Pain Managment: Goal: General experience of comfort will improve and/or be controlled Outcome: Progressing   Problem: Safety: Goal: Ability to remain free from injury will improve Outcome: Progressing   Problem: Skin Integrity: Goal: Risk for impaired skin integrity will decrease Outcome: Progressing

## 2023-06-13 NOTE — Assessment & Plan Note (Signed)
 Persistent hypokalemia with mild hypophosphatemia and hypomagnesemia today -Replete electrolytes and monitor

## 2023-06-13 NOTE — Assessment & Plan Note (Signed)
 Esophagitis.  CTA with no PE or dissection but did show severe circumferential wall thickening involving the entire esophagus, consistent with nonspecific infectious or inflammatory esophagitis. GI was consulted but they are not recommending any procedure or EGD due to concern of recent NSTEMI. Nausea and vomiting improved -Continue with twice daily IV Protonix -Supportive care for nausea and vomiting

## 2023-06-13 NOTE — Progress Notes (Signed)
 Date and time results received: 06/13/23 0630 (use smartphrase ".now" to insert current time)  Test: Potassium  Critical Value: 2.7  Name of Provider Notified: Manuela Schwartz  Orders Received? Or Actions Taken?:  No new orders, dayshift provider to be notified

## 2023-06-13 NOTE — Consult Note (Signed)
 PHARMACY CONSULT NOTE - ELECTROLYTES  Pharmacy Consult for Electrolyte Monitoring and Replacement   Recent Labs: Height: 5\' 2"  (157.5 cm) Weight: 43.1 kg (95 lb) IBW/kg (Calculated) : 50.1 Estimated Creatinine Clearance: 57.2 mL/min (by C-G formula based on SCr of 0.52 mg/dL).  Potassium (mmol/L)  Date Value  06/13/2023 2.7 (LL)   Magnesium (mg/dL)  Date Value  78/29/5621 2.1   Calcium (mg/dL)  Date Value  30/86/5784 8.2 (L)   Albumin (g/dL)  Date Value  69/62/9528 3.2 (L)  04/01/2023 4.7   Sodium (mmol/L)  Date Value  06/13/2023 131 (L)  12/22/2022 137   Assessment  Sonya Harmon is a 53 y.o. female presenting with nausea/vomiting/abdominal pain. PMH significant for HTN, HLD, and CAD. Pharmacy has been consulted to monitor and replace electrolytes.  Diet: PO MIVF: NS @ 100 mL/hr  Goal of Therapy: Electrolytes WNL  Plan:  K 2.7: Kcl IV x 4, Kcl PO x 1 Recheck K in the afternoon after supplementation given Check BMP, Mag, Phos with AM labs   Thank you for allowing pharmacy to be a part of this patient's care.  Bettey Costa, PharmD Pharmacy Resident  06/13/2023 7:51 AM

## 2023-06-13 NOTE — Assessment & Plan Note (Signed)
 Patient with nonspecific chest pain, associated nausea and vomiting.  Known history of significant CAD s/p PCI in 2023.  Troponin peaked at 3400 and now trending down. Cardiology is on board, s/p cardiac catheterization with patent prior stents and stable prior stenosis, no culprit found and no further intervention done -Continue statin, Plavix and beta-blocker

## 2023-06-13 NOTE — Assessment & Plan Note (Signed)
 Worsening CK 551>>907>>819 -Holding statin -Giving some IV fluid -Continue to monitor

## 2023-06-13 NOTE — Consult Note (Signed)
 Pharmacy Consult Note - Anticoagulation  Pharmacy Consult for heparin Indication: chest pain/ACS  PATIENT MEASUREMENTS: Height: 5\' 2"  (157.5 cm) Weight: 43.1 kg (95 lb) IBW/kg (Calculated) : 50.1 HEPARIN DW (KG): 43.1  VITAL SIGNS: Temp: 98 F (36.7 C) (03/09 0336) Temp Source: Oral (03/09 0336) BP: 142/85 (03/09 0336) Pulse Rate: 64 (03/09 0336)  Recent Labs    06/11/23 0813 06/11/23 0853 06/12/23 1252 06/12/23 1657 06/13/23 0519  HGB 17.5*   < >  --   --  11.7*  HCT 51.9*   < >  --   --  32.5*  PLT 471*   < >  --   --  236  APTT 26  --   --   --   --   LABPROT 12.6  --   --   --   --   INR 0.9  --   --   --   --   HEPARINUNFRC  --    < >  --    < > 0.35  CREATININE  --    < >  --   --  0.52  CKTOTAL 551*   < >  --   --  819*  TROPONINIHS 2,960*   < > 1,465*  --   --    < > = values in this interval not displayed.    Estimated Creatinine Clearance: 57.2 mL/min (by C-G formula based on SCr of 0.52 mg/dL).  PAST MEDICAL HISTORY: Past Medical History:  Diagnosis Date   Alcohol abuse    Coronary artery disease 09/2021   s/p PCI to RCA in setting of unstable angina   History of tongue cancer    PAD (peripheral artery disease) (HCC)    Tobacco abuse     ASSESSMENT: 53 y.o. female with PMH including unstable angina, CAD s/p PCI (09/2021), HTN, tobacco use, CVA, superior mesenteric artery stenosis, PAD, PUD is presenting with chest pain and concerns for ACS. Chest pain started after episode of vomiting yesterday. Cardiac troponins trending up, currently at 3,286. Patient is not on chronic anticoagulation per chart review. Pharmacy has been consulted to initiate and manage heparin intravenous infusion.  Pertinent medications: Not on chronic anticoagulation prior to admission per chart review   Goal(s) of therapy: Heparin level 0.3 - 0.7 units/mL Monitor platelets by anticoagulation protocol: Yes   Baseline anticoagulation labs: Recent Labs    06/11/23 0813  06/12/23 0135 06/13/23 0519  APTT 26  --   --   INR 0.9  --   --   HGB 17.5* 12.5 11.7*  PLT 471* 301 236    Date Time HL Rate/Comment 3/7 1648 0.16 Subtherapeutic@500  units/hr 3/8 0135 0.20 Subtherapeutic@650  units/hr 3/8 0847 0.12 Subtherapeutic@750  units/hr 3/8 1657 0.38 Therapeutic x 1@900  units/hr 3/8 2304 0.40 Therapeutic x 2 3/9 0519 0.35 Therapeutic x 3    PLAN: HL therapeutic x 3 Continue heparin infusion at 900 units/hour. Recheck heparin level daily w/ AM labs while therapeutic Monitor CBC daily while on heparin infusion.  Otelia Sergeant, PharmD, [facility] 06/13/2023 6:22 AM

## 2023-06-13 NOTE — Plan of Care (Signed)

## 2023-06-13 NOTE — Assessment & Plan Note (Signed)
 Slowly improving hyponatremia with sodium at 134 today Likely due to alcohol abuse -Continue to monitor

## 2023-06-13 NOTE — Discharge Instructions (Signed)

## 2023-06-13 NOTE — Progress Notes (Signed)
 Cardiology Progress Note   Patient Name: Sonya Harmon Date of Encounter: 06/13/2023  Primary Cardiologist: Julien Nordmann, MD  Subjective   Persistent nausea w/o vomiting this AM.  Throat feels very raw - hurts to swallow anything.  No angina/dyspnea.  Clarifies this AM that though she presented w/ n/v, that was preceded by severe left chest pressure radiating to her back assoc w/ dyspnea. Objective   Inpatient Medications    Scheduled Meds:  buPROPion  150 mg Oral BID   clopidogrel  75 mg Oral Daily   escitalopram  10 mg Oral Daily   folic acid  1 mg Oral Daily   gabapentin  300 mg Oral TID   metoprolol tartrate  25 mg Oral BID   multivitamin with minerals  1 tablet Oral Daily   pantoprazole (PROTONIX) IV  40 mg Intravenous Q12H   potassium chloride  40 mEq Oral Q4H   sucralfate  1 g Oral TID WC & HS   thiamine  100 mg Oral Daily   Or   thiamine  100 mg Intravenous Daily   varenicline  1 mg Oral BID   Continuous Infusions:  heparin 900 Units/hr (06/13/23 0533)   potassium chloride 10 mEq (06/13/23 0900)   promethazine (PHENERGAN) injection (IM or IVPB) Stopped (06/12/23 1012)   PRN Meds: LORazepam **OR** LORazepam, promethazine (PHENERGAN) injection (IM or IVPB)   Vital Signs    Vitals:   06/12/23 2031 06/12/23 2348 06/13/23 0336 06/13/23 0815  BP: (!) 141/76 136/88 (!) 142/85 (!) 153/85  Pulse: 93 72 64 66  Resp: 18 12 14 16   Temp: 98.1 F (36.7 C) 98.3 F (36.8 C) 98 F (36.7 C) 98.2 F (36.8 C)  TempSrc:  Oral Oral   SpO2: 97% 99% 98% 100%  Weight:      Height:        Intake/Output Summary (Last 24 hours) at 06/13/2023 1013 Last data filed at 06/13/2023 0533 Gross per 24 hour  Intake 476.76 ml  Output --  Net 476.76 ml   Filed Weights   06/11/23 0733  Weight: 43.1 kg    Physical Exam   GEN: Thin, in no acute distress.  HEENT: Grossly normal.  Neck: Supple, no JVD, carotid bruits, or masses. Cardiac: RRR, no murmurs, rubs, or gallops. No  clubbing, cyanosis, edema.  Radials 2+, DP/PT 2+ and equal bilaterally.  Respiratory:  Respirations regular and unlabored, clear to auscultation bilaterally. GI: Soft, nontender, nondistended, BS + x 4. MS: no deformity or atrophy. Skin: warm and dry, no rash. Neuro:  Strength and sensation are intact. Psych: AAOx3.  Normal affect.  Labs    Chemistry Recent Labs  Lab 06/11/23 346 104 2175 06/11/23 2012 06/12/23 0135 06/12/23 1041 06/13/23 0519  NA 131*  --  131*  --  131*  K 2.7*   < > 2.6* 3.2* 2.7*  CL 80*  --  95*  --  95*  CO2 19*  --  20*  --  25  GLUCOSE 157*  --  111*  --  80  BUN 10  --  6  --  5*  CREATININE 1.39*  --  0.62  --  0.52  CALCIUM 10.0  --  8.2*  --  8.2*  PROT 9.7*  --   --   --  5.7*  ALBUMIN 5.7*  --   --   --  3.2*  AST 83*  --   --   --  62*  ALT 37  --   --   --  25  ALKPHOS 74  --   --   --  39  BILITOT 2.6*  --   --   --  1.1  GFRNONAA 46*  --  >60  --  >60  ANIONGAP 32*  --  16*  --  11   < > = values in this interval not displayed.     Hematology Recent Labs  Lab 06/11/23 0813 06/12/23 0135 06/13/23 0519  WBC 22.6* 15.0* 7.6  RBC 5.11 3.65* 3.43*  HGB 17.5* 12.5 11.7*  HCT 51.9* 34.9* 32.5*  MCV 101.6* 95.6 94.8  MCH 34.2* 34.2* 34.1*  MCHC 33.7 35.8 36.0  RDW 14.5 14.1 13.9  PLT 471* 301 236    Cardiac Enzymes  Recent Labs  Lab 06/11/23 0813 06/11/23 0853 06/11/23 1648 06/12/23 1041 06/12/23 1252  TROPONINIHS 2,960* 3,286* 3,478* 2,006* 1,465*      BNP    Component Value Date/Time   BNP 30.4 09/07/2021 1125    Lipids  Lab Results  Component Value Date   CHOL 202 (H) 04/01/2023   HDL 101 04/01/2023   LDLCALC 91 04/01/2023   TRIG 54 04/01/2023   CHOLHDL 2.0 04/01/2023    Radiology    CT Angio Chest/Abd/Pel for Dissection W and/or W/WO Result Date: 06/11/2023 CLINICAL DATA:  Acute aortic syndrome suspected, chest pain, abdominal pain, nausea and vomiting EXAM: CT ANGIOGRAPHY CHEST, ABDOMEN AND PELVIS TECHNIQUE:  Non-contrast CT of the chest was initially obtained. Multidetector CT imaging through the chest, abdomen and pelvis was performed using the standard protocol during bolus administration of intravenous contrast. Multiplanar reconstructed images and MIPs were obtained and reviewed to evaluate the vascular anatomy. RADIATION DOSE REDUCTION: This exam was performed according to the departmental dose-optimization program which includes automated exposure control, adjustment of the mA and/or kV according to patient size and/or use of iterative reconstruction technique. CONTRAST:  75mL OMNIPAQUE IOHEXOL 350 MG/ML SOLN COMPARISON:  CT chest, 05/25/2023, CT abdomen pelvis, 02/21/2023 FINDINGS: CTA CHEST FINDINGS VASCULAR Aorta: Satisfactory opacification of the aorta. Normal caliber of the thoracic aorta. Severe mixed aortic atherosclerosis with a large burden of irregular mural thrombus, predominantly involving the descending thoracic aorta. No evidence of aneurysm, dissection, or other acute aortic pathology. Cardiovascular: No evidence of pulmonary embolism on limited non-tailored examination. Normal heart size. Left and right coronary artery calcifications and stents. No pericardial effusion. Review of the MIP images confirms the above findings. NON VASCULAR Mediastinum/Nodes: No enlarged mediastinal, hilar, or axillary lymph nodes. Severe circumferential wall thickening involving the entirety of the esophagus (series 6, image 104). Thyroid and trachea demonstrate no significant findings. Lungs/Pleura: Minimal centrilobular and paraseptal emphysema. Background of fine centrilobular nodularity throughout the lungs. No pleural effusion or pneumothorax. Musculoskeletal: No chest wall abnormality. No acute osseous findings. Review of the MIP images confirms the above findings. CTA ABDOMEN AND PELVIS FINDINGS VASCULAR Normal contour and caliber of the abdominal aorta. No evidence of aneurysm, dissection, or other acute aortic  pathology. Severe mixed calcific atherosclerosis. Vascular variant direct origin of the hepatic artery from the aorta with otherwise standard branching pattern of the abdominal aorta. Review of the MIP images confirms the above findings. NON-VASCULAR Hepatobiliary: No solid liver abnormality is seen. Hepatic steatosis no gallstones, gallbladder wall thickening, or biliary dilatation. Pancreas: Unremarkable. No pancreatic ductal dilatation or surrounding inflammatory changes. Spleen: Normal in size without significant abnormality. Adrenals/Urinary Tract: Adrenal glands are unremarkable. Kidneys are normal, without renal calculi, solid lesion, or hydronephrosis. Bladder is unremarkable. Stomach/Bowel: Stomach is within  normal limits. Appendix appears normal. No evidence of bowel wall thickening, distention, or inflammatory changes. Lymphatic: No enlarged abdominal or pelvic lymph nodes. Reproductive: No mass or other significant abnormality. Other: No abdominal wall hernia or abnormality. No ascites. Musculoskeletal: No acute osseous findings. IMPRESSION: 1. Normal caliber of the thoracic and abdominal aorta. Severe mixed aortic atherosclerosis, significantly advanced for patient age. No evidence of aneurysm, dissection, or other acute aortic pathology. 2. Severe circumferential wall thickening involving the entirety of the esophagus, consistent with nonspecific infectious or inflammatory esophagitis. 3. Minimal emphysema and smoking-related respiratory bronchiolitis. 4. Coronary artery disease. 5. Hepatic steatosis. Aortic Atherosclerosis (ICD10-I70.0) and Emphysema (ICD10-J43.9). Electronically Signed   By: Jearld Lesch M.D.   On: 06/11/2023 10:38   DG Chest 2 View Result Date: 06/11/2023 CLINICAL DATA:  Chest pain. EXAM: CHEST - 2 VIEW COMPARISON:  February 20, 2023. FINDINGS: The heart size and mediastinal contours are within normal limits. Both lungs are clear. The visualized skeletal structures are  unremarkable. IMPRESSION: No active cardiopulmonary disease. Electronically Signed   By: Lupita Raider M.D.   On: 06/11/2023 10:28     Telemetry    RSR, 60's - Personally Reviewed  Cardiac Studies   Cardiac Catheterization and Percutaneous Coronary Intervention 6.2023    *STENT ONYX FRONTIER 2.75X38 _____________  2D Echocardiogram 3.8.2025   1. Left ventricular ejection fraction, by estimation, is 60 to 65%. The  left ventricle has normal function. The left ventricle has no regional  wall motion abnormalities. Left ventricular diastolic parameters are  indeterminate. The average left  ventricular global longitudinal strain is -21.2 %. The global longitudinal  strain is normal.   2. Right ventricular systolic function is normal. The right ventricular  size is normal. Tricuspid regurgitation signal is inadequate for assessing  PA pressure.   3. The mitral valve is normal in structure. Moderate mitral valve  regurgitation. No evidence of mitral stenosis.   4. Tricuspid valve regurgitation is mild to moderate.   5. The aortic valve is tricuspid. Aortic valve regurgitation is not  visualized. No aortic stenosis is present.   6. The inferior vena cava is normal in size with greater than 50%  respiratory variability, suggesting right atrial pressure of 3 mmHg.  _____________   Patient Profile     53 y.o. female w/ a h/o CAD s/p RCA stenting in 2023, LAD bridging, PAD, tob abuse, alcohol abuse, HL, and cancer of the tongue, who was admitted 3/7 w/ chest pain, n, vomiting, and NSTEMI.  Assessment & Plan    1.  NSTEMI/CAD:  s/p PCI/DES to the RCA in 09/2021.  Developed severe c/p w/ dyspnea, n/v prior to presentation.  ECG on presentation w/ inflat ST depression.  hsTrop peaked @ 3478.  Echo w/ nl LV fxn and w/o rwma.  Pt feels presenting symptoms were similar to prior angina.  Though she's cont to have nausea (no vomiting this AM), she has not had any recurrent c/p or dyspnea.   Discussed options for mgmt.  The patient understands that risks include but are not limited to stroke (1 in 1000), death (1 in 1000), kidney failure [usually temporary] (1 in 500), bleeding (1 in 200), allergic reaction [possibly serious] (1 in 200), and agrees to proceed.  Cont clopidogrel, ? blocker, and heparin.  Resume statin.  Plan cath tomorrow.  2.  N/V/Esophagitis:  Nausea improved but persists.  Not much appetite.  No vomiting this AM.  Cont antiemetics and PPI.  Watch QTc (450 this  AM).  3.  Primary HTN:  Pressures variable yesterday - 117 - 144.  Trending higher this AM - 140's - 150's.  Follow up after AM meds and consider addition of ARB.  4.  HL:  On atorvastatin @ home.  Held on admission.  Chronic mild AST elevation w/ nl ALT.  Resume atorva 80.  5.  Hypokalemia:  2.7 this AM  IV supplementation ordered.  6.  ETOH abuse:  CIWA protocol.  7.  AKI:  resolved.  8.  Hyponatremia:  stable.  Signed, Nicolasa Ducking, NP  06/13/2023, 10:13 AM    For questions or updates, please contact   Please consult [email or web address] for contact info under Cardiology/STEMI.

## 2023-06-14 ENCOUNTER — Telehealth (HOSPITAL_COMMUNITY): Payer: Self-pay | Admitting: Pharmacy Technician

## 2023-06-14 ENCOUNTER — Other Ambulatory Visit (HOSPITAL_COMMUNITY): Payer: Self-pay

## 2023-06-14 ENCOUNTER — Encounter
Admission: EM | Disposition: A | Discharge status: Home / Self Care | Payer: Self-pay | Source: Home / Self Care | Attending: Internal Medicine

## 2023-06-14 ENCOUNTER — Encounter: Payer: Self-pay | Admitting: Cardiovascular Disease

## 2023-06-14 DIAGNOSIS — I10 Essential (primary) hypertension: Secondary | ICD-10-CM

## 2023-06-14 DIAGNOSIS — R112 Nausea with vomiting, unspecified: Secondary | ICD-10-CM | POA: Diagnosis not present

## 2023-06-14 DIAGNOSIS — F1010 Alcohol abuse, uncomplicated: Secondary | ICD-10-CM | POA: Diagnosis not present

## 2023-06-14 DIAGNOSIS — R9431 Abnormal electrocardiogram [ECG] [EKG]: Secondary | ICD-10-CM | POA: Diagnosis not present

## 2023-06-14 DIAGNOSIS — I2510 Atherosclerotic heart disease of native coronary artery without angina pectoris: Secondary | ICD-10-CM

## 2023-06-14 DIAGNOSIS — I214 Non-ST elevation (NSTEMI) myocardial infarction: Secondary | ICD-10-CM | POA: Diagnosis not present

## 2023-06-14 HISTORY — PX: LEFT HEART CATH AND CORONARY ANGIOGRAPHY: CATH118249

## 2023-06-14 LAB — BASIC METABOLIC PANEL
Anion gap: 11 (ref 5–15)
BUN: 6 mg/dL (ref 6–20)
CO2: 25 mmol/L (ref 22–32)
Calcium: 8.4 mg/dL — ABNORMAL LOW (ref 8.9–10.3)
Chloride: 98 mmol/L (ref 98–111)
Creatinine, Ser: 0.56 mg/dL (ref 0.44–1.00)
GFR, Estimated: >60 mL/min (ref >60)
Glucose, Bld: 85 mg/dL (ref 70–99)
Potassium: 2.8 mmol/L — ABNORMAL LOW (ref 3.5–5.1)
Sodium: 134 mmol/L — ABNORMAL LOW (ref 135–145)

## 2023-06-14 LAB — PHOSPHORUS: Phosphorus: 1.8 mg/dL — ABNORMAL LOW (ref 2.5–4.6)

## 2023-06-14 LAB — CBC
HCT: 33.4 % — ABNORMAL LOW (ref 36.0–46.0)
Hemoglobin: 11.7 g/dL — ABNORMAL LOW (ref 12.0–15.0)
MCH: 34.1 pg — ABNORMAL HIGH (ref 26.0–34.0)
MCHC: 35 g/dL (ref 30.0–36.0)
MCV: 97.4 fL (ref 80.0–100.0)
Platelets: 217 10*3/uL (ref 150–400)
RBC: 3.43 MIL/uL — ABNORMAL LOW (ref 3.87–5.11)
RDW: 13.9 % (ref 11.5–15.5)
WBC: 7 10*3/uL (ref 4.0–10.5)
nRBC: 0 % (ref 0.0–0.2)

## 2023-06-14 LAB — MAGNESIUM: Magnesium: 1.8 mg/dL (ref 1.7–2.4)

## 2023-06-14 LAB — HEPARIN LEVEL (UNFRACTIONATED): Heparin Unfractionated: 0.31 [IU]/mL (ref 0.30–0.70)

## 2023-06-14 SURGERY — LEFT HEART CATH AND CORONARY ANGIOGRAPHY
Anesthesia: Moderate Sedation

## 2023-06-14 MED ORDER — HEPARIN (PORCINE) IN NACL 1000-0.9 UT/500ML-% IV SOLN
INTRAVENOUS | Status: DC | PRN
Start: 2023-06-14 — End: 2023-06-14
  Administered 2023-06-14 (×2): 500 mL

## 2023-06-14 MED ORDER — ASPIRIN 81 MG PO CHEW: 81.0000 mg | CHEWABLE_TABLET | ORAL | Status: DC

## 2023-06-14 MED ORDER — POTASSIUM CHLORIDE CRYS ER 20 MEQ PO TBCR
60.0000 meq | EXTENDED_RELEASE_TABLET | Freq: Once | ORAL | Status: AC
  Administered 2023-06-14: 60 meq via ORAL
  Filled 2023-06-14: qty 3

## 2023-06-14 MED ORDER — SODIUM CHLORIDE 0.9% FLUSH
3.0000 mL | Freq: Two times a day (BID) | INTRAVENOUS | Status: DC
  Administered 2023-06-14 – 2023-06-15 (×3): 3 mL via INTRAVENOUS

## 2023-06-14 MED ORDER — IOHEXOL 300 MG/ML  SOLN
INTRAMUSCULAR | Status: DC | PRN
  Administered 2023-06-14: 35 mL

## 2023-06-14 MED ORDER — LIDOCAINE HCL (PF) 1 % IJ SOLN
INTRAMUSCULAR | Status: DC | PRN
  Administered 2023-06-14: 2 mL

## 2023-06-14 MED ORDER — SODIUM CHLORIDE 0.9 % WEIGHT BASED INFUSION
3.0000 mL/kg/h | INTRAVENOUS | Status: DC
  Administered 2023-06-14: 3 mL/kg/h via INTRAVENOUS

## 2023-06-14 MED ORDER — MAGNESIUM SULFATE 2 GM/50ML IV SOLN
2.0000 g | Freq: Once | INTRAVENOUS | Status: AC
  Administered 2023-06-14: 2 g via INTRAVENOUS
  Filled 2023-06-14: qty 50

## 2023-06-14 MED ORDER — HEPARIN SODIUM (PORCINE) 1000 UNIT/ML IJ SOLN
INTRAMUSCULAR | Status: AC
  Filled 2023-06-14: qty 10

## 2023-06-14 MED ORDER — HEPARIN (PORCINE) IN NACL 1000-0.9 UT/500ML-% IV SOLN
INTRAVENOUS | Status: AC
  Filled 2023-06-14: qty 1000

## 2023-06-14 MED ORDER — SODIUM CHLORIDE 0.9 % IV SOLN: 250.0000 mL | INTRAVENOUS | Status: AC | PRN

## 2023-06-14 MED ORDER — MIDAZOLAM HCL 2 MG/2ML IJ SOLN
INTRAMUSCULAR | Status: DC | PRN
  Administered 2023-06-14: 1 mg via INTRAVENOUS

## 2023-06-14 MED ORDER — MIDAZOLAM HCL 2 MG/2ML IJ SOLN
INTRAMUSCULAR | Status: AC
  Filled 2023-06-14: qty 2

## 2023-06-14 MED ORDER — HEPARIN SODIUM (PORCINE) 1000 UNIT/ML IJ SOLN
INTRAMUSCULAR | Status: DC | PRN
  Administered 2023-06-14: 2000 [IU] via INTRAVENOUS

## 2023-06-14 MED ORDER — VERAPAMIL HCL 2.5 MG/ML IV SOLN
INTRAVENOUS | Status: DC | PRN
  Administered 2023-06-14: 2.5 mg via INTRA_ARTERIAL

## 2023-06-14 MED ORDER — FENTANYL CITRATE (PF) 100 MCG/2ML IJ SOLN
INTRAMUSCULAR | Status: DC | PRN
  Administered 2023-06-14: 25 ug via INTRAVENOUS

## 2023-06-14 MED ORDER — POTASSIUM PHOSPHATES 15 MMOLE/5ML IV SOLN
30.0000 mmol | Freq: Once | INTRAVENOUS | Status: AC
  Administered 2023-06-14: 30 mmol via INTRAVENOUS
  Filled 2023-06-14: qty 10

## 2023-06-14 MED ORDER — SODIUM CHLORIDE 0.9 % WEIGHT BASED INFUSION: 1.0000 mL/kg/h | INTRAVENOUS | Status: DC

## 2023-06-14 MED ORDER — FENTANYL CITRATE (PF) 100 MCG/2ML IJ SOLN
INTRAMUSCULAR | Status: AC
  Filled 2023-06-14: qty 2

## 2023-06-14 MED ORDER — VERAPAMIL HCL 2.5 MG/ML IV SOLN
INTRAVENOUS | Status: AC
  Filled 2023-06-14: qty 2

## 2023-06-14 MED ORDER — SODIUM CHLORIDE 0.9% FLUSH: 3.0000 mL | INTRAVENOUS | Status: DC | PRN

## 2023-06-14 SURGICAL SUPPLY — 10 items
CATH INFINITI AMBI 5FR JK (CATHETERS) IMPLANT
DEVICE RAD TR BAND REGULAR (VASCULAR PRODUCTS) IMPLANT
DRAPE BRACHIAL (DRAPES) IMPLANT
GLIDESHEATH SLEND SS 6F .021 (SHEATH) IMPLANT
GUIDEWIRE INQWIRE 1.5J.035X260 (WIRE) IMPLANT
INQWIRE 1.5J .035X260CM (WIRE) ×1 IMPLANT
PACK CARDIAC CATH (CUSTOM PROCEDURE TRAY) ×1 IMPLANT
PROTECTION STATION PRESSURIZED (MISCELLANEOUS) ×1 IMPLANT
SET ATX-X65L (MISCELLANEOUS) IMPLANT
STATION PROTECTION PRESSURIZED (MISCELLANEOUS) IMPLANT

## 2023-06-14 NOTE — Progress Notes (Signed)
 Hospitalist in to see pt. At bedside in [facility] recovery. Pt. Verbalized understanding of conversation with MD.

## 2023-06-14 NOTE — Progress Notes (Signed)
 Progress Note   Patient: Sonya Harmon UJW:[id number] DOB: 1971-02-15 DOA: 06/11/2023     3 DOS: the patient was seen and examined on 06/14/2023   Brief hospital course: Taken from H&P.  Sonya Harmon is a 53 y.o. female with medical history significant of CAD status post RCA stenting, HTN, PAD status post intervention, presented with worsening of epigastric pain, chest pain and persistent nausea with vomiting.   Patient was having intermittent epigastric pain for about 2 to 3 months,not associated with meal and no significant nocturnal pain.  Recently developed nausea associated with meals which decreased her p.o. intake and she has lost about 10 pounds in the last 2 months.  On presentation mild tachycardia, otherwise stable vital.  Labs with potassium of 2.7, sodium 131, leukocytosis at 22, hemoglobin 17.5, creatinine 1.3, bilirubin 2.6,  troponin 2900> 3200, CK5 51.  EKG showed no acute ST changes.  CTA chest, abdomen and pelvis with normal aorta, severe mixed aortic atherosclerosis.  Shows severe circumferential wall thickening involving the entirely of esophagus consistent with nonspecific infectious or inflammatory esophagitis.  She was started on heparin infusion and cardiology was consulted.  GI was consulted for concern of severe esophagitis and they are recommending PPI, Carafate and possible antifungal treatment if indicated.  No EGD in 6 weeks for the concern of NSTEMI.  3/8: Vital stable, worsening CK at 907-holding statin, rising troponin so we will continue to trend.  Improving leukocytosis at 15, all cell line decreased likely concentrated yesterday.  Potassium of 2.6 which were repleted.  Magnesium   UA with significant ketone and protein urea.  AKI resolved Troponin peaked at 3478 and now trending down.  Lactic acidosis resolved Echocardiogram with normal EF, no regional wall motion abnormalities and indeterminate diastolic parameter.  Patient has a known CAD s/p stenting in  2023.  Cardiology is recommending continue heparin for 48 hours and likely cath on Monday.  3/9: Hemodynamically stable, improving chest pain.  Going for cardiac cath tomorrow morning.  Potassium remained low at 2.7 and is being repleted.  3/10: S/p cardiac catheterization which shows patent prior stents with stable proximal RCA stenosis.  No intervention was done. Persistent significant hypokalemia along with hypophosphatemia and borderline hypomagnesemia which are being repleted.  Nausea and vomiting improved.  No chest pain   Assessment and Plan: * NSTEMI (non-ST elevated myocardial infarction) [facility]) Patient with nonspecific chest pain, associated nausea and vomiting.  Known history of significant CAD s/p PCI in 2023.  Troponin peaked at 3400 and now trending down. Cardiology is on board, s/p cardiac catheterization with patent prior stents and stable prior stenosis, no culprit found and no further intervention done -Continue statin, Plavix and beta-blocker  Nausea and vomiting Esophagitis.  CTA with no PE or dissection but did show severe circumferential wall thickening involving the entire esophagus, consistent with nonspecific infectious or inflammatory esophagitis. GI was consulted but they are not recommending any procedure or EGD due to concern of recent NSTEMI. Nausea and vomiting improved -Continue with twice daily IV Protonix -Supportive care for nausea and vomiting  Severe dehydration As evident by hemoconcentration and AKI. AKI has been resolved -Continue with IV fluid for another day  Hypokalemia Persistent hypokalemia with mild hypophosphatemia and hypomagnesemia today -Replete electrolytes and monitor  Hyponatremia Slowly improving hyponatremia with sodium at 134 today Likely due to alcohol abuse -Continue to monitor  Rhabdomyolysis Worsening CK [id number] -Holding statin -Giving some IV fluid -Continue to monitor  Alcohol abuse Counseling was  provided. -CIWA protocol   Subjective: Patient was seen after the cardiac catheterization.  No chest pain or shortness of breath.  No more nausea or vomiting.  Physical Exam: Vitals:   06/14/23 1200 06/14/23 1215 06/14/23 1230 06/14/23 1240  BP: (!) 132/114 (!) 152/103 (!) 162/95   Pulse: (!) 55 (!) 59 (!) 57 (!) 55  Resp: 17 15 18 15   Temp:      TempSrc:      SpO2: 100% 99% 99% 100%  Weight:      Height:       General.  Malnourished lady, in no acute distress. Pulmonary.  Lungs clear bilaterally, normal respiratory effort. CV.  Regular rate and rhythm, no JVD, rub or murmur. Abdomen.  Soft, nontender, nondistended, BS positive. CNS.  Alert and oriented .  No focal neurologic deficit. Extremities.  No edema, no cyanosis, pulses intact and symmetrical. Psychiatry.  Judgment and insight appears normal.     Data Reviewed: Prior data reviewed  Family Communication: Discussed with patient  Disposition: Status is: Inpatient Remains inpatient appropriate because: Severity of illness  Planned Discharge Destination: Home  DVT prophylaxis.  Heparin infusion Time spent: 50 minutes  This record has been created using Conservation officer, historic buildings. Errors have been sought and corrected,but may not always be located. Such creation errors do not reflect on the standard of care.   Author: Arnetha Courser, MD 06/14/2023 2:47 PM  For on call review [email or web address].

## 2023-06-14 NOTE — Interval H&P Note (Signed)
 History and Physical Interval Note:  06/14/2023 10:16 AM  Sonya Harmon  has presented today for surgery, with the diagnosis of nstemi.  The various methods of treatment have been discussed with the patient and family. After consideration of risks, benefits and other options for treatment, the patient has consented to  Procedure(s): LEFT HEART CATH AND CORONARY ANGIOGRAPHY (N/A) as a surgical intervention.  The patient's history has been reviewed, patient examined, no change in status, stable for surgery.  I have reviewed the patient's chart and labs.  Questions were answered to the patient's satisfaction.     Lorine Bears

## 2023-06-14 NOTE — Progress Notes (Signed)
 Cardiology Progress Note   Patient Name: Sonya Harmon Date of Encounter: 06/14/2023  Primary Cardiologist: Julien Nordmann, MD  Subjective   She denies chest pain.  She reports improvement in nausea.  Cardiac catheterization was done this morning and showed patent RCA stent with stable moderate proximal RCA stenosis.  No culprit is identified for chest pain and elevated troponin.  Objective   Inpatient Medications    Scheduled Meds:  [MAR Hold] atorvastatin  80 mg Oral Daily   [MAR Hold] buPROPion  150 mg Oral BID   [MAR Hold] clopidogrel  75 mg Oral Daily   [MAR Hold] escitalopram  10 mg Oral Daily   [MAR Hold] folic acid  1 mg Oral Daily   [MAR Hold] gabapentin  300 mg Oral TID   [MAR Hold] metoprolol tartrate  25 mg Oral BID   [MAR Hold] multivitamin with minerals  1 tablet Oral Daily   [MAR Hold] pantoprazole (PROTONIX) IV  40 mg Intravenous Q12H   [MAR Hold] potassium chloride  60 mEq Oral Once   sodium chloride flush  3 mL Intravenous Q12H   [MAR Hold] sucralfate  1 g Oral TID WC & HS   [MAR Hold] thiamine  100 mg Oral Daily   Or   [MAR Hold] thiamine  100 mg Intravenous Daily   [MAR Hold] varenicline  1 mg Oral BID   Continuous Infusions:  sodium chloride     lactated ringers 100 mL/hr at 06/13/23 1800   [MAR Hold] potassium PHOSPHATE IVPB (in mmol)     [MAR Hold] promethazine (PHENERGAN) injection (IM or IVPB) 12.5 mg (06/13/23 1958)   PRN Meds: sodium chloride, [MAR Hold] LORazepam **OR** [MAR Hold] LORazepam, [MAR Hold] promethazine (PHENERGAN) injection (IM or IVPB), sodium chloride flush   Vital Signs    Vitals:   06/14/23 1058 06/14/23 1100 06/14/23 1115 06/14/23 1130  BP: 114/80 114/74 (!) 160/87 (!) 152/102  Pulse: (!) 57 (!) 57 (!) 56 (!) 56  Resp: 12 17 17 16   Temp:      TempSrc:      SpO2: 100% 97% 98% 99%  Weight:      Height:        Intake/Output Summary (Last 24 hours) at 06/14/2023 1201 Last data filed at 06/14/2023 8657 Gross per 24  hour  Intake 2721.32 ml  Output 1300 ml  Net 1421.32 ml   Filed Weights   06/11/23 0733 06/14/23 0600 06/14/23 0825  Weight: 43.1 kg 45 kg 45 kg    Physical Exam   GEN: Thin, in no acute distress.  HEENT: Grossly normal.  Neck: Supple, no JVD, carotid bruits, or masses. Cardiac: RRR, no murmurs, rubs, or gallops. No clubbing, cyanosis, edema.  Radials 2+, DP/PT 2+ and equal bilaterally.  Respiratory:  Respirations regular and unlabored, clear to auscultation bilaterally. GI: Soft, nontender, nondistended, BS + x 4. MS: no deformity or atrophy. Skin: warm and dry, no rash. Neuro:  Strength and sensation are intact. Psych: AAOx3.  Normal affect.  Labs    Chemistry Recent Labs  Lab 06/11/23 [phone] 06/11/23 2012 06/13/23 0519 06/13/23 1436 06/14/23 0356  NA 131*   < > 131* 132* 134*  K 2.7*   < > 2.7* 5.2* 2.8*  CL 80*   < > 95* 99 98  CO2 19*   < > 25 22 25   GLUCOSE 157*   < > 80 89 85  BUN 10   < > 5* 6 6  CREATININE 1.39*   < >  0.52 0.56 0.56  CALCIUM 10.0   < > 8.2* 8.2* 8.4*  PROT 9.7*  --  5.7*  --   --   ALBUMIN 5.7*  --  3.2*  --   --   AST 83*  --  62*  --   --   ALT 37  --  25  --   --   ALKPHOS 74  --  39  --   --   BILITOT 2.6*  --  1.1  --   --   GFRNONAA 46*   < > >60 >60 >60  ANIONGAP 32*   < > 11 11 11    < > = values in this interval not displayed.     Hematology Recent Labs  Lab 06/12/23 0135 06/13/23 0519 06/14/23 0356  WBC 15.0* 7.6 7.0  RBC 3.65* 3.43* 3.43*  HGB 12.5 11.7* 11.7*  HCT 34.9* 32.5* 33.4*  MCV 95.6 94.8 97.4  MCH 34.2* 34.1* 34.1*  MCHC 35.8 36.0 35.0  RDW 14.1 13.9 13.9  PLT 301 236 217    Cardiac Enzymes  Recent Labs  Lab 06/11/23 0813 06/11/23 0853 06/11/23 1648 06/12/23 1041 06/12/23 1252  TROPONINIHS 2,960* 3,286* 3,478* 2,006* 1,465*      BNP    Component Value Date/Time   BNP 30.4 09/07/2021 1125    Lipids  Lab Results  Component Value Date   CHOL 202 (H) 04/01/2023   HDL 101 04/01/2023    LDLCALC 91 04/01/2023   TRIG 54 04/01/2023   CHOLHDL 2.0 04/01/2023    Radiology    CT Angio Chest/Abd/Pel for Dissection W and/or W/WO Result Date: 06/11/2023 CLINICAL DATA:  Acute aortic syndrome suspected, chest pain, abdominal pain, nausea and vomiting EXAM: CT ANGIOGRAPHY CHEST, ABDOMEN AND PELVIS TECHNIQUE: Non-contrast CT of the chest was initially obtained. Multidetector CT imaging through the chest, abdomen and pelvis was performed using the standard protocol during bolus administration of intravenous contrast. Multiplanar reconstructed images and MIPs were obtained and reviewed to evaluate the vascular anatomy. RADIATION DOSE REDUCTION: This exam was performed according to the departmental dose-optimization program which includes automated exposure control, adjustment of the mA and/or kV according to patient size and/or use of iterative reconstruction technique. CONTRAST:  75mL OMNIPAQUE IOHEXOL 350 MG/ML SOLN COMPARISON:  CT chest, 05/25/2023, CT abdomen pelvis, 02/21/2023 FINDINGS: CTA CHEST FINDINGS VASCULAR Aorta: Satisfactory opacification of the aorta. Normal caliber of the thoracic aorta. Severe mixed aortic atherosclerosis with a large burden of irregular mural thrombus, predominantly involving the descending thoracic aorta. No evidence of aneurysm, dissection, or other acute aortic pathology. Cardiovascular: No evidence of pulmonary embolism on limited non-tailored examination. Normal heart size. Left and right coronary artery calcifications and stents. No pericardial effusion. Review of the MIP images confirms the above findings. NON VASCULAR Mediastinum/Nodes: No enlarged mediastinal, hilar, or axillary lymph nodes. Severe circumferential wall thickening involving the entirety of the esophagus (series 6, image 104). Thyroid and trachea demonstrate no significant findings. Lungs/Pleura: Minimal centrilobular and paraseptal emphysema. Background of fine centrilobular nodularity throughout  the lungs. No pleural effusion or pneumothorax. Musculoskeletal: No chest wall abnormality. No acute osseous findings. Review of the MIP images confirms the above findings. CTA ABDOMEN AND PELVIS FINDINGS VASCULAR Normal contour and caliber of the abdominal aorta. No evidence of aneurysm, dissection, or other acute aortic pathology. Severe mixed calcific atherosclerosis. Vascular variant direct origin of the hepatic artery from the aorta with otherwise standard branching pattern of the abdominal aorta. Review of the MIP images  confirms the above findings. NON-VASCULAR Hepatobiliary: No solid liver abnormality is seen. Hepatic steatosis no gallstones, gallbladder wall thickening, or biliary dilatation. Pancreas: Unremarkable. No pancreatic ductal dilatation or surrounding inflammatory changes. Spleen: Normal in size without significant abnormality. Adrenals/Urinary Tract: Adrenal glands are unremarkable. Kidneys are normal, without renal calculi, solid lesion, or hydronephrosis. Bladder is unremarkable. Stomach/Bowel: Stomach is within normal limits. Appendix appears normal. No evidence of bowel wall thickening, distention, or inflammatory changes. Lymphatic: No enlarged abdominal or pelvic lymph nodes. Reproductive: No mass or other significant abnormality. Other: No abdominal wall hernia or abnormality. No ascites. Musculoskeletal: No acute osseous findings. IMPRESSION: 1. Normal caliber of the thoracic and abdominal aorta. Severe mixed aortic atherosclerosis, significantly advanced for patient age. No evidence of aneurysm, dissection, or other acute aortic pathology. 2. Severe circumferential wall thickening involving the entirety of the esophagus, consistent with nonspecific infectious or inflammatory esophagitis. 3. Minimal emphysema and smoking-related respiratory bronchiolitis. 4. Coronary artery disease. 5. Hepatic steatosis. Aortic Atherosclerosis (ICD10-I70.0) and Emphysema (ICD10-J43.9). Electronically  Signed   By: Jearld Lesch M.D.   On: 06/11/2023 10:38   DG Chest 2 View Result Date: 06/11/2023 CLINICAL DATA:  Chest pain. EXAM: CHEST - 2 VIEW COMPARISON:  February 20, 2023. FINDINGS: The heart size and mediastinal contours are within normal limits. Both lungs are clear. The visualized skeletal structures are unremarkable. IMPRESSION: No active cardiopulmonary disease. Electronically Signed   By: Lupita Raider M.D.   On: 06/11/2023 10:28     Telemetry    RSR, 60's - Personally Reviewed  Cardiac Studies   Cardiac Catheterization and Percutaneous Coronary Intervention 6.2023    *STENT ONYX FRONTIER 2.75X38 _____________  2D Echocardiogram 3.8.2025   1. Left ventricular ejection fraction, by estimation, is 60 to 65%. The  left ventricle has normal function. The left ventricle has no regional  wall motion abnormalities. Left ventricular diastolic parameters are  indeterminate. The average left  ventricular global longitudinal strain is -21.2 %. The global longitudinal  strain is normal.   2. Right ventricular systolic function is normal. The right ventricular  size is normal. Tricuspid regurgitation signal is inadequate for assessing  PA pressure.   3. The mitral valve is normal in structure. Moderate mitral valve  regurgitation. No evidence of mitral stenosis.   4. Tricuspid valve regurgitation is mild to moderate.   5. The aortic valve is tricuspid. Aortic valve regurgitation is not  visualized. No aortic stenosis is present.   6. The inferior vena cava is normal in size with greater than 50%  respiratory variability, suggesting right atrial pressure of 3 mmHg.  _____________   Patient Profile     53 y.o. female w/ a h/o CAD s/p RCA stenting in 2023, LAD bridging, PAD, tob abuse, alcohol abuse, HL, and cancer of the tongue, who was admitted 3/7 w/ chest pain, n, vomiting, and NSTEMI.  Assessment & Plan    1.  Elevated troponin: Seems to be due to supply demand ischemia  in the setting of severe GI symptoms, severe electrolyte abnormalities and mild volume depletion.  Cardiac catheterization showed no evidence of obstructive disease.  Recommend continuing medical therapy.  No further cardiac workup is recommended at this time.  2.  N/V/Esophagitis:  Nausea improved .  Not much appetite.  No vomiting this AM.  Cont antiemetics and PPI.  Watch QTc (450 this AM).  3.  Primary HTN: Blood pressure continues to fluctuate and seems to be on the high side.  Consider adding an  ARB or spironolactone especially in the setting of persistent hypokalemia.  4.  HL: Continue atorvastatin.  5.  ETOH abuse:  CIWA protocol.  6.  AKI:  resolved.    Signed, Lorine Bears, MD  06/14/2023, 12:01 PM    For questions or updates, please contact   Please consult [email or web address] for contact info under Cardiology/STEMI.

## 2023-06-14 NOTE — Consult Note (Signed)
 Pharmacy Consult Note - Anticoagulation  Pharmacy Consult for heparin Indication: chest pain/ACS  PATIENT MEASUREMENTS: Height: 5\' 2"  (157.5 cm) Weight: 43.1 kg (95 lb) IBW/kg (Calculated) : 50.1 HEPARIN DW (KG): 43.1  VITAL SIGNS: Temp: 98.3 F (36.8 C) (03/10 0014) Temp Source: Oral (03/10 0014) BP: 161/89 (03/10 0014) Pulse Rate: 70 (03/10 0014)  Recent Labs    06/11/23 0813 06/11/23 0853 06/12/23 1252 06/12/23 1657 06/13/23 0519 06/13/23 1436 06/14/23 0356  HGB 17.5*   < >  --   --  11.7*  --  11.7*  HCT 51.9*   < >  --   --  32.5*  --  33.4*  PLT 471*   < >  --   --  236  --  217  APTT 26  --   --   --   --   --   --   LABPROT 12.6  --   --   --   --   --   --   INR 0.9  --   --   --   --   --   --   HEPARINUNFRC  --    < >  --    < > 0.35  --  0.31  CREATININE  --    < >  --   --  0.52 0.56  --   CKTOTAL 551*   < >  --   --  819*  --   --   TROPONINIHS 2,960*   < > 1,465*  --   --   --   --    < > = values in this interval not displayed.    Estimated Creatinine Clearance: 57.2 mL/min (by C-G formula based on SCr of 0.56 mg/dL).  PAST MEDICAL HISTORY: Past Medical History:  Diagnosis Date   Alcohol abuse    Coronary artery disease 09/2021   s/p PCI to RCA in setting of unstable angina   History of tongue cancer    PAD (peripheral artery disease) (HCC)    Tobacco abuse     ASSESSMENT: 53 y.o. female with PMH including unstable angina, CAD s/p PCI (09/2021), HTN, tobacco use, CVA, superior mesenteric artery stenosis, PAD, PUD is presenting with chest pain and concerns for ACS. Chest pain started after episode of vomiting yesterday. Cardiac troponins trending up, currently at 3,286. Patient is not on chronic anticoagulation per chart review. Pharmacy has been consulted to initiate and manage heparin intravenous infusion.  Pertinent medications: Not on chronic anticoagulation prior to admission per chart review   Goal(s) of therapy: Heparin level 0.3 - 0.7  units/mL Monitor platelets by anticoagulation protocol: Yes   Baseline anticoagulation labs: Recent Labs    06/11/23 0813 06/12/23 0135 06/13/23 0519 06/14/23 0356  APTT 26  --   --   --   INR 0.9  --   --   --   HGB 17.5* 12.5 11.7* 11.7*  PLT 471* 301 236 217    Date Time HL Rate/Comment 3/7 1648 0.16 Subtherapeutic@500  units/hr 3/8 0135 0.20 Subtherapeutic@650  units/hr 3/8 0847 0.12 Subtherapeutic@750  units/hr 3/8 1657 0.38 Therapeutic x 1@900  units/hr 3/8 2304 0.40 Therapeutic x 2 3/9 0519 0.35 Therapeutic x 3 3/10 0356 0.31 Therapeutic x 4    PLAN: HL therapeutic x 4 Continue heparin infusion at 900 units/hour. Recheck heparin level daily w/ AM labs while therapeutic Monitor CBC daily while on heparin infusion.  Otelia Sergeant, PharmD, [facility] 06/14/2023 4:46 AM

## 2023-06-14 NOTE — Telephone Encounter (Signed)
 Patient Product/process development scientist completed.    The patient is insured through [employer]. Patient has [employer], may use a copay card, and/or apply for patient assistance if available.    Ran test claim for varenicline (Chantix) 1 mg and the current 30 day co-pay is $0.00.   This test claim was processed through [facility]- copay amounts may vary at other pharmacies due to pharmacy/plan contracts, or as the patient moves through the different stages of their insurance plan.     Roland Earl, CPHT Pharmacy Technician III Certified Patient Advocate [facility] Pharmacy Patient Advocate Team Direct Number: [phone]  Fax: [phone]

## 2023-06-14 NOTE — Consult Note (Signed)
 PHARMACY CONSULT NOTE - ELECTROLYTES  Pharmacy Consult for Electrolyte Monitoring and Replacement   Recent Labs: Height: 5\' 2"  (157.5 cm) Weight: 45 kg (99 lb 3.3 oz) IBW/kg (Calculated) : 50.1 Estimated Creatinine Clearance: 59.8 mL/min (by C-G formula based on SCr of 0.56 mg/dL).  Potassium (mmol/L)  Date Value  06/14/2023 2.8 (L)   Magnesium (mg/dL)  Date Value  16/01/9603 1.8   Calcium (mg/dL)  Date Value  54/12/8117 8.4 (L)   Albumin (g/dL)  Date Value  14/78/2956 3.2 (L)  04/01/2023 4.7   Phosphorus (mg/dL)  Date Value  21/30/8657 1.8 (L)   Sodium (mmol/L)  Date Value  06/14/2023 134 (L)  12/22/2022 137   Assessment  Sonya Harmon is a 53 y.o. female presenting with nausea/vomiting/abdominal pain. PMH significant for HTN, HLD, and CAD. Pharmacy has been consulted to monitor and replace electrolytes.  Diet: PO MIVF: NS @ 100 mL/hr  Goal of Therapy: Electrolytes WNL  Plan:  K 2.8, Phos 1.8 - will replace with Kcl po x 1 plus Kphos 30 mmol IV x 1 Mg 1.8, replace with MgSulfate 2gm IV x 1 dose Check BMP, Mag, Phos with AM labs   Thank you for allowing pharmacy to be a part of this patient's care.  Smantha Boakye Rodriguez-Guzman PharmD, BCPS 06/14/2023 8:13 AM

## 2023-06-15 DIAGNOSIS — F1090 Alcohol use, unspecified, uncomplicated: Secondary | ICD-10-CM | POA: Diagnosis not present

## 2023-06-15 DIAGNOSIS — M6282 Rhabdomyolysis: Secondary | ICD-10-CM

## 2023-06-15 DIAGNOSIS — I214 Non-ST elevation (NSTEMI) myocardial infarction: Secondary | ICD-10-CM | POA: Diagnosis not present

## 2023-06-15 DIAGNOSIS — Z87891 Personal history of nicotine dependence: Secondary | ICD-10-CM | POA: Diagnosis not present

## 2023-06-15 DIAGNOSIS — R112 Nausea with vomiting, unspecified: Secondary | ICD-10-CM | POA: Diagnosis not present

## 2023-06-15 DIAGNOSIS — E860 Dehydration: Secondary | ICD-10-CM | POA: Diagnosis not present

## 2023-06-15 DIAGNOSIS — I2510 Atherosclerotic heart disease of native coronary artery without angina pectoris: Secondary | ICD-10-CM | POA: Diagnosis not present

## 2023-06-15 DIAGNOSIS — F411 Generalized anxiety disorder: Secondary | ICD-10-CM | POA: Diagnosis not present

## 2023-06-15 DIAGNOSIS — K209 Esophagitis, unspecified without bleeding: Secondary | ICD-10-CM | POA: Diagnosis not present

## 2023-06-15 DIAGNOSIS — Z1331 Encounter for screening for depression: Secondary | ICD-10-CM | POA: Diagnosis not present

## 2023-06-15 DIAGNOSIS — G621 Alcoholic polyneuropathy: Secondary | ICD-10-CM | POA: Diagnosis not present

## 2023-06-15 DIAGNOSIS — R9431 Abnormal electrocardiogram [ECG] [EKG]: Secondary | ICD-10-CM | POA: Diagnosis not present

## 2023-06-15 LAB — CBC
HCT: 34.6 % — ABNORMAL LOW (ref 36.0–46.0)
Hemoglobin: 12.1 g/dL (ref 12.0–15.0)
MCH: 34.4 pg — ABNORMAL HIGH (ref 26.0–34.0)
MCHC: 35 g/dL (ref 30.0–36.0)
MCV: 98.3 fL (ref 80.0–100.0)
Platelets: 225 10*3/uL (ref 150–400)
RBC: 3.52 MIL/uL — ABNORMAL LOW (ref 3.87–5.11)
RDW: 13.9 % (ref 11.5–15.5)
WBC: 6.3 10*3/uL (ref 4.0–10.5)
nRBC: 0 % (ref 0.0–0.2)

## 2023-06-15 LAB — BASIC METABOLIC PANEL
Anion gap: 10 (ref 5–15)
BUN: 7 mg/dL (ref 6–20)
CO2: 26 mmol/L (ref 22–32)
Calcium: 8.3 mg/dL — ABNORMAL LOW (ref 8.9–10.3)
Chloride: 98 mmol/L (ref 98–111)
Creatinine, Ser: 0.59 mg/dL (ref 0.44–1.00)
GFR, Estimated: >60 mL/min (ref >60)
Glucose, Bld: 80 mg/dL (ref 70–99)
Potassium: 3.7 mmol/L (ref 3.5–5.1)
Sodium: 134 mmol/L — ABNORMAL LOW (ref 135–145)

## 2023-06-15 LAB — MAGNESIUM: Magnesium: 2.4 mg/dL (ref 1.7–2.4)

## 2023-06-15 LAB — PHOSPHORUS: Phosphorus: 3.2 mg/dL (ref 2.5–4.6)

## 2023-06-15 MED ORDER — METOPROLOL TARTRATE 25 MG PO TABS: 25.0000 mg | ORAL_TABLET | Freq: Two times a day (BID) | ORAL | 2 refills | Status: AC

## 2023-06-15 MED ORDER — ADULT MULTIVITAMIN W/MINERALS CH
1.0000 | ORAL_TABLET | Freq: Every day | ORAL | 1 refills | Status: AC
Start: 2023-06-15 — End: ?

## 2023-06-15 MED ORDER — PANTOPRAZOLE SODIUM 40 MG PO TBEC
40.0000 mg | DELAYED_RELEASE_TABLET | Freq: Two times a day (BID) | ORAL | Status: DC
  Administered 2023-06-15: 40 mg via ORAL
  Filled 2023-06-15: qty 1

## 2023-06-15 MED ORDER — VITAMIN B-1 100 MG PO TABS: 100.0000 mg | ORAL_TABLET | Freq: Every day | ORAL | 1 refills | Status: AC

## 2023-06-15 MED ORDER — SUCRALFATE 1 GM/10ML PO SUSP: 1.0000 g | Freq: Three times a day (TID) | ORAL | 0 refills | Status: DC

## 2023-06-15 MED ORDER — OMEPRAZOLE 40 MG PO CPDR: 40.0000 mg | DELAYED_RELEASE_CAPSULE | Freq: Two times a day (BID) | ORAL | 3 refills | Status: DC

## 2023-06-15 MED ORDER — FOLIC ACID 1 MG PO TABS: 1.0000 mg | ORAL_TABLET | Freq: Every day | ORAL | 1 refills | Status: AC

## 2023-06-15 NOTE — Plan of Care (Signed)

## 2023-06-15 NOTE — Progress Notes (Signed)
 PHARMACIST - PHYSICIAN COMMUNICATION  DR:   Nelson Chimes  CONCERNING: IV to Oral Route Change Policy  RECOMMENDATION: This patient is receiving pantoprazole by the intravenous route.  Based on criteria approved by the Pharmacy and Therapeutics Committee, the intravenous medication(s) is/are being converted to the equivalent oral dose form(s).   DESCRIPTION: These criteria include: The patient is eating (either orally or via tube) and/or has been taking other orally administered medications for a least 24 hours The patient has no evidence of active gastrointestinal bleeding or impaired GI absorption (gastrectomy, short bowel, patient on TNA or NPO).  Mearl Olver Rodriguez-Guzman PharmD, BCPS 06/15/2023 8:33 AM

## 2023-06-15 NOTE — Progress Notes (Signed)
   Patient Name: Sonya Harmon Date of Encounter: 06/15/2023 [facility] Cardiologist: Julien Nordmann, MD   Interval Summary  .    Cardiac cath showed no obstruction. Cath site is stable. She is ready to go home.   Vital Signs .    Vitals:   06/15/23 0033 06/15/23 0434 06/15/23 0500 06/15/23 0737  BP: 107/84 115/73  121/73  Pulse: 64 (!) 54  60  Resp: 18 18    Temp: 98.5 F (36.9 C) 98.1 F (36.7 C)  97.7 F (36.5 C)  TempSrc: Oral     SpO2: 98% 98%  100%  Weight:   44.4 kg   Height:        Intake/Output Summary (Last 24 hours) at 06/15/2023 0858 Last data filed at 06/15/2023 0600 Gross per 24 hour  Intake 1197.42 ml  Output --  Net 1197.42 ml      06/15/2023    5:00 AM 06/14/2023    8:25 AM 06/14/2023    6:00 AM  Last 3 Weights  Weight (lbs) 97 lb 14.2 oz 99 lb 3.3 oz 99 lb 3.3 oz  Weight (kg) 44.4 kg 45 kg 45 kg      Telemetry/ECG    NSR/SB HR 50-60s - Personally Reviewed  Physical Exam .   GEN: No acute distress.   Neck: No JVD Cardiac: RRR, no murmurs, rubs, or gallops.  Respiratory: Clear to auscultation bilaterally. GI: Soft, nontender, non-distended  MS: No edema  Assessment & Plan .    Elevated troponin - Cardiac cath showed no evidence of obstructive disease - suspect supply demand mismatch in the setting of severe GI symptoms, severe electrolyte abnormalities - she denies chest pain or SOB - cath site is stable - continue Plavix 75mg  daily, Lipitor 80mg  daily, Lopressor 25mg  BID  N/V/Esophagitis - resolved  HTN - BP good - continue metoprolol 25mg  BID  HL - continue Lipitor - LDL 91, HDL 101, total chol 202, TG 54  ETOH abuse - CIWA protocol  AKI - resolved   For questions or updates, please contact [facility] Please consult [email or web address] for contact info under        Signed, Breya Cass David Stall, PA-C

## 2023-06-15 NOTE — Progress Notes (Signed)
 AVS, work note and printed prescription given to patient.

## 2023-06-15 NOTE — Consult Note (Signed)
 PHARMACY CONSULT NOTE - ELECTROLYTES  Pharmacy Consult for Electrolyte Monitoring and Replacement   Recent Labs: Height: 5\' 2"  (157.5 cm) Weight: 44.4 kg (97 lb 14.2 oz) IBW/kg (Calculated) : 50.1 Estimated Creatinine Clearance: 59 mL/min (by C-G formula based on SCr of 0.59 mg/dL).  Potassium (mmol/L)  Date Value  06/15/2023 3.7   Magnesium (mg/dL)  Date Value  82/95/6213 2.4   Calcium (mg/dL)  Date Value  08/65/7846 8.3 (L)   Albumin (g/dL)  Date Value  96/29/5284 3.2 (L)  04/01/2023 4.7   Phosphorus (mg/dL)  Date Value  13/24/4010 3.2   Sodium (mmol/L)  Date Value  06/15/2023 134 (L)  12/22/2022 137   Assessment  Sonya Harmon is a 53 y.o. female presenting with nausea/vomiting/abdominal pain. PMH significant for HTN, HLD, and CAD. Pharmacy has been consulted to monitor and replace electrolytes.  Diet: PO MIVF: NS @ 100 mL/hr  Goal of Therapy: Electrolytes WNL  Plan:  No replacements needed today Check BMP with AM labs   Thank you for allowing pharmacy to be a part of this patient's care.  Rande Dario Rodriguez-Guzman PharmD, BCPS 06/15/2023 8:28 AM

## 2023-06-15 NOTE — Discharge Summary (Signed)
 Physician Discharge Summary   Patient: Sonya Harmon MRN: [id number] DOB: [birth year]  Admit date:     06/11/2023  Discharge date: 06/15/23  Discharge Physician: Arnetha Courser   PCP: Ailene Ravel, MD   Recommendations at discharge:  Please obtain CBC, BMP and magnesium level on follow-up Follow-up with cardiology Follow-up with gastroenterology Follow-up with primary care provider  Discharge Diagnoses: Principal Problem:   NSTEMI (non-ST elevated myocardial infarction) [facility]) Active Problems:   Nausea and vomiting   Severe dehydration   Hypokalemia   Hyponatremia   Rhabdomyolysis   Alcohol abuse   Coronary artery disease of native artery of native heart with stable angina pectoris (HCC)   Prolonged Q-T interval on ECG   Hospital Course: Taken from H&P.  Sonya Harmon is a 53 y.o. female with medical history significant of CAD status post RCA stenting, HTN, PAD status post intervention, presented with worsening of epigastric pain, chest pain and persistent nausea with vomiting.   Patient was having intermittent epigastric pain for about 2 to 3 months,not associated with meal and no significant nocturnal pain.  Recently developed nausea associated with meals which decreased her p.o. intake and she has lost about 10 pounds in the last 2 months.  On presentation mild tachycardia, otherwise stable vital.  Labs with potassium of 2.7, sodium 131, leukocytosis at 22, hemoglobin 17.5, creatinine 1.3, bilirubin 2.6,  troponin 2900> 3200, CK5 51.  EKG showed no acute ST changes.  CTA chest, abdomen and pelvis with normal aorta, severe mixed aortic atherosclerosis.  Shows severe circumferential wall thickening involving the entirely of esophagus consistent with nonspecific infectious or inflammatory esophagitis.  She was started on heparin infusion and cardiology was consulted.  GI was consulted for concern of severe esophagitis and they are recommending PPI, Carafate and possible  antifungal treatment if indicated.  No EGD in 6 weeks for the concern of NSTEMI.  3/8: Vital stable, worsening CK at 907-holding statin, rising troponin so we will continue to trend.  Improving leukocytosis at 15, all cell line decreased likely concentrated yesterday.  Potassium of 2.6 which were repleted.  Magnesium   UA with significant ketone and protein urea.  AKI resolved Troponin peaked at 3478 and now trending down.  Lactic acidosis resolved Echocardiogram with normal EF, no regional wall motion abnormalities and indeterminate diastolic parameter.  Patient has a known CAD s/p stenting in 2023.  Cardiology is recommending continue heparin for 48 hours and likely cath on Monday.  3/9: Hemodynamically stable, improving chest pain.  Going for cardiac cath tomorrow morning.  Potassium remained low at 2.7 and is being repleted.  3/10: S/p cardiac catheterization which shows patent prior stents with stable proximal RCA stenosis.  No intervention was done. Persistent significant hypokalemia along with hypophosphatemia and borderline hypomagnesemia which are being repleted.  Nausea and vomiting improved.  No chest pain.  3/11: Patient remained hemodynamically stable.  Labs stable and electrolyte abnormalities were corrected. Patient was counseled again for smoking and alcohol cessation. She has Chantix at home which she will now resume. Patient should be on twice daily PPI for which she will use her home Prilosec twice daily instead of once for next 4 to 5 weeks.  She was also given Carafate for concern of esophagitis. Patient need to follow-up with her cardiologist and gastroenterologist.  She will continue on current medications and need to have a close follow-up with her providers for further assistance.  Assessment and Plan: * NSTEMI (non-ST elevated myocardial infarction) (HCC)  Patient with nonspecific chest pain, associated nausea and vomiting.  Known history of significant CAD s/p PCI in  2023.  Troponin peaked at 3400 and now trending down. Cardiology is on board, s/p cardiac catheterization with patent prior stents and stable prior stenosis, no culprit found and no further intervention done -Continue statin, Plavix and beta-blocker  Nausea and vomiting Esophagitis.  CTA with no PE or dissection but did show severe circumferential wall thickening involving the entire esophagus, consistent with nonspecific infectious or inflammatory esophagitis. GI was consulted but they are not recommending any procedure or EGD due to concern of recent NSTEMI. Nausea and vomiting improved -Continue with twice daily PPI  Severe dehydration As evident by hemoconcentration and AKI. AKI has been resolved  Hypokalemia Persistent hypokalemia with mild hypophosphatemia and hypomagnesemia today -Replete electrolytes and monitor  Hyponatremia Electrolyte abnormalities were corrected with repletion before discharge. -Continue to monitor  Rhabdomyolysis Her home statin was initially held due to elevated CK which started improving with IV fluid, patient will resume on discharge -Continue to monitor  Alcohol abuse Counseling was provided. -CIWA protocol  Consultants: Cardiology.  Gastroenterology Procedures performed: Cardiac catheterization Disposition: Home Diet recommendation:  Discharge Diet Orders (From admission, onward)     Start     Ordered   06/15/23 0000  Diet - low sodium heart healthy        06/15/23 1030           Cardiac diet DISCHARGE MEDICATION: Allergies as of 06/15/2023       Reactions   Aspirin Other (See Comments)   Bleeding ulcers   Amoxicill-clarithro-lansopraz Nausea And Vomiting   Amoxicillin Rash        Medication List     TAKE these medications    atorvastatin 80 MG tablet Commonly known as: LIPITOR TAKE 1 TABLET BY MOUTH EVERY DAY   buPROPion 150 MG 12 hr tablet Commonly known as: WELLBUTRIN SR TAKE 1 TABLET BY MOUTH TWICE A DAY    clopidogrel 75 MG tablet Commonly known as: PLAVIX Take 1 tablet (75 mg total) by mouth daily.   escitalopram 10 MG tablet Commonly known as: LEXAPRO Take 10 mg by mouth daily.   folic acid 1 MG tablet Commonly known as: FOLVITE Take 1 tablet (1 mg total) by mouth daily.   gabapentin 300 MG capsule Commonly known as: NEURONTIN Take 300 mg by mouth 3 (three) times daily.   metoprolol tartrate 25 MG tablet Commonly known as: LOPRESSOR Take 1 tablet (25 mg total) by mouth 2 (two) times daily.   multivitamin with minerals Tabs tablet Take 1 tablet by mouth daily.   omeprazole 40 MG capsule Commonly known as: PRILOSEC Take 1 capsule (40 mg total) by mouth 2 (two) times daily. What changed: when to take this   ondansetron 4 MG disintegrating tablet Commonly known as: ZOFRAN-ODT Take 1 tablet (4 mg total) by mouth every 8 (eight) hours as needed for nausea or vomiting.   sucralfate 1 GM/10ML suspension Commonly known as: CARAFATE Take 10 mLs (1 g total) by mouth 4 (four) times daily -  with meals and at bedtime.   thiamine 100 MG tablet Commonly known as: Vitamin B-1 Take 1 tablet (100 mg total) by mouth daily.   varenicline 1 MG tablet Commonly known as: Chantix Continuing Month Pak Take 1 tablet (1 mg total) by mouth 2 (two) times daily. What changed: Another medication with the same name was removed. Continue taking this medication, and follow the directions you see  here.        Follow-up Information     Hamrick, Maura L, MD. Schedule an appointment as soon as possible for a visit in 1 week(s).   Specialty: Family Medicine Contact information: [address] [address] [phone]                Discharge Exam: Filed Weights   06/14/23 0600 06/14/23 0825 06/15/23 0500  Weight: 45 kg 45 kg 44.4 kg   General.  Malnourished lady, in no acute distress. Pulmonary.  Lungs clear bilaterally, normal respiratory effort. CV.  Regular rate and  rhythm, no JVD, rub or murmur. Abdomen.  Soft, nontender, nondistended, BS positive. CNS.  Alert and oriented .  No focal neurologic deficit. Extremities.  No edema, no cyanosis, pulses intact and symmetrical. Psychiatry.  Judgment and insight appears normal.   Condition at discharge: stable  The results of significant diagnostics from this hospitalization (including imaging, microbiology, ancillary and laboratory) are listed below for reference.   Imaging Studies: CARDIAC CATHETERIZATION Addendum Date: 06/14/2023   Prox RCA lesion is 40% stenosed.   Previously placed Mid RCA stent of unknown type is  widely patent. 1.  Widely patent mid RCA stent with no significant restenosis.  Stable moderate proximal RCA disease. 2.  Left ventricular angiography was not performed.  EF was normal by echo. 3.  Mildly elevated left ventricular end-diastolic pressure at 14 mmHg. Recommendations: No culprit is identified for elevated troponin which is likely due to supply demand mismatch in the setting of severe GI symptoms and electrolyte abnormalities.  Continue medical therapy.  Result Date: 06/14/2023   Prox RCA lesion is 40% stenosed.   Previously placed Mid RCA stent of unknown type is  widely patent. 1.  Widely patent mid RCA stent with no significant restenosis.  Stable moderate proximal RCA disease. 2.  Left ventricular angiography was not performed.  EF was normal by echo. 3.  Mildly elevated left ventricular end-diastolic pressure at 14 mmHg. Recommendations: No culprit is identified for elevated troponin which is likely due to supply demand mismatch in the setting of severe GI symptoms and electrolyte abnormalities.  Continue medical therapy.   ECHOCARDIOGRAM COMPLETE Result Date: 06/12/2023    ECHOCARDIOGRAM REPORT   Patient Name:   BRIELYNN SEKULA Date of Exam: 06/12/2023 Medical Rec #:  [id number]      Height:       62.0 in Accession #:    [id number]     Weight:       95.0 lb Date of Birth:  1970-05-31       BSA:          1.393 m Patient Age:    52 years       BP:           144/77 mmHg Patient Gender: F              HR:           67 bpm. Exam Location:  [facility] Procedure: 2D Echo, Cardiac Doppler, Color Doppler and Strain Analysis (Both            Spectral and Color Flow Doppler were utilized during procedure). Indications:     NSTEMI I21.4  History:         Patient has prior history of Echocardiogram examinations, most                  recent 10/29/2022.  Sonographer:     Josepha Pigg  Eleanora Neighbor, FASE Referring Phys:  [id number] Emeline General Diagnosing Phys: Julien Nordmann MD  Sonographer Comments: Global longitudinal strain was attempted. IMPRESSIONS  1. Left ventricular ejection fraction, by estimation, is 60 to 65%. The left ventricle has normal function. The left ventricle has no regional wall motion abnormalities. Left ventricular diastolic parameters are indeterminate. The average left ventricular global longitudinal strain is -21.2 %. The global longitudinal strain is normal.  2. Right ventricular systolic function is normal. The right ventricular size is normal. Tricuspid regurgitation signal is inadequate for assessing PA pressure.  3. The mitral valve is normal in structure. Moderate mitral valve regurgitation. No evidence of mitral stenosis.  4. Tricuspid valve regurgitation is mild to moderate.  5. The aortic valve is tricuspid. Aortic valve regurgitation is not visualized. No aortic stenosis is present.  6. The inferior vena cava is normal in size with greater than 50% respiratory variability, suggesting right atrial pressure of 3 mmHg. FINDINGS  Left Ventricle: Left ventricular ejection fraction, by estimation, is 60 to 65%. The left ventricle has normal function. The left ventricle has no regional wall motion abnormalities. The average left ventricular global longitudinal strain is -21.2 %. Strain was performed and the global longitudinal strain is normal. The left ventricular internal cavity size was normal in  size. There is no left ventricular hypertrophy. Left ventricular diastolic parameters are indeterminate. Right Ventricle: The right ventricular size is normal. No increase in right ventricular wall thickness. Right ventricular systolic function is normal. Tricuspid regurgitation signal is inadequate for assessing PA pressure. Left Atrium: Left atrial size was normal in size. Right Atrium: Right atrial size was normal in size. Pericardium: There is no evidence of pericardial effusion. Mitral Valve: The mitral valve is normal in structure. Moderate mitral valve regurgitation. No evidence of mitral valve stenosis. Tricuspid Valve: The tricuspid valve is normal in structure. Tricuspid valve regurgitation is mild to moderate. No evidence of tricuspid stenosis. Aortic Valve: The aortic valve is tricuspid. Aortic valve regurgitation is not visualized. No aortic stenosis is present. Aortic valve peak gradient measures 7.5 mmHg. Pulmonic Valve: The pulmonic valve was normal in structure. Pulmonic valve regurgitation is not visualized. No evidence of pulmonic stenosis. Aorta: The aortic root is normal in size and structure. Venous: The inferior vena cava is normal in size with greater than 50% respiratory variability, suggesting right atrial pressure of 3 mmHg. IAS/Shunts: No atrial level shunt detected by color flow Doppler. Additional Comments: 3D was performed not requiring image post processing on an independent workstation and was indeterminate.  LEFT VENTRICLE PLAX 2D LVIDd:         4.10 cm   Diastology LVIDs:         2.80 cm   LV e' medial:    10.30 cm/s LV PW:         1.00 cm   LV E/e' medial:  8.0 LV IVS:        1.00 cm   LV e' lateral:   10.70 cm/s LVOT diam:     1.80 cm   LV E/e' lateral: 7.7 LV SV:         61 LV SV Index:   44        2D Longitudinal Strain LVOT Area:     2.54 cm  2D Strain GLS Avg:     -21.2 %  RIGHT VENTRICLE RV Basal diam:  2.50 cm RV S prime:     17.10 cm/s TAPSE (M-mode): 2.4 cm LEFT ATRIUM  Index        RIGHT ATRIUM           Index LA diam:      2.80 cm 2.01 cm/m   RA Area:     14.50 cm LA Vol (A2C): 48.2 ml 34.60 ml/m  RA Volume:   38.60 ml  27.71 ml/m LA Vol (A4C): 22.1 ml 15.86 ml/m  AORTIC VALVE                 PULMONIC VALVE AV Area (Vmax): 2.56 cm     PV Vmax:        1.19 m/s AV Vmax:        137.00 cm/s  PV Peak grad:   5.7 mmHg AV Peak Grad:   7.5 mmHg     RVOT Peak grad: 5 mmHg LVOT Vmax:      138.00 cm/s LVOT Vmean:     82.500 cm/s LVOT VTI:       0.241 m  AORTA Ao Root diam: 3.40 cm Ao Asc diam:  3.20 cm MITRAL VALVE MV Area (PHT): 3.66 cm    SHUNTS MV Decel Time: 207 msec    Systemic VTI:  0.24 m MR Peak grad: 86.1 mmHg    Systemic Diam: 1.80 cm MR Mean grad: 56.0 mmHg MR Vmax:      464.00 cm/s MR Vmean:     354.0 cm/s MV E velocity: 82.30 cm/s MV A velocity: 60.30 cm/s MV E/A ratio:  1.36 Julien Nordmann MD Electronically signed by Julien Nordmann MD Signature Date/Time: 06/12/2023/11:58:14 AM    Final    CT Angio Chest/Abd/Pel for Dissection W and/or W/WO Result Date: 06/11/2023 CLINICAL DATA:  Acute aortic syndrome suspected, chest pain, abdominal pain, nausea and vomiting EXAM: CT ANGIOGRAPHY CHEST, ABDOMEN AND PELVIS TECHNIQUE: Non-contrast CT of the chest was initially obtained. Multidetector CT imaging through the chest, abdomen and pelvis was performed using the standard protocol during bolus administration of intravenous contrast. Multiplanar reconstructed images and MIPs were obtained and reviewed to evaluate the vascular anatomy. RADIATION DOSE REDUCTION: This exam was performed according to the departmental dose-optimization program which includes automated exposure control, adjustment of the mA and/or kV according to patient size and/or use of iterative reconstruction technique. CONTRAST:  75mL OMNIPAQUE IOHEXOL 350 MG/ML SOLN COMPARISON:  CT chest, 05/25/2023, CT abdomen pelvis, 02/21/2023 FINDINGS: CTA CHEST FINDINGS VASCULAR Aorta: Satisfactory opacification of  the aorta. Normal caliber of the thoracic aorta. Severe mixed aortic atherosclerosis with a large burden of irregular mural thrombus, predominantly involving the descending thoracic aorta. No evidence of aneurysm, dissection, or other acute aortic pathology. Cardiovascular: No evidence of pulmonary embolism on limited non-tailored examination. Normal heart size. Left and right coronary artery calcifications and stents. No pericardial effusion. Review of the MIP images confirms the above findings. NON VASCULAR Mediastinum/Nodes: No enlarged mediastinal, hilar, or axillary lymph nodes. Severe circumferential wall thickening involving the entirety of the esophagus (series 6, image 104). Thyroid and trachea demonstrate no significant findings. Lungs/Pleura: Minimal centrilobular and paraseptal emphysema. Background of fine centrilobular nodularity throughout the lungs. No pleural effusion or pneumothorax. Musculoskeletal: No chest wall abnormality. No acute osseous findings. Review of the MIP images confirms the above findings. CTA ABDOMEN AND PELVIS FINDINGS VASCULAR Normal contour and caliber of the abdominal aorta. No evidence of aneurysm, dissection, or other acute aortic pathology. Severe mixed calcific atherosclerosis. Vascular variant direct origin of the hepatic artery from the aorta with otherwise standard branching pattern of the abdominal aorta. Review of  the MIP images confirms the above findings. NON-VASCULAR Hepatobiliary: No solid liver abnormality is seen. Hepatic steatosis no gallstones, gallbladder wall thickening, or biliary dilatation. Pancreas: Unremarkable. No pancreatic ductal dilatation or surrounding inflammatory changes. Spleen: Normal in size without significant abnormality. Adrenals/Urinary Tract: Adrenal glands are unremarkable. Kidneys are normal, without renal calculi, solid lesion, or hydronephrosis. Bladder is unremarkable. Stomach/Bowel: Stomach is within normal limits. Appendix appears  normal. No evidence of bowel wall thickening, distention, or inflammatory changes. Lymphatic: No enlarged abdominal or pelvic lymph nodes. Reproductive: No mass or other significant abnormality. Other: No abdominal wall hernia or abnormality. No ascites. Musculoskeletal: No acute osseous findings. IMPRESSION: 1. Normal caliber of the thoracic and abdominal aorta. Severe mixed aortic atherosclerosis, significantly advanced for patient age. No evidence of aneurysm, dissection, or other acute aortic pathology. 2. Severe circumferential wall thickening involving the entirety of the esophagus, consistent with nonspecific infectious or inflammatory esophagitis. 3. Minimal emphysema and smoking-related respiratory bronchiolitis. 4. Coronary artery disease. 5. Hepatic steatosis. Aortic Atherosclerosis (ICD10-I70.0) and Emphysema (ICD10-J43.9). Electronically Signed   By: Jearld Lesch M.D.   On: 06/11/2023 10:38   DG Chest 2 View Result Date: 06/11/2023 CLINICAL DATA:  Chest pain. EXAM: CHEST - 2 VIEW COMPARISON:  February 20, 2023. FINDINGS: The heart size and mediastinal contours are within normal limits. Both lungs are clear. The visualized skeletal structures are unremarkable. IMPRESSION: No active cardiopulmonary disease. Electronically Signed   By: Lupita Raider M.D.   On: 06/11/2023 10:28   CT CHEST LUNG CA SCREEN LOW DOSE W/O CM Result Date: 06/10/2023 CLINICAL DATA:  Lung cancer screening.  Former asymptomatic smoker. EXAM: CT CHEST WITHOUT CONTRAST LOW-DOSE FOR LUNG CANCER SCREENING TECHNIQUE: Multidetector CT imaging of the chest was performed following the standard protocol without IV contrast. RADIATION DOSE REDUCTION: This exam was performed according to the departmental dose-optimization program which includes automated exposure control, adjustment of the mA and/or kV according to patient size and/or use of iterative reconstruction technique. COMPARISON:  CT angio chest 09/07/2021 FINDINGS:  Cardiovascular: Heart size is normal. Aortic atherosclerosis. Coronary artery calcifications. No pericardial effusion. Mediastinum/Nodes: Small nodule/lymph node within the anterior mediastinal measures 0.8 cm, image 25/2. This is unchanged when compared with the previous exam. No enlarged mediastinal or hilar lymph nodes. Thyroid gland and trachea and esophagus are unremarkable. Lungs/Pleura: Mild emphysema with diffuse bronchial wall thickening. No pleural effusion, airspace consolidation, atelectasis or pneumothorax. No suspicious pulmonary nodule or mass identified. Upper Abdomen: No acute abnormality. Musculoskeletal: No chest wall mass or suspicious bone lesions identified. IMPRESSION: 1. Lung-RADS 1, negative. Continue annual screening with low-dose chest CT without contrast in 12 months. 2. Coronary artery calcifications. 3. Aortic Atherosclerosis (ICD10-I70.0) and Emphysema (ICD10-J43.9). Electronically Signed   By: Signa Kell M.D.   On: 06/10/2023 15:26   [place] LT BREAST BX W LOC DEV 1ST LESION IMG BX SPEC US GUIDE Addendum Date: 06/08/2023 ADDENDUM REPORT: 06/08/2023 08:00 ADDENDUM: PATHOLOGY revealed: 1. 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 NEGATIVE FOR ATYPIA OR MALIGNANCY. Pathology results are CONCORDANT with imaging findings, per Dr. Jacob Moores. Pathology results and recommendations were discussed with patient via telephone on 06/04/2023 by Randa Lynn RN. Patient reported biopsy site doing well with no adverse symptoms, and only slight tenderness at the site. Post biopsy care instructions were reviewed, questions were answered and my direct phone number was provided. Patient was instructed to call [facility] for any additional questions or  concerns related to biopsy site. RECOMMENDATION: Patient instructed to resume annual bilateral screening mammogram due February 2026. Pathology results reported by  Randa Lynn RN on 06/04/2023. Electronically Signed   By: Jacob Moores M.D.   On: 06/08/2023 08:00   Result Date: 06/08/2023 CLINICAL DATA:  Indeterminate screen detected mass in the LEFT breast EXAM: ULTRASOUND GUIDED LEFT BREAST CORE NEEDLE BIOPSY COMPARISON:  Previous exam(s). PROCEDURE: I met with the patient and we discussed the procedure of ultrasound-guided biopsy, including benefits and alternatives. We discussed the high likelihood of a successful procedure. We discussed the risks of the procedure, including infection, bleeding, tissue injury, clip migration, and inadequate sampling. Informed written consent was given. The usual time-out protocol was performed immediately prior to the procedure. Lesion quadrant: Upper outer quadrant Using sterile technique and 1% Lidocaine as local anesthetic, under direct ultrasound visualization, a 14 gauge spring-loaded device was used to perform biopsy of a mass in the left breast 2 o'clock position 6 cm from the nipple using an inferior approach. At the conclusion of the procedure, a heart shaped tissue marker clip was deployed into the biopsy cavity. Follow up 2 view mammogram was performed and dictated separately. IMPRESSION: Ultrasound guided biopsy of a mass in the left breast 2 o'clock position 6 cm from the nipple. No apparent complications. Electronically Signed: By: Jacob Moores M.D. On: 06/03/2023 12:03   MM CLIP PLACEMENT LEFT Result Date: 06/03/2023 CLINICAL DATA:  Status post ultrasound-guided biopsy of an indeterminate screen detected mass in the left breast EXAM: 3D DIAGNOSTIC LEFT MAMMOGRAM POST ULTRASOUND BIOPSY COMPARISON:  Previous exam(s). ACR Breast Density Category b: There are scattered areas of fibroglandular density. FINDINGS: 3D Mammographic images were obtained following ultrasound guided biopsy of a mass in the left breast 2 o'clock position 6 cm from the nipple. The heart shaped biopsy marking clip is in expected position at the  site of biopsy. IMPRESSION: Appropriate positioning of the heart shaped biopsy marking clip at the site of biopsy in the left breast 2 o'clock position 6 cm from the nipple. Final Assessment: Post Procedure Mammograms for Marker Placement Electronically Signed   By: Jacob Moores M.D.   On: 06/03/2023 12:06   MM 3D DIAGNOSTIC MAMMOGRAM UNILATERAL LEFT BREAST Result Date: 05/28/2023 CLINICAL DATA:  LEFT breast mass callback from baseline EXAM: DIGITAL DIAGNOSTIC UNILATERAL LEFT MAMMOGRAM WITH TOMOSYNTHESIS AND CAD; ULTRASOUND LEFT BREAST LIMITED TECHNIQUE: Left digital diagnostic mammography and breast tomosynthesis was performed. The images were evaluated with computer-aided detection. ; Targeted ultrasound examination of the left breast was performed. COMPARISON:  Previous exam(s). ACR Breast Density Category b: There are scattered areas of fibroglandular density. FINDINGS: Spot compression tomosynthesis views demonstrate a persistent low-density oval mass in the LEFT upper outer breast at posterior depth. On physical exam, no suspicious mass is appreciated. Targeted ultrasound was performed of the LEFT upper outer breast. There is an oval circumscribed hypoechoic mass at 2 o'clock 6 cm from the nipple. It measures 6 by 7 by 3 mm. This corresponds well to the site of screening mammographic concern. IMPRESSION: 1. There is a probably benign 7 mm mass at the site of screening mammographic concern. Option for short-term follow-up versus definitive characterization with biopsy was discussed with patient. Patient would prefer to proceed with definitive characterization at this point in time. As such, recommend LEFT breast ultrasound-guided biopsy for definitive characterization. RECOMMENDATION: LEFT breast ultrasound-guided biopsy x1 I have discussed the findings and recommendations with the patient. The biopsy procedure was discussed with  the patient and questions were answered. Patient expressed their  understanding of the biopsy recommendation. Patient will be scheduled for biopsy at her earliest convenience by the schedulers. Ordering provider will be notified. If applicable, a reminder letter will be sent to the patient regarding the next appointment. BI-RADS CATEGORY  3: Probably benign. Electronically Signed   By: Meda Klinefelter M.D.   On: 05/28/2023 13:43   [place] LIMITED ULTRASOUND INCLUDING AXILLA LEFT BREAST  Result Date: 05/28/2023 CLINICAL DATA:  LEFT breast mass callback from baseline EXAM: DIGITAL DIAGNOSTIC UNILATERAL LEFT MAMMOGRAM WITH TOMOSYNTHESIS AND CAD; ULTRASOUND LEFT BREAST LIMITED TECHNIQUE: Left digital diagnostic mammography and breast tomosynthesis was performed. The images were evaluated with computer-aided detection. ; Targeted ultrasound examination of the left breast was performed. COMPARISON:  Previous exam(s). ACR Breast Density Category b: There are scattered areas of fibroglandular density. FINDINGS: Spot compression tomosynthesis views demonstrate a persistent low-density oval mass in the LEFT upper outer breast at posterior depth. On physical exam, no suspicious mass is appreciated. Targeted ultrasound was performed of the LEFT upper outer breast. There is an oval circumscribed hypoechoic mass at 2 o'clock 6 cm from the nipple. It measures 6 by 7 by 3 mm. This corresponds well to the site of screening mammographic concern. IMPRESSION: 1. There is a probably benign 7 mm mass at the site of screening mammographic concern. Option for short-term follow-up versus definitive characterization with biopsy was discussed with patient. Patient would prefer to proceed with definitive characterization at this point in time. As such, recommend LEFT breast ultrasound-guided biopsy for definitive characterization. RECOMMENDATION: LEFT breast ultrasound-guided biopsy x1 I have discussed the findings and recommendations with the patient. The biopsy procedure was discussed with the patient and  questions were answered. Patient expressed their understanding of the biopsy recommendation. Patient will be scheduled for biopsy at her earliest convenience by the schedulers. Ordering provider will be notified. If applicable, a reminder letter will be sent to the patient regarding the next appointment. BI-RADS CATEGORY  3: Probably benign. Electronically Signed   By: Meda Klinefelter M.D.   On: 05/28/2023 13:43   MM 3D SCREENING MAMMOGRAM BILATERAL BREAST Result Date: 05/21/2023 CLINICAL DATA:  Screening. EXAM: DIGITAL SCREENING BILATERAL MAMMOGRAM WITH TOMOSYNTHESIS AND CAD TECHNIQUE: Bilateral screening digital craniocaudal and mediolateral oblique mammograms were obtained. Bilateral screening digital breast tomosynthesis was performed. The images were evaluated with computer-aided detection. COMPARISON:  None available. ACR Breast Density Category b: There are scattered areas of fibroglandular density. FINDINGS: In the left breast, a possible mass warrants further evaluation. In the right breast, no findings suspicious for malignancy. IMPRESSION: Further evaluation is suggested for a possible mass in the left breast. RECOMMENDATION: Diagnostic mammogram and possibly ultrasound of the left breast. (Code:[id number]) The patient will be contacted regarding the findings, and additional imaging will be scheduled. BI-RADS CATEGORY  0: Incomplete: Need additional imaging evaluation. Electronically Signed   By: Baird Lyons M.D.   On: 05/21/2023 10:53    Microbiology: Results for orders placed or performed during the hospital encounter of 06/11/23  Resp panel by RT-PCR (RSV, Flu A&B, Covid) Anterior Nasal Swab     Status: None   Collection Time: 06/11/23  8:13 AM   Specimen: Anterior Nasal Swab  Result Value Ref Range Status   SARS Coronavirus 2 by RT PCR NEGATIVE NEGATIVE Final    Comment: (NOTE) SARS-CoV-2 target nucleic acids are NOT DETECTED.  The SARS-CoV-2 RNA is generally detectable in upper  respiratory specimens during the acute phase  of infection. The lowest concentration of SARS-CoV-2 viral copies this assay can detect is 138 copies/mL. A negative result does not preclude SARS-Cov-2 infection and should not be used as the sole basis for treatment or other patient management decisions. A negative result may occur with  improper specimen collection/handling, submission of specimen other than nasopharyngeal swab, presence of viral mutation(s) within the areas targeted by this assay, and inadequate number of viral copies(<138 copies/mL). A negative result must be combined with clinical observations, patient history, and epidemiological information. The expected result is Negative.  Fact Sheet for Patients:  BloggerCourse.com  Fact Sheet for Healthcare Providers:  SeriousBroker.it  This test is no t yet approved or cleared by the [place] FDA and  has been authorized for detection and/or diagnosis of SARS-CoV-2 by FDA under an Emergency Use Authorization (EUA). This EUA will remain  in effect (meaning this test can be used) for the duration of the COVID-19 declaration under Section 564(b)(1) of the Act, 21 U.S.C.section 360bbb-3(b)(1), unless the authorization is terminated  or revoked sooner.       Influenza A by PCR NEGATIVE NEGATIVE Final   Influenza B by PCR NEGATIVE NEGATIVE Final    Comment: (NOTE) The Xpert Xpress SARS-CoV-2/FLU/RSV plus assay is intended as an aid in the diagnosis of influenza from Nasopharyngeal swab specimens and should not be used as a sole basis for treatment. Nasal washings and aspirates are unacceptable for Xpert Xpress SARS-CoV-2/FLU/RSV testing.  Fact Sheet for Patients: BloggerCourse.com  Fact Sheet for Healthcare Providers: SeriousBroker.it  This test is not yet approved or cleared by the [place] FDA and has been  authorized for detection and/or diagnosis of SARS-CoV-2 by FDA under an Emergency Use Authorization (EUA). This EUA will remain in effect (meaning this test can be used) for the duration of the COVID-19 declaration under Section 564(b)(1) of the Act, 21 U.S.C. section 360bbb-3(b)(1), unless the authorization is terminated or revoked.     Resp Syncytial Virus by PCR NEGATIVE NEGATIVE Final    Comment: (NOTE) Fact Sheet for Patients: BloggerCourse.com  Fact Sheet for Healthcare Providers: SeriousBroker.it  This test is not yet approved or cleared by the [place] FDA and has been authorized for detection and/or diagnosis of SARS-CoV-2 by FDA under an Emergency Use Authorization (EUA). This EUA will remain in effect (meaning this test can be used) for the duration of the COVID-19 declaration under Section 564(b)(1) of the Act, 21 U.S.C. section 360bbb-3(b)(1), unless the authorization is terminated or revoked.  Performed at [facility], [address] [place], [place] [address]     Labs: CBC: Recent Labs  Lab 06/11/23 0813 06/12/23 0135 06/13/23 0519 06/14/23 0356 06/15/23 0417  WBC 22.6* 15.0* 7.6 7.0 6.3  HGB 17.5* 12.5 11.7* 11.7* 12.1  HCT 51.9* 34.9* 32.5* 33.4* 34.6*  MCV 101.6* 95.6 94.8 97.4 98.3  PLT 471* 301 236 217 225   Basic Metabolic Panel: Recent Labs  Lab 06/11/23 0853 06/11/23 2012 06/12/23 0135 06/12/23 1041 06/13/23 0519 06/13/23 1436 06/14/23 0356 06/15/23 0417  NA 131*  --  131*  --  131* 132* 134* 134*  K 2.7*   < > 2.6* 3.2* 2.7* 5.2* 2.8* 3.7  CL 80*  --  95*  --  95* 99 98 98  CO2 19*  --  20*  --  25 22 25 26   GLUCOSE 157*  --  111*  --  80 89 85 80  BUN 10  --  6  --  5* 6 6 7   CREATININE 1.39*  --  0.62  --  0.52 0.56 0.56 0.59  CALCIUM 10.0  --  8.2*  --  8.2* 8.2* 8.4* 8.3*  MG 2.1  --   --  2.1  --   --  1.8 2.4  PHOS  --   --   --   --   --   --  1.8* 3.2   <  > = values in this interval not displayed.   Liver Function Tests: Recent Labs  Lab 06/11/23 0853 06/13/23 0519  AST 83* 62*  ALT 37 25  ALKPHOS 74 39  BILITOT 2.6* 1.1  PROT 9.7* 5.7*  ALBUMIN 5.7* 3.2*   CBG: No results for input(s): "GLUCAP" in the last 168 hours.  Discharge time spent: greater than 30 minutes.  This record has been created using Conservation officer, historic buildings. Errors have been sought and corrected,but may not always be located. Such creation errors do not reflect on the standard of care.   Signed: Arnetha Courser, MD [facility] 06/15/2023

## 2023-06-17 ENCOUNTER — Ambulatory Visit: Admitting: Physician Assistant

## 2023-06-17 VITALS — BP 113/70 | HR 62 | Temp 98.1°F | Ht 62.0 in | Wt 96.6 lb

## 2023-06-17 DIAGNOSIS — F1010 Alcohol abuse, uncomplicated: Secondary | ICD-10-CM | POA: Diagnosis not present

## 2023-06-17 DIAGNOSIS — K2100 Gastro-esophageal reflux disease with esophagitis, without bleeding: Secondary | ICD-10-CM | POA: Diagnosis not present

## 2023-06-17 DIAGNOSIS — I25111 Atherosclerotic heart disease of native coronary artery with angina pectoris with documented spasm: Secondary | ICD-10-CM

## 2023-06-17 DIAGNOSIS — I214 Non-ST elevation (NSTEMI) myocardial infarction: Secondary | ICD-10-CM | POA: Diagnosis not present

## 2023-06-17 MED ORDER — SUCRALFATE 1 GM/10ML PO SUSP: 1.0000 g | Freq: Three times a day (TID) | ORAL | 3 refills | Status: DC

## 2023-06-17 MED ORDER — PANTOPRAZOLE SODIUM 40 MG PO TBEC: 40.0000 mg | DELAYED_RELEASE_TABLET | Freq: Two times a day (BID) | ORAL | 5 refills | Status: DC

## 2023-06-17 NOTE — Progress Notes (Signed)
 Celso Amy, PA-C [address]  [address]  [place], [place] [address]  Main: [phone]  Fax: [phone]   Primary Care Physician: Ailene Ravel, MD  Primary Gastroenterologist:  Celso Amy, PA-C / Dr. Lannette Donath    CC: Hospital f/u Nausea / Vomiting, esophagitis  HPI: Sonya Harmon is a 53 y.o. female presents for hospital follow-up of nausea, vomiting, alcohol abuse, and severe esophagitis.  Admitted to [facility] 06/11/23 - 06/15/23 for NSTEMI, N/V, dehydration, hypokalemia, hyponatremia, rhabdomyolysis, alcohol abuse.  Presented with worsening chest pain, and epigastric pain (x 2-3 months).  CTA chest, abdomen and pelvis with normal aorta, severe mixed aortic atherosclerosis.  Negative for PE.  Showed severe circumferential wall thickening involving the entirely of esophagus consistent with nonspecific infectious or inflammatory esophagitis.  She was started on heparin infusion and cardiology was consulted.  GI was consulted for concern of severe esophagitis and they are recommending PPI, Carafate and possible antifungal treatment if indicated.  No EGD in 6 weeks for the concern of NSTEMI.    Patient was admitted to [facility] end of July 2023 secondary to acute blood loss anemia.  She has history of heavy tobacco use, alcohol use, coronary disease, peripheral artery disease, PCI to RCA in 09/2021, left iliac artery angioplasty in 10/2021 on DAPT.  She was found to have multiple nonbleeding gastric ulcers based on the upper endoscopy on 11/01/2021.  She was started on Prilosec 40 mg twice daily.  Current symptoms: She continues to have moderate heartburn, odynophagia, epigastric pain.  Last drink of alcohol was 06/11/2023 when she went to the hospital.  She reports drinking alcohol for 35 or 40 years.  Typically has 6 or more beers per day.  Currently taking omeprazole 40 Mg daily which is not controlling her reflux.  She had follow-up with her PCP this week and is scheduled for repeat labs  in 2 weeks.   Upper endoscopy 11/01/2021 - Alcoholic duodenitis. - Normal second portion of the duodenum. - Non-obstructing non-bleeding gastric ulcers with a clean ulcer base (Forrest Class III). NSAID induced etiology. There is no evidence of perforation. Biopsied. - Small hiatal hernia. - Normal gastroesophageal junction and esophagus.  03/2023 Colonoscopy by Dr. Allegra Lai: Good prep.  Hemorrhoids, Three (4mm - 5mm) polyps removed.  Diverticulosis.  3 year repeat.  Current Outpatient Medications  Medication Sig Dispense Refill   atorvastatin (LIPITOR) 80 MG tablet TAKE 1 TABLET BY MOUTH EVERY DAY 90 tablet 0   buPROPion (WELLBUTRIN SR) 150 MG 12 hr tablet TAKE 1 TABLET BY MOUTH TWICE A DAY 180 tablet 1   clopidogrel (PLAVIX) 75 MG tablet Take 1 tablet (75 mg total) by mouth daily. 90 tablet 3   escitalopram (LEXAPRO) 10 MG tablet Take 10 mg by mouth daily.     folic acid (FOLVITE) 1 MG tablet Take 1 tablet (1 mg total) by mouth daily. 90 tablet 1   gabapentin (NEURONTIN) 300 MG capsule Take 300 mg by mouth 3 (three) times daily.     metoprolol tartrate (LOPRESSOR) 25 MG tablet Take 1 tablet (25 mg total) by mouth 2 (two) times daily. 60 tablet 2   Multiple Vitamin (MULTIVITAMIN WITH MINERALS) TABS tablet Take 1 tablet by mouth daily. 90 tablet 1   ondansetron (ZOFRAN-ODT) 4 MG disintegrating tablet Take 1 tablet (4 mg total) by mouth every 8 (eight) hours as needed for nausea or vomiting. 20 tablet 0   pantoprazole (PROTONIX) 40 MG tablet Take 1 tablet (40 mg  total) by mouth 2 (two) times daily. 60 tablet 5   thiamine (VITAMIN B-1) 100 MG tablet Take 1 tablet (100 mg total) by mouth daily. 90 tablet 1   varenicline (CHANTIX CONTINUING MONTH PAK) 1 MG tablet Take 1 tablet (1 mg total) by mouth 2 (two) times daily. 60 tablet 1   sucralfate (CARAFATE) 1 GM/10ML suspension Take 10 mLs (1 g total) by mouth 4 (four) times daily -  with meals and at bedtime. 420 mL 3   No current  facility-administered medications for this visit.    Allergies as of 06/17/2023 - Review Complete 06/17/2023  Allergen Reaction Noted   Aspirin Other (See Comments) 12/30/2022   Amoxicill-clarithro-lansopraz Nausea And Vomiting 11/22/2015   Amoxicillin Rash 09/07/2021    Past Medical History:  Diagnosis Date   Alcohol abuse    Coronary artery disease 09/2021   s/p PCI to RCA in setting of unstable angina   History of tongue cancer    PAD (peripheral artery disease) (HCC)    Palpitations 07/15/2017   Paroxysmal sinus tachycardia (HCC) 07/16/2017   Polyneuropathy 12/02/2017   PRES (posterior reversible encephalopathy syndrome) 12/02/2017   Recurrent major depressive disorder (HCC) 01/01/2018   Seizure (HCC) 12/22/2017   SIRS (systemic inflammatory response syndrome) (HCC) 08/01/2017   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   Suicidal ideation 01/01/2018   Tobacco abuse    Unspecified mood (affective) disorder (HCC) 01/01/2018    Past Surgical History:  Procedure Laterality Date   ABDOMINAL AORTOGRAM W/LOWER EXTREMITY N/A 10/15/2021   Procedure: ABDOMINAL AORTOGRAM W/LOWER EXTREMITY;  Surgeon: Iran Ouch, MD;  Location: [facility];  Service: Cardiovascular;  Laterality: N/A;   ABDOMINAL AORTOGRAM W/LOWER EXTREMITY N/A 12/30/2022   Procedure: ABDOMINAL AORTOGRAM W/LOWER EXTREMITY;  Surgeon: Iran Ouch, MD;  Location: [facility];  Service: Cardiovascular;  Laterality: N/A;   BILATERAL SALPINGECTOMY     BREAST BIOPSY Left 06/03/2023   [place] Bx, Heart Clip, Path pending   BREAST BIOPSY Left 06/03/2023   [place] LT BREAST BX W LOC DEV 1ST LESION IMG BX SPEC US GUIDE 06/03/2023 [facility]   COLONOSCOPY WITH PROPOFOL N/A 03/11/2022   Procedure: COLONOSCOPY WITH PROPOFOL;  Surgeon: Toney Reil, MD;  Location: [facility] ENDOSCOPY;  Service: Gastroenterology;  Laterality: N/A;   COLONOSCOPY WITH PROPOFOL N/A 03/18/2023   Procedure:  COLONOSCOPY WITH PROPOFOL;  Surgeon: Toney Reil, MD;  Location: [facility] ENDOSCOPY;  Service: Gastroenterology;  Laterality: N/A;   CORONARY BALLOON ANGIOPLASTY N/A 09/08/2021   Procedure: CORONARY BALLOON ANGIOPLASTY - ABORTED;  Surgeon: Iran Ouch, MD;  Location: [facility] CV [facility];  Service: Cardiovascular;  Laterality: N/A;   CORONARY STENT INTERVENTION N/A 09/10/2021   Procedure: CORONARY STENT INTERVENTION;  Surgeon: Iran Ouch, MD;  Location: [facility];  Service: Cardiovascular;  Laterality: N/A;   ESOPHAGOGASTRODUODENOSCOPY (EGD) WITH PROPOFOL N/A 11/01/2021   Procedure: ESOPHAGOGASTRODUODENOSCOPY (EGD) WITH PROPOFOL;  Surgeon: Toney Reil, MD;  Location: [facility] ENDOSCOPY;  Service: Gastroenterology;  Laterality: N/A;   ESOPHAGOGASTRODUODENOSCOPY (EGD) WITH PROPOFOL N/A 03/11/2022   Procedure: ESOPHAGOGASTRODUODENOSCOPY (EGD) WITH PROPOFOL;  Surgeon: Toney Reil, MD;  Location: [facility] ENDOSCOPY;  Service: Gastroenterology;  Laterality: N/A;   LEFT HEART CATH AND CORONARY ANGIOGRAPHY N/A 09/08/2021   Procedure: LEFT HEART CATH AND CORONARY ANGIOGRAPHY;  Surgeon: Laurier Nancy, MD;  Location: [facility] CV [facility];  Service: Cardiovascular;  Laterality: N/A;   LEFT HEART CATH AND CORONARY ANGIOGRAPHY N/A 06/14/2023  Procedure: LEFT HEART CATH AND CORONARY ANGIOGRAPHY;  Surgeon: Iran Ouch, MD;  Location: [facility];  Service: Cardiovascular;  Laterality: N/A;   PERIPHERAL VASCULAR INTERVENTION  10/15/2021   Procedure: PERIPHERAL VASCULAR INTERVENTION;  Surgeon: Iran Ouch, MD;  Location: [facility];  Service: Cardiovascular;;   POLYPECTOMY  03/18/2023   Procedure: POLYPECTOMY;  Surgeon: Toney Reil, MD;  Location: [facility] ENDOSCOPY;  Service: Gastroenterology;;   TONGUE SURGERY      Review of Systems:    All systems reviewed and negative except where noted in HPI.   Physical Examination:   BP 113/70    Pulse 62   Temp 98.1 F (36.7 C)   Ht 5\' 2"  (1.575 m)   Wt 96 lb 9.6 oz (43.8 kg)   BMI 17.67 kg/m   General: Well-nourished, well-developed in no acute distress.  Lungs: Clear to auscultation bilaterally. Non-labored. Heart: Regular rate and rhythm, no murmurs rubs or gallops.  Abdomen: Bowel sounds are normal; Abdomen is Soft; No hepatosplenomegaly, masses or hernias;  No Abdominal Tenderness; No guarding or rebound tenderness. Neuro: Alert and oriented x 3.  Grossly intact.  Psych: Alert and cooperative, normal mood and affect.   Imaging Studies: CARDIAC CATHETERIZATION Addendum Date: 06/14/2023   Prox RCA lesion is 40% stenosed.   Previously placed Mid RCA stent of unknown type is  widely patent. 1.  Widely patent mid RCA stent with no significant restenosis.  Stable moderate proximal RCA disease. 2.  Left ventricular angiography was not performed.  EF was normal by echo. 3.  Mildly elevated left ventricular end-diastolic pressure at 14 mmHg. Recommendations: No culprit is identified for elevated troponin which is likely due to supply demand mismatch in the setting of severe GI symptoms and electrolyte abnormalities.  Continue medical therapy.  Result Date: 06/14/2023   Prox RCA lesion is 40% stenosed.   Previously placed Mid RCA stent of unknown type is  widely patent. 1.  Widely patent mid RCA stent with no significant restenosis.  Stable moderate proximal RCA disease. 2.  Left ventricular angiography was not performed.  EF was normal by echo. 3.  Mildly elevated left ventricular end-diastolic pressure at 14 mmHg. Recommendations: No culprit is identified for elevated troponin which is likely due to supply demand mismatch in the setting of severe GI symptoms and electrolyte abnormalities.  Continue medical therapy.   ECHOCARDIOGRAM COMPLETE Result Date: 06/12/2023    ECHOCARDIOGRAM REPORT   Patient Name:   Sonya Harmon Date of Exam: 06/12/2023 Medical Rec #:  [id number]      Height:        62.0 in Accession #:    [id number]     Weight:       95.0 lb Date of Birth:  1971-01-03      BSA:          1.393 m Patient Age:    52 years       BP:           144/77 mmHg Patient Gender: F              HR:           67 bpm. Exam Location:  [facility] Procedure: 2D Echo, Cardiac Doppler, Color Doppler and Strain Analysis (Both            Spectral and Color Flow Doppler were utilized during procedure). Indications:     NSTEMI I21.4  History:  Patient has prior history of Echocardiogram examinations, most                  recent 10/29/2022.  Sonographer:     Overton Mam RDCS, FASE Referring Phys:  [id number] Emeline General Diagnosing Phys: Julien Nordmann MD  Sonographer Comments: Global longitudinal strain was attempted. IMPRESSIONS  1. Left ventricular ejection fraction, by estimation, is 60 to 65%. The left ventricle has normal function. The left ventricle has no regional wall motion abnormalities. Left ventricular diastolic parameters are indeterminate. The average left ventricular global longitudinal strain is -21.2 %. The global longitudinal strain is normal.  2. Right ventricular systolic function is normal. The right ventricular size is normal. Tricuspid regurgitation signal is inadequate for assessing PA pressure.  3. The mitral valve is normal in structure. Moderate mitral valve regurgitation. No evidence of mitral stenosis.  4. Tricuspid valve regurgitation is mild to moderate.  5. The aortic valve is tricuspid. Aortic valve regurgitation is not visualized. No aortic stenosis is present.  6. The inferior vena cava is normal in size with greater than 50% respiratory variability, suggesting right atrial pressure of 3 mmHg. FINDINGS  Left Ventricle: Left ventricular ejection fraction, by estimation, is 60 to 65%. The left ventricle has normal function. The left ventricle has no regional wall motion abnormalities. The average left ventricular global longitudinal strain is -21.2 %. Strain was performed and the  global longitudinal strain is normal. The left ventricular internal cavity size was normal in size. There is no left ventricular hypertrophy. Left ventricular diastolic parameters are indeterminate. Right Ventricle: The right ventricular size is normal. No increase in right ventricular wall thickness. Right ventricular systolic function is normal. Tricuspid regurgitation signal is inadequate for assessing PA pressure. Left Atrium: Left atrial size was normal in size. Right Atrium: Right atrial size was normal in size. Pericardium: There is no evidence of pericardial effusion. Mitral Valve: The mitral valve is normal in structure. Moderate mitral valve regurgitation. No evidence of mitral valve stenosis. Tricuspid Valve: The tricuspid valve is normal in structure. Tricuspid valve regurgitation is mild to moderate. No evidence of tricuspid stenosis. Aortic Valve: The aortic valve is tricuspid. Aortic valve regurgitation is not visualized. No aortic stenosis is present. Aortic valve peak gradient measures 7.5 mmHg. Pulmonic Valve: The pulmonic valve was normal in structure. Pulmonic valve regurgitation is not visualized. No evidence of pulmonic stenosis. Aorta: The aortic root is normal in size and structure. Venous: The inferior vena cava is normal in size with greater than 50% respiratory variability, suggesting right atrial pressure of 3 mmHg. IAS/Shunts: No atrial level shunt detected by color flow Doppler. Additional Comments: 3D was performed not requiring image post processing on an independent workstation and was indeterminate.  LEFT VENTRICLE PLAX 2D LVIDd:         4.10 cm   Diastology LVIDs:         2.80 cm   LV e' medial:    10.30 cm/s LV PW:         1.00 cm   LV E/e' medial:  8.0 LV IVS:        1.00 cm   LV e' lateral:   10.70 cm/s LVOT diam:     1.80 cm   LV E/e' lateral: 7.7 LV SV:         61 LV SV Index:   44        2D Longitudinal Strain LVOT Area:     2.54 cm  2D  Strain GLS Avg:     -21.2 %  RIGHT  VENTRICLE RV Basal diam:  2.50 cm RV S prime:     17.10 cm/s TAPSE (M-mode): 2.4 cm LEFT ATRIUM           Index        RIGHT ATRIUM           Index LA diam:      2.80 cm 2.01 cm/m   RA Area:     14.50 cm LA Vol (A2C): 48.2 ml 34.60 ml/m  RA Volume:   38.60 ml  27.71 ml/m LA Vol (A4C): 22.1 ml 15.86 ml/m  AORTIC VALVE                 PULMONIC VALVE AV Area (Vmax): 2.56 cm     PV Vmax:        1.19 m/s AV Vmax:        137.00 cm/s  PV Peak grad:   5.7 mmHg AV Peak Grad:   7.5 mmHg     RVOT Peak grad: 5 mmHg LVOT Vmax:      138.00 cm/s LVOT Vmean:     82.500 cm/s LVOT VTI:       0.241 m  AORTA Ao Root diam: 3.40 cm Ao Asc diam:  3.20 cm MITRAL VALVE MV Area (PHT): 3.66 cm    SHUNTS MV Decel Time: 207 msec    Systemic VTI:  0.24 m MR Peak grad: 86.1 mmHg    Systemic Diam: 1.80 cm MR Mean grad: 56.0 mmHg MR Vmax:      464.00 cm/s MR Vmean:     354.0 cm/s MV E velocity: 82.30 cm/s MV A velocity: 60.30 cm/s MV E/A ratio:  1.36 Julien Nordmann MD Electronically signed by Julien Nordmann MD Signature Date/Time: 06/12/2023/11:58:14 AM    Final    CT Angio Chest/Abd/Pel for Dissection W and/or W/WO Result Date: 06/11/2023 CLINICAL DATA:  Acute aortic syndrome suspected, chest pain, abdominal pain, nausea and vomiting EXAM: CT ANGIOGRAPHY CHEST, ABDOMEN AND PELVIS TECHNIQUE: Non-contrast CT of the chest was initially obtained. Multidetector CT imaging through the chest, abdomen and pelvis was performed using the standard protocol during bolus administration of intravenous contrast. Multiplanar reconstructed images and MIPs were obtained and reviewed to evaluate the vascular anatomy. RADIATION DOSE REDUCTION: This exam was performed according to the departmental dose-optimization program which includes automated exposure control, adjustment of the mA and/or kV according to patient size and/or use of iterative reconstruction technique. CONTRAST:  75mL OMNIPAQUE IOHEXOL 350 MG/ML SOLN COMPARISON:  CT chest, 05/25/2023, CT  abdomen pelvis, 02/21/2023 FINDINGS: CTA CHEST FINDINGS VASCULAR Aorta: Satisfactory opacification of the aorta. Normal caliber of the thoracic aorta. Severe mixed aortic atherosclerosis with a large burden of irregular mural thrombus, predominantly involving the descending thoracic aorta. No evidence of aneurysm, dissection, or other acute aortic pathology. Cardiovascular: No evidence of pulmonary embolism on limited non-tailored examination. Normal heart size. Left and right coronary artery calcifications and stents. No pericardial effusion. Review of the MIP images confirms the above findings. NON VASCULAR Mediastinum/Nodes: No enlarged mediastinal, hilar, or axillary lymph nodes. Severe circumferential wall thickening involving the entirety of the esophagus (series 6, image 104). Thyroid and trachea demonstrate no significant findings. Lungs/Pleura: Minimal centrilobular and paraseptal emphysema. Background of fine centrilobular nodularity throughout the lungs. No pleural effusion or pneumothorax. Musculoskeletal: No chest wall abnormality. No acute osseous findings. Review of the MIP images confirms the above findings. CTA ABDOMEN AND PELVIS FINDINGS VASCULAR  Normal contour and caliber of the abdominal aorta. No evidence of aneurysm, dissection, or other acute aortic pathology. Severe mixed calcific atherosclerosis. Vascular variant direct origin of the hepatic artery from the aorta with otherwise standard branching pattern of the abdominal aorta. Review of the MIP images confirms the above findings. NON-VASCULAR Hepatobiliary: No solid liver abnormality is seen. Hepatic steatosis no gallstones, gallbladder wall thickening, or biliary dilatation. Pancreas: Unremarkable. No pancreatic ductal dilatation or surrounding inflammatory changes. Spleen: Normal in size without significant abnormality. Adrenals/Urinary Tract: Adrenal glands are unremarkable. Kidneys are normal, without renal calculi, solid lesion, or  hydronephrosis. Bladder is unremarkable. Stomach/Bowel: Stomach is within normal limits. Appendix appears normal. No evidence of bowel wall thickening, distention, or inflammatory changes. Lymphatic: No enlarged abdominal or pelvic lymph nodes. Reproductive: No mass or other significant abnormality. Other: No abdominal wall hernia or abnormality. No ascites. Musculoskeletal: No acute osseous findings. IMPRESSION: 1. Normal caliber of the thoracic and abdominal aorta. Severe mixed aortic atherosclerosis, significantly advanced for patient age. No evidence of aneurysm, dissection, or other acute aortic pathology. 2. Severe circumferential wall thickening involving the entirety of the esophagus, consistent with nonspecific infectious or inflammatory esophagitis. 3. Minimal emphysema and smoking-related respiratory bronchiolitis. 4. Coronary artery disease. 5. Hepatic steatosis. Aortic Atherosclerosis (ICD10-I70.0) and Emphysema (ICD10-J43.9). Electronically Signed   By: Jearld Lesch M.D.   On: 06/11/2023 10:38   DG Chest 2 View Result Date: 06/11/2023 CLINICAL DATA:  Chest pain. EXAM: CHEST - 2 VIEW COMPARISON:  February 20, 2023. FINDINGS: The heart size and mediastinal contours are within normal limits. Both lungs are clear. The visualized skeletal structures are unremarkable. IMPRESSION: No active cardiopulmonary disease. Electronically Signed   By: Lupita Raider M.D.   On: 06/11/2023 10:28   CT CHEST LUNG CA SCREEN LOW DOSE W/O CM Result Date: 06/10/2023 CLINICAL DATA:  Lung cancer screening.  Former asymptomatic smoker. EXAM: CT CHEST WITHOUT CONTRAST LOW-DOSE FOR LUNG CANCER SCREENING TECHNIQUE: Multidetector CT imaging of the chest was performed following the standard protocol without IV contrast. RADIATION DOSE REDUCTION: This exam was performed according to the departmental dose-optimization program which includes automated exposure control, adjustment of the mA and/or kV according to patient size  and/or use of iterative reconstruction technique. COMPARISON:  CT angio chest 09/07/2021 FINDINGS: Cardiovascular: Heart size is normal. Aortic atherosclerosis. Coronary artery calcifications. No pericardial effusion. Mediastinum/Nodes: Small nodule/lymph node within the anterior mediastinal measures 0.8 cm, image 25/2. This is unchanged when compared with the previous exam. No enlarged mediastinal or hilar lymph nodes. Thyroid gland and trachea and esophagus are unremarkable. Lungs/Pleura: Mild emphysema with diffuse bronchial wall thickening. No pleural effusion, airspace consolidation, atelectasis or pneumothorax. No suspicious pulmonary nodule or mass identified. Upper Abdomen: No acute abnormality. Musculoskeletal: No chest wall mass or suspicious bone lesions identified. IMPRESSION: 1. Lung-RADS 1, negative. Continue annual screening with low-dose chest CT without contrast in 12 months. 2. Coronary artery calcifications. 3. Aortic Atherosclerosis (ICD10-I70.0) and Emphysema (ICD10-J43.9). Electronically Signed   By: Signa Kell M.D.   On: 06/10/2023 15:26   [place] LT BREAST BX W LOC DEV 1ST LESION IMG BX SPEC US GUIDE Addendum Date: 06/08/2023 ADDENDUM REPORT: 06/08/2023 08:00 ADDENDUM: PATHOLOGY revealed: 1. 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 NEGATIVE FOR ATYPIA OR MALIGNANCY. Pathology results are CONCORDANT with imaging findings, per Dr. Jacob Moores. Pathology results and recommendations were discussed with patient via telephone on 06/04/2023 by Randa Lynn RN. Patient reported  biopsy site doing well with no adverse symptoms, and only slight tenderness at the site. Post biopsy care instructions were reviewed, questions were answered and my direct phone number was provided. Patient was instructed to call [facility] for any additional questions or concerns related to biopsy site. RECOMMENDATION: Patient instructed  to resume annual bilateral screening mammogram due February 2026. Pathology results reported by Randa Lynn RN on 06/04/2023. Electronically Signed   By: Jacob Moores M.D.   On: 06/08/2023 08:00   Result Date: 06/08/2023 CLINICAL DATA:  Indeterminate screen detected mass in the LEFT breast EXAM: ULTRASOUND GUIDED LEFT BREAST CORE NEEDLE BIOPSY COMPARISON:  Previous exam(s). PROCEDURE: I met with the patient and we discussed the procedure of ultrasound-guided biopsy, including benefits and alternatives. We discussed the high likelihood of a successful procedure. We discussed the risks of the procedure, including infection, bleeding, tissue injury, clip migration, and inadequate sampling. Informed written consent was given. The usual time-out protocol was performed immediately prior to the procedure. Lesion quadrant: Upper outer quadrant Using sterile technique and 1% Lidocaine as local anesthetic, under direct ultrasound visualization, a 14 gauge spring-loaded device was used to perform biopsy of a mass in the left breast 2 o'clock position 6 cm from the nipple using an inferior approach. At the conclusion of the procedure, a heart shaped tissue marker clip was deployed into the biopsy cavity. Follow up 2 view mammogram was performed and dictated separately. IMPRESSION: Ultrasound guided biopsy of a mass in the left breast 2 o'clock position 6 cm from the nipple. No apparent complications. Electronically Signed: By: Jacob Moores M.D. On: 06/03/2023 12:03   MM CLIP PLACEMENT LEFT Result Date: 06/03/2023 CLINICAL DATA:  Status post ultrasound-guided biopsy of an indeterminate screen detected mass in the left breast EXAM: 3D DIAGNOSTIC LEFT MAMMOGRAM POST ULTRASOUND BIOPSY COMPARISON:  Previous exam(s). ACR Breast Density Category b: There are scattered areas of fibroglandular density. FINDINGS: 3D Mammographic images were obtained following ultrasound guided biopsy of a mass in the left breast 2 o'clock  position 6 cm from the nipple. The heart shaped biopsy marking clip is in expected position at the site of biopsy. IMPRESSION: Appropriate positioning of the heart shaped biopsy marking clip at the site of biopsy in the left breast 2 o'clock position 6 cm from the nipple. Final Assessment: Post Procedure Mammograms for Marker Placement Electronically Signed   By: Jacob Moores M.D.   On: 06/03/2023 12:06   MM 3D DIAGNOSTIC MAMMOGRAM UNILATERAL LEFT BREAST Result Date: 05/28/2023 CLINICAL DATA:  LEFT breast mass callback from baseline EXAM: DIGITAL DIAGNOSTIC UNILATERAL LEFT MAMMOGRAM WITH TOMOSYNTHESIS AND CAD; ULTRASOUND LEFT BREAST LIMITED TECHNIQUE: Left digital diagnostic mammography and breast tomosynthesis was performed. The images were evaluated with computer-aided detection. ; Targeted ultrasound examination of the left breast was performed. COMPARISON:  Previous exam(s). ACR Breast Density Category b: There are scattered areas of fibroglandular density. FINDINGS: Spot compression tomosynthesis views demonstrate a persistent low-density oval mass in the LEFT upper outer breast at posterior depth. On physical exam, no suspicious mass is appreciated. Targeted ultrasound was performed of the LEFT upper outer breast. There is an oval circumscribed hypoechoic mass at 2 o'clock 6 cm from the nipple. It measures 6 by 7 by 3 mm. This corresponds well to the site of screening mammographic concern. IMPRESSION: 1. There is a probably benign 7 mm mass at the site of screening mammographic concern. Option for short-term follow-up versus definitive characterization with biopsy was discussed with patient. Patient  would prefer to proceed with definitive characterization at this point in time. As such, recommend LEFT breast ultrasound-guided biopsy for definitive characterization. RECOMMENDATION: LEFT breast ultrasound-guided biopsy x1 I have discussed the findings and recommendations with the patient. The biopsy  procedure was discussed with the patient and questions were answered. Patient expressed their understanding of the biopsy recommendation. Patient will be scheduled for biopsy at her earliest convenience by the schedulers. Ordering provider will be notified. If applicable, a reminder letter will be sent to the patient regarding the next appointment. BI-RADS CATEGORY  3: Probably benign. Electronically Signed   By: Meda Klinefelter M.D.   On: 05/28/2023 13:43   [place] LIMITED ULTRASOUND INCLUDING AXILLA LEFT BREAST  Result Date: 05/28/2023 CLINICAL DATA:  LEFT breast mass callback from baseline EXAM: DIGITAL DIAGNOSTIC UNILATERAL LEFT MAMMOGRAM WITH TOMOSYNTHESIS AND CAD; ULTRASOUND LEFT BREAST LIMITED TECHNIQUE: Left digital diagnostic mammography and breast tomosynthesis was performed. The images were evaluated with computer-aided detection. ; Targeted ultrasound examination of the left breast was performed. COMPARISON:  Previous exam(s). ACR Breast Density Category b: There are scattered areas of fibroglandular density. FINDINGS: Spot compression tomosynthesis views demonstrate a persistent low-density oval mass in the LEFT upper outer breast at posterior depth. On physical exam, no suspicious mass is appreciated. Targeted ultrasound was performed of the LEFT upper outer breast. There is an oval circumscribed hypoechoic mass at 2 o'clock 6 cm from the nipple. It measures 6 by 7 by 3 mm. This corresponds well to the site of screening mammographic concern. IMPRESSION: 1. There is a probably benign 7 mm mass at the site of screening mammographic concern. Option for short-term follow-up versus definitive characterization with biopsy was discussed with patient. Patient would prefer to proceed with definitive characterization at this point in time. As such, recommend LEFT breast ultrasound-guided biopsy for definitive characterization. RECOMMENDATION: LEFT breast ultrasound-guided biopsy x1 I have discussed the findings  and recommendations with the patient. The biopsy procedure was discussed with the patient and questions were answered. Patient expressed their understanding of the biopsy recommendation. Patient will be scheduled for biopsy at her earliest convenience by the schedulers. Ordering provider will be notified. If applicable, a reminder letter will be sent to the patient regarding the next appointment. BI-RADS CATEGORY  3: Probably benign. Electronically Signed   By: Meda Klinefelter M.D.   On: 05/28/2023 13:43   MM 3D SCREENING MAMMOGRAM BILATERAL BREAST Result Date: 05/21/2023 CLINICAL DATA:  Screening. EXAM: DIGITAL SCREENING BILATERAL MAMMOGRAM WITH TOMOSYNTHESIS AND CAD TECHNIQUE: Bilateral screening digital craniocaudal and mediolateral oblique mammograms were obtained. Bilateral screening digital breast tomosynthesis was performed. The images were evaluated with computer-aided detection. COMPARISON:  None available. ACR Breast Density Category b: There are scattered areas of fibroglandular density. FINDINGS: In the left breast, a possible mass warrants further evaluation. In the right breast, no findings suspicious for malignancy. IMPRESSION: Further evaluation is suggested for a possible mass in the left breast. RECOMMENDATION: Diagnostic mammogram and possibly ultrasound of the left breast. (Code:[id number]) The patient will be contacted regarding the findings, and additional imaging will be scheduled. BI-RADS CATEGORY  0: Incomplete: Need additional imaging evaluation. Electronically Signed   By: Baird Lyons M.D.   On: 05/21/2023 10:53    Assessment and Plan:   Sonya Harmon is a 53 y.o. y/o female presents for hospital follow-up of:  Severe Esophagitis, most likely secondary to alcohol abuse GERD Alcohol Abuse NSTEMI - 06/11/2023; currently on aspirin and Plavix  Plan: -Rx pantoprazole 40 Mg 1  tablet twice daily, #60, 5 refills. -Rx Carafate 10 mL 4 times daily, 2 refills. -Lengthy discussion  advising cessation of all alcohol.  Discussed resources such as Alcoholics Anonymous.  She has attended AA meetings in the past. -Recommend Lifestyle Modifications to prevent Acid Reflux.  Rec. Avoid coffee, sodas, peppermint, garlic, onions, alcohol, citrus fruits, chocolate, tomatoes, fatty and spicey foods.  Avoid eating 2-3 hours before bedtime.   -Recommend bland diet.  Drink milk, water, Powerade, and Gatorade for electrolytes. -Tentatively scheduled follow-up EGD in 6 weeks. -I will also discuss case with Dr. Allegra Lai for further recommendations. -She is scheduled for repeat labs in 2 weeks through her PCP.  Celso Amy, PA-C  Follow up with Dr. Allegra Lai in 4 weeks.

## 2023-06-21 ENCOUNTER — Encounter: Payer: Self-pay | Admitting: Cardiovascular Disease

## 2023-06-21 DIAGNOSIS — K2100 Gastro-esophageal reflux disease with esophagitis, without bleeding: Secondary | ICD-10-CM

## 2023-06-22 ENCOUNTER — Other Ambulatory Visit: Payer: Self-pay

## 2023-06-22 ENCOUNTER — Emergency Department

## 2023-06-22 ENCOUNTER — Encounter: Payer: Self-pay | Admitting: Emergency Medicine

## 2023-06-22 ENCOUNTER — Emergency Department
Admission: EM | Admit: 2023-06-22 | Discharge: 2023-06-22 | Disposition: A | Discharge status: Home / Self Care | Attending: Emergency Medicine | Admitting: Emergency Medicine

## 2023-06-22 ENCOUNTER — Telehealth: Payer: Self-pay | Admitting: Medical

## 2023-06-22 DIAGNOSIS — K209 Esophagitis, unspecified without bleeding: Secondary | ICD-10-CM | POA: Diagnosis not present

## 2023-06-22 DIAGNOSIS — I2510 Atherosclerotic heart disease of native coronary artery without angina pectoris: Secondary | ICD-10-CM | POA: Insufficient documentation

## 2023-06-22 DIAGNOSIS — R1013 Epigastric pain: Secondary | ICD-10-CM | POA: Diagnosis not present

## 2023-06-22 DIAGNOSIS — R0789 Other chest pain: Secondary | ICD-10-CM

## 2023-06-22 DIAGNOSIS — R079 Chest pain, unspecified: Secondary | ICD-10-CM | POA: Diagnosis not present

## 2023-06-22 LAB — TROPONIN I (HIGH SENSITIVITY)
Troponin I (High Sensitivity): 16 ng/L (ref <18)
Troponin I (High Sensitivity): 19 ng/L — ABNORMAL HIGH (ref <18)

## 2023-06-22 LAB — HEPATIC FUNCTION PANEL
ALT: 17 U/L (ref 0–44)
AST: 26 U/L (ref 15–41)
Albumin: 3.8 g/dL (ref 3.5–5.0)
Alkaline Phosphatase: 42 U/L (ref 38–126)
Bilirubin, Direct: 0.1 mg/dL (ref 0.0–0.2)
Indirect Bilirubin: 0.7 mg/dL (ref 0.3–0.9)
Total Bilirubin: 0.8 mg/dL (ref 0.0–1.2)
Total Protein: 6.8 g/dL (ref 6.5–8.1)

## 2023-06-22 LAB — CBC
HCT: 38.9 % (ref 36.0–46.0)
Hemoglobin: 13.5 g/dL (ref 12.0–15.0)
MCH: 33.8 pg (ref 26.0–34.0)
MCHC: 34.7 g/dL (ref 30.0–36.0)
MCV: 97.5 fL (ref 80.0–100.0)
Platelets: 320 10*3/uL (ref 150–400)
RBC: 3.99 MIL/uL (ref 3.87–5.11)
RDW: 13.5 % (ref 11.5–15.5)
WBC: 6.6 10*3/uL (ref 4.0–10.5)
nRBC: 0 % (ref 0.0–0.2)

## 2023-06-22 LAB — BASIC METABOLIC PANEL
Anion gap: 12 (ref 5–15)
BUN: 9 mg/dL (ref 6–20)
CO2: 24 mmol/L (ref 22–32)
Calcium: 9 mg/dL (ref 8.9–10.3)
Chloride: 99 mmol/L (ref 98–111)
Creatinine, Ser: 0.7 mg/dL (ref 0.44–1.00)
GFR, Estimated: >60 mL/min (ref >60)
Glucose, Bld: 82 mg/dL (ref 70–99)
Potassium: 3.5 mmol/L (ref 3.5–5.1)
Sodium: 135 mmol/L (ref 135–145)

## 2023-06-22 LAB — LIPASE, BLOOD: Lipase: 46 U/L (ref 11–51)

## 2023-06-22 MED ORDER — ALUM & MAG HYDROXIDE-SIMETH 200-200-20 MG/5ML PO SUSP
30.0000 mL | Freq: Once | ORAL | Status: AC
  Administered 2023-06-22: 30 mL via ORAL
  Filled 2023-06-22: qty 30

## 2023-06-22 MED ORDER — SUCRALFATE 1 G PO TABS
1.0000 g | ORAL_TABLET | Freq: Once | ORAL | Status: AC
  Administered 2023-06-22: 1 g via ORAL
  Filled 2023-06-22: qty 1

## 2023-06-22 MED ORDER — LIDOCAINE VISCOUS HCL 2 % MT SOLN
15.0000 mL | Freq: Once | OROMUCOSAL | Status: AC
  Administered 2023-06-22: 15 mL via ORAL
  Filled 2023-06-22: qty 15

## 2023-06-22 MED ORDER — ASPIRIN 81 MG PO CHEW
324.0000 mg | CHEWABLE_TABLET | Freq: Once | ORAL | Status: AC
  Administered 2023-06-22: 324 mg via ORAL
  Filled 2023-06-22: qty 4

## 2023-06-22 NOTE — ED Triage Notes (Signed)
 Pt to ED via POV for chest pain that has been ongoing since her last visit. Pt states that 2 weeks ago she had a heart attack. Pt states that the pain is constant. Pt is currently in NAD.

## 2023-06-22 NOTE — ED Notes (Signed)
 See triage note  Presents with some chest /epigastric pain  Describes as "burning"  Worse with eating and swallowing States this has been going on for the past 2 weeks

## 2023-06-22 NOTE — ED Provider Notes (Signed)
 Triage ECG is interpreted by me. 9:45 AM Heart rate 80 QRS 80 QTc 470 Mild prolongation of QT interval.  Repolarization like abnormality versus subtle ST segment elevation is noted in multiple leads.  Does not meet STEMI criteria.  Compared with previous ECGs the morphology appears somewhat similar though not exactly the same.  Discussed with triage nurse, patient being prioritized bed placement, cardiac monitoring.  Cardiac panel including troponin to be sent.  At this time I do not see evidence to activate STEMI but certainly would consider the possibility that cardiac ischemia cannot be excluded   Sharyn Creamer, MD 06/22/23 [phone]

## 2023-06-22 NOTE — Telephone Encounter (Signed)
 Pt requesting cb to discuss to discuss ED visit today. Upset stating that they did not do anything for her.

## 2023-06-22 NOTE — ED Provider Notes (Signed)
 [facility] Provider Note    Event Date/Time   First MD Initiated Contact with Patient 06/22/23 [phone]     (approximate)   History   Chest Pain   HPI  Sonya Harmon is a 53 y.o. female with history of CAD, seizures, PAD, history of alcohol abuse, here for epigastric burning that into her chest into her throat.  States that she has had nausea.  Unable to take any of her medications due to the burning.  States that she has not taken her aspirin or Plavix for the past week.  States her symptoms persisted from the time she was discharged.  Able to get some fluids down but not able to really tolerate food due to the burning.  No vomiting, states that she has some shortness of breath when the burning is severe.  No diarrhea, no cough or fever.  No urinary symptoms.  Chart review she was admitted for an NSTEMI in mid March, had a left heart cath that was done that showed a stable proximal RCA lesion is 40% stenosed, had a previous mid RCA stent that is widely patent.  Secondary to possible esophagitis and she was discharged with PPI, Carafate.  Follow-up with GI on 13 March, symptoms are thought secondary to severe esophagitis, continue her on PPI twice daily, Carafate, or scheduling her for EGD in 6 weeks.     Physical Exam   Triage Vital Signs: ED Triage Vitals  Encounter Vitals Group     BP 06/22/23 0948 (!) 143/89     Systolic BP Percentile --      Diastolic BP Percentile --      Pulse Rate 06/22/23 0948 73     Resp 06/22/23 0948 18     Temp 06/22/23 0948 97.7 F (36.5 C)     Temp src --      SpO2 06/22/23 0948 100 %     Weight 06/22/23 0952 94 lb 12.8 oz (43 kg)     Height 06/22/23 0952 5\' 2"  (1.575 m)     Head Circumference --      Peak Flow --      Pain Score 06/22/23 0952 8     Pain Loc --      Pain Education --      Exclude from Growth Chart --     Most recent vital signs: Vitals:   06/22/23 1244 06/22/23 1404  BP: (!) 132/93 130/88  Pulse:  65 65  Resp: 18 18  Temp:  98 F (36.7 C)  SpO2: 100% 100%     General: Awake, no distress.  CV:  Good peripheral perfusion. Resp:  Normal effort.  No increased work of breathing Abd:  No distention.  Soft nontender Other:  No lower extremity edema.   ED Results / Procedures / Treatments   Labs (all labs ordered are listed, but only abnormal results are displayed) Labs Reviewed  TROPONIN I (HIGH SENSITIVITY) - Abnormal; Notable for the following components:      Result Value   Troponin I (High Sensitivity) 19 (*)    All other components within normal limits  BASIC METABOLIC PANEL  CBC  HEPATIC FUNCTION PANEL  LIPASE, BLOOD  TROPONIN I (HIGH SENSITIVITY)     EKG  EKG shows sinus rhythm, rate 80, normal QRS, normal QTc, T wave inversion to aVL, she does have some mild 3.5 mm ST elevation to inferior leads as well as V4 through V6, her baseline is  wandering.  Does not meet STEMI criteria.  Subtle ST elevations are seen in prior EKG inferior leads on March 8.  Will get repeat EKGs.  Repeat EKG showed sinus rhythm, rate 66, normal QRS, normal QTc, she has 1 mm ST elevation to 2, 3, aVF, there are no reciprocal changes, this is slightly changed compared to prior but her ST elevations look consistent with early repolarization.  These changes are similar to EKGs from June which also showed early repolarization to inferior lateral leads.   RADIOLOGY Chest x-ray on my interpretation without obvious consolidation   PROCEDURES:  Critical Care performed: No  Procedures   MEDICATIONS ORDERED IN ED: Medications  aspirin chewable tablet 324 mg (324 mg Oral Given 06/22/23 1039)  alum & mag hydroxide-simeth (MAALOX/MYLANTA) 200-200-20 MG/5ML suspension 30 mL (30 mLs Oral Given 06/22/23 1039)    And  lidocaine (XYLOCAINE) 2 % viscous mouth solution 15 mL (15 mLs Oral Given 06/22/23 1039)  sucralfate (CARAFATE) tablet 1 g (1 g Oral Given 06/22/23 1105)     IMPRESSION / MDM /  ASSESSMENT AND PLAN / ED COURSE  I reviewed the triage vital signs and the nursing notes.                              Differential diagnosis includes, but is not limited to, esophagitis, GERD, acid reflux, ulcer disease, ACS, angina  Patient's presentation is most consistent with acute presentation with potential threat to life or bodily function.  Consulted cardiology about EKG changes, they agreed with me that this is likely early repolarization and not ST elevation MI.  Recommended repeat troponin.  Repeat troponin is 19, delta is 3.  Patient is tolerating p.o.  Shared decision making done with patient and she is agreeable plan for discharge, discuss switching medications over to either chewable forms of liquid but she said that she will follow-up with her primary care doctor to further discuss it.  Discussed with patient about keeping herself hydrated and taking Ensure, Pedialyte, Gatorade, milk to help with her nutrition.  Considered but no indication for inpatient admission at this time, she is safe for outpatient management.  Discharged with strict return precautions.  Clinical Course as of 06/22/23 1443  Tue Jun 22, 2023  1135 Independent review of labs, electrolytes not severely deranged, LFTs are normal, lipase is normal, no leukocytosis, troponin is normal. [TT]  1150 On reassessment patient is feeling significantly better after meds. [TT]  1150 DG Chest 2 View No active cardiopulmonary disease.  [TT]    Clinical Course User Index [TT] Jodie Echevaria, Franchot Erichsen, MD     FINAL CLINICAL IMPRESSION(S) / ED DIAGNOSES   Final diagnoses:  Esophagitis  Epigastric burning sensation  Burning chest pain     Rx / DC Orders   ED Discharge Orders     None        Note:  This document was prepared using Dragon voice recognition software and may include unintentional dictation errors.    Claybon Jabs, MD 06/22/23 [phone]

## 2023-06-22 NOTE — Telephone Encounter (Signed)
 Left message for pt to call.

## 2023-06-22 NOTE — Discharge Instructions (Addendum)
 Please take the medications as prescribed for your esophagitis.   Please talk to your primary care doctor about switching your medications over to chewable form to liquid forms to help with you taking them.  Please return if you have recurrent or persistent symptoms.

## 2023-06-24 ENCOUNTER — Other Ambulatory Visit: Payer: Self-pay

## 2023-06-25 ENCOUNTER — Telehealth: Payer: Self-pay | Admitting: Cardiovascular Disease

## 2023-06-25 MED ORDER — PANTOPRAZOLE SODIUM 40 MG PO TBEC: 40.0000 mg | DELAYED_RELEASE_TABLET | Freq: Two times a day (BID) | ORAL | 1 refills | Status: DC

## 2023-06-25 NOTE — Telephone Encounter (Signed)
   Pre-operative Risk Assessment    Patient Name: Sonya Harmon  DOB: [birth year] MRN: [id number]   Date of last office visit: unknown Date of next office visit: unknown   Request for Surgical Clearance    Procedure:   EGD   Date of Surgery:  Clearance 08/18/23                                Surgeon:  not indicated Surgeon's Group or Practice Name:  [facility] Phone number:  [phone] Fax number:  [phone]   Type of Clearance Requested:   - Pharmacy:  Hold Clopidogrel (Plavix) not indicated   Type of Anesthesia:  Not Indicated   Additional requests/questions:    [name]   06/25/2023, 8:36 AM

## 2023-06-25 NOTE — Telephone Encounter (Signed)
 Called and spoke with patient. Notified her of the following from Dr. Mariah Milling.  Can we confirm she has her Carafate and she is using it every 4 hours  Also perhaps we can change her omeprazole 40 twice daily to pantoprazole 40 twice daily  Appears she is scheduled for EGD with GI in May  Thx  TGollan   Patient verbalizes understanding.Prescription sent to preferred pharmacy.

## 2023-06-25 NOTE — Telephone Encounter (Signed)
 Called patient, she states she is feeling better today- she was made aware of her follow up on 03/27 with PA. Aware to keep this appointment to discuss chest discomfort she was having.  Patient verbalized understanding, thankful for call back.

## 2023-06-25 NOTE — Telephone Encounter (Signed)
   Name: Sonya Harmon  DOB: April 01, 1971  MRN: [id number]  Primary Cardiologist: Julien Nordmann, MD  Chart reviewed as part of pre-operative protocol coverage. The patient has an upcoming visit scheduled with Cadence Furth, PA on  07/01/2023 at which time clearance can be addressed in case there are any issues that would impact surgical recommendations.  EGD is not scheduled until 08/18/2023 as below. I added preop FYI to appointment note so that provider is aware to address at time of outpatient visit.  Per office protocol the cardiology provider should forward their finalized clearance decision and recommendations regarding antiplatelet therapy to the requesting party below.    I will route this message as FYI to requesting party and remove this message from the preop box as separate preop APP input not needed at this time.   Please call with any questions.  Joylene Grapes, NP  06/25/2023, 8:57 AM

## 2023-06-28 DIAGNOSIS — Z79899 Other long term (current) drug therapy: Secondary | ICD-10-CM | POA: Diagnosis not present

## 2023-06-28 NOTE — Telephone Encounter (Signed)
 We reschedule patient EGD to 07/20/2023 with Dr. Allegra Lai

## 2023-06-28 NOTE — Telephone Encounter (Signed)
 Patient is not cleared to have the EGD yet. She does not see cardiology to 07/01/2023. Do you want me to move the EGD up to when you come back. Moved it to 07/20/2023 in [place]

## 2023-07-01 ENCOUNTER — Ambulatory Visit: Attending: Medical | Admitting: Medical

## 2023-07-01 ENCOUNTER — Encounter: Payer: Self-pay | Admitting: Medical

## 2023-07-01 VITALS — BP 126/72 | HR 75 | Ht 62.0 in | Wt 96.8 lb

## 2023-07-01 DIAGNOSIS — Z79899 Other long term (current) drug therapy: Secondary | ICD-10-CM

## 2023-07-01 DIAGNOSIS — Z87898 Personal history of other specified conditions: Secondary | ICD-10-CM | POA: Diagnosis not present

## 2023-07-01 DIAGNOSIS — K2100 Gastro-esophageal reflux disease with esophagitis, without bleeding: Secondary | ICD-10-CM | POA: Diagnosis not present

## 2023-07-01 DIAGNOSIS — I2510 Atherosclerotic heart disease of native coronary artery without angina pectoris: Secondary | ICD-10-CM | POA: Diagnosis not present

## 2023-07-01 DIAGNOSIS — Z01818 Encounter for other preprocedural examination: Secondary | ICD-10-CM

## 2023-07-01 DIAGNOSIS — Z720 Tobacco use: Secondary | ICD-10-CM

## 2023-07-01 DIAGNOSIS — I214 Non-ST elevation (NSTEMI) myocardial infarction: Secondary | ICD-10-CM | POA: Diagnosis not present

## 2023-07-01 NOTE — Patient Instructions (Addendum)
 Medication Instructions:  Your physician recommends that you continue on your current medications as directed. Please refer to the Current Medication list given to you today.   *If you need a refill on your cardiac medications before your next appointment, please call your pharmacy*  Your provider would like for you to have following labs drawn today (CBC).    Testing/Procedures: No test ordered today   Follow-Up: At [facility], you and your health needs are our priority.  As part of our continuing mission to provide you with exceptional heart care, our providers are all part of one team.  This team includes your primary Cardiologist (physician) and Advanced Practice Providers or APPs (Physician Assistants and Nurse Practitioners) who all work together to provide you with the care you need, when you need it.  Your next appointment:   September 30, 2023 @ 9:40 am  Provider:   Lorine Bears, MD    We recommend signing up for the patient portal called "MyChart".  Sign up information is provided on this After Visit Summary.  MyChart is used to connect with patients for Virtual Visits (Telemedicine).  Patients are able to view lab/test results, encounter notes, upcoming appointments, etc.  Non-urgent messages can be sent to your provider as well.   To learn more about what you can do with MyChart, go to [email or web address].

## 2023-07-01 NOTE — Progress Notes (Signed)
 Cardiology Office Note:  .   Date:  07/08/2023  ID:  MAREN Harmon, DOB 1970-10-20, MRN [id number] PCP: Ailene Ravel, MD  [facility] Providers Cardiologist:  Julien Nordmann, MD     History of Present Illness: .   Sonya Harmon is a 53 y.o. female CAD, PAD, tongue cancer, tobacco use, alcohol abuse, hyperlipidemia who is being seen for hospital follow-up for chest pain.  The patient was hospitalized in 09/2021 with unstable angina.  Patient underwent cardiac cath by Dr. Lennette Bihari which showed severe mid RCA stenosis.  PCI was planned but patient had a large groin hematoma.  She was noted to have significant bilateral iliac disease.  She underwent staged RCA PCI via the right radial artery.She has significant bilateral leg cardiac calcification due to iliac disease.  Enterography 10/2021 showed severely calcified but her aorta with no obstructive disease.  There is severe ostial left renal artery stenosis, no significant iliac disease on the right side and severe left external iliac artery stenosis extending into the distal left common iliac artery.  Successful DES placement to the left external iliac artery and drug-coated balloon angioplasty to the distal left common iliac artery without stent placement.   She was admitted for NSTEMI, N/V/esophagitis, HTN, HL, and AKI.  CTA cehst showed severe esophagitis. Echo showed normal LVEF, no WMA. Cardiac cath showed nonobstructive disease. She was discharged on Plavix, statin and BB.   Today, she is needing endoscopy for esophagitis. She has lost about 10lbs because she can't eat or drink. She is doing a little better, but still has severe chest burning. Even drinking water hurts. Milk soothes the pain. Also has stabbing pain in the lower back. She quit drinking alcohol after the hospitalization. She also quit smoking.   Studies Reviewed: Marland Kitchen   EKG Interpretation Date/Time:  Thursday July 01 2023 15:01:10 EDT Ventricular Rate:  75 PR  Interval:  162 QRS Duration:  68 QT Interval:  418 QTC Calculation: 466 R Axis:   17  Text Interpretation: Sinus rhythm with occasional Premature ventricular complexes minimal STE inferolateral leads, similar to prior TWI V1 and V2 When compared with ECG of 22-Jun-2023 11:57, Confirmed by Fransico Michael, Aerith Canal ([id number]) on 07/01/2023 4:42:52 PM    [facility] 06/2023   Prox RCA lesion is 40% stenosed.   Previously placed Mid RCA stent of unknown type is  widely patent.   1.  Widely patent mid RCA stent with no significant restenosis.  Stable moderate proximal RCA disease. 2.  Left ventricular angiography was not performed.  EF was normal by echo. 3.  Mildly elevated left ventricular end-diastolic pressure at 14 mmHg.   Recommendations: No culprit is identified for elevated troponin which is likely due to supply demand mismatch in the setting of severe GI symptoms and electrolyte abnormalities.  Continue medical therapy.    Echo 06/2023 1. Left ventricular ejection fraction, by estimation, is 60 to 65%. The  left ventricle has normal function. The left ventricle has no regional  wall motion abnormalities. Left ventricular diastolic parameters are  indeterminate. The average left  ventricular global longitudinal strain is -21.2 %. The global longitudinal  strain is normal.   2. Right ventricular systolic function is normal. The right ventricular  size is normal. Tricuspid regurgitation signal is inadequate for assessing  PA pressure.   3. The mitral valve is normal in structure. Moderate mitral valve  regurgitation. No evidence of mitral stenosis.   4. Tricuspid valve regurgitation is mild to moderate.  5. The aortic valve is tricuspid. Aortic valve regurgitation is not  visualized. No aortic stenosis is present.   6. The inferior vena cava is normal in size with greater than 50%  respiratory variability, suggesting right atrial pressure of 3 mmHg.   Physical Exam:   VS:  BP 126/72   Pulse 75    Ht 5\' 2"  (1.575 m)   Wt 96 lb 12.8 oz (43.9 kg)   SpO2 99%   BMI 17.70 kg/m    Wt Readings from Last 3 Encounters:  07/01/23 96 lb 12.8 oz (43.9 kg)  06/22/23 94 lb 12.8 oz (43 kg)  06/17/23 96 lb 9.6 oz (43.8 kg)    GEN: Well nourished, well developed in no acute distress NECK: No JVD; No carotid bruits CARDIAC: RRR, + murmur, no rubs, gallops RESPIRATORY:  Clear to auscultation without rales, wheezing or rhonchi  ABDOMEN: Soft, non-tender, non-distended EXTREMITIES:  No edema; No deformity   ASSESSMENT AND PLAN: .    NSTEMI CAD with PCI/DES mRCA 2023 Recent admission for NSTEMI with N/V/esophagitis. Cardiac cath showed nonobstructive disease. Cath site is stable. She has persistent acid-reflux/esophagitis pain that keeps her from eating. I will check inflammatory markers to be complete.  No further cardiac work-up at this time. Continue Plavix, Lipitor and Lopressor.I will refer her to Cardiac rehab.   Esophagitis Plan for EGD per GI.  Alcohol use She quit drinking alcohol since discharge.  Tobacco use She quit smoking, she was congratulated.   Pre-op No further cardiac work-up required prior to EGD. METS>4. According to revised cardiac index she is low risk at 6% risk of MACE. Notes suggest holding Plavix is not indicated.        Dispo: Follow-up in 1 month  Signed, Laureen Frederic David Stall, PA-C

## 2023-07-02 LAB — CBC
Hematocrit: 37.1 % (ref 34.0–46.6)
Hemoglobin: 12.8 g/dL (ref 11.1–15.9)
MCH: 33.3 pg — ABNORMAL HIGH (ref 26.6–33.0)
MCHC: 34.5 g/dL (ref 31.5–35.7)
MCV: 97 fL (ref 79–97)
Platelets: 536 10*3/uL — ABNORMAL HIGH (ref 150–450)
RBC: 3.84 x10E6/uL (ref 3.77–5.28)
RDW: 13 % (ref 11.7–15.4)
WBC: 7.5 10*3/uL (ref 3.4–10.8)

## 2023-07-05 ENCOUNTER — Other Ambulatory Visit: Payer: Self-pay

## 2023-07-05 DIAGNOSIS — R079 Chest pain, unspecified: Secondary | ICD-10-CM

## 2023-07-06 NOTE — Telephone Encounter (Signed)
 Patient was seen on 07/05/2023

## 2023-07-07 ENCOUNTER — Telehealth: Payer: Self-pay

## 2023-07-07 DIAGNOSIS — R079 Chest pain, unspecified: Secondary | ICD-10-CM | POA: Diagnosis not present

## 2023-07-07 NOTE — Telephone Encounter (Signed)
   [facility] Pre-operative Risk Assessment    Request for surgical clearance:  What type of surgery is being performed? EGD   When is this surgery scheduled? 07/20/2023   Are there any medications that need to be held prior to surgery and how long?Plavix   Practice name and name of physician performing surgery? [facility] Dr. Allegra Lai    What is your office phone and fax number? [phone]   Anesthesia type (None, local, MAC, general) ? General    Sonya Harmon 07/07/2023, 9:21 AM  _________________________________________________________________   (provider comments below)

## 2023-07-08 LAB — SEDIMENTATION RATE: Sed Rate: 3 mm/h (ref 0–40)

## 2023-07-08 LAB — C-REACTIVE PROTEIN: CRP: <1 mg/L (ref 0–10)

## 2023-07-08 NOTE — Telephone Encounter (Signed)
   Name: Sonya Harmon  DOB: 01-May-1970  MRN: [id number]   Primary Cardiologist: Julien Nordmann, MD  Chart reviewed as part of pre-operative protocol coverage. Sonya Harmon was last seen on 07/01/2023 by Cadence Furth, PA-C.  Per office visit note "No further cardiac work-up required prior to EGD."  Therefore, based on ACC/AHA guidelines, the patient would be an acceptable risk for the planned procedure without further cardiovascular testing.   Per Dr. Kirke Corin, patient may hold Plavix 5 days before procedure.  Patient should take aspirin 81 mg daily throughout peri-procedural period. Stop aspirin when Plavix is resumed.   I will route this recommendation to the requesting party via Epic fax function and remove from pre-op pool. Please call with questions.  Carlos Levering, NP 07/08/2023, 9:37 AM

## 2023-07-08 NOTE — Telephone Encounter (Signed)
 Hold Plavix 5 days before.

## 2023-07-09 NOTE — Telephone Encounter (Signed)
 Notified patient to hold plavix 5 days prior to procedure.

## 2023-07-12 ENCOUNTER — Encounter: Payer: Self-pay | Admitting: Gastroenterology

## 2023-07-12 NOTE — Anesthesia Preprocedure Evaluation (Addendum)
 Anesthesia Evaluation  Patient identified by MRN, date of birth, ID band Patient awake    Reviewed: Allergy & Precautions, H&P , NPO status , Patient's Chart, lab work & pertinent test results  Airway Mallampati: III   Neck ROM: Full    Dental no notable dental hx. (+) Edentulous Upper, Poor Dentition Only has lower teeth:   Pulmonary neg pulmonary ROS, Patient abstained from smoking., former smoker   Pulmonary exam normal breath sounds clear to auscultation       Cardiovascular hypertension, + angina  + CAD, + Past MI and + Peripheral Vascular Disease  negative cardio ROS Normal cardiovascular exam Rhythm:Regular Rate:Normal  07-08-23 office note Dr. Aurora Blowers  he patient was hospitalized in 09/2021 with unstable angina.  Patient underwent cardiac cath by Dr. Starlin Echevaria which showed severe mid RCA stenosis.  PCI was planned but patient had a large groin hematoma.  She was noted to have significant bilateral iliac disease.  She underwent staged RCA PCI via the right radial artery.She has significant bilateral leg cardiac calcification due to iliac disease.  Enterography 10/2021 showed severely calcified but her aorta with no obstructive disease.  There is severe ostial left renal artery stenosis, no significant iliac disease on the right side and severe left external iliac artery stenosis extending into the distal left common iliac artery.  Successful DES placement to the left external iliac artery and drug-coated balloon angioplasty to the distal left common iliac artery without stent placement.    She was admitted for NSTEMI, N/V/esophagitis, HTN, HL, and AKI.  CTA cehst showed severe esophagitis. Echo showed normal LVEF, no WMA. Cardiac cath showed nonobstructive disease. She was discharged on Plavix, statin and BB.    Today, she is needing endoscopy for esophagitis. She has lost about 10lbs because she can't eat or drink. She is doing a little  better, but still has severe chest burning. Even drinking water hurts. Milk soothes the pain. Also has stabbing pain in the lower back. She quit drinking alcohol after the hospitalization. She also quit smoking.    Studies Reviewed: Aaron Aas  EKG Interpretation Date/Time:                  Thursday July 01 2023 15:01:10 EDT Ventricular Rate:         75 PR Interval:                 162 QRS Duration:             68 QT Interval:                 418 QTC Calculation:466 R Axis:                         17   Text Interpretation:Sinus rhythm with occasional Premature ventricular complexes minimal STE inferolateral leads, similar to prior TWI V1 and V2 When compared with ECG of 22-Jun-2023 11:57, Confirmed by Gennaro Khat, Cadence ([id number]) on 07/01/2023 4:42:52 PM     [facility] 06/2023   Prox RCA lesion is 40% stenosed.   Previously placed Mid RCA stent of unknown type is  widely patent.   1.  Widely patent mid RCA stent with no significant restenosis.  Stable moderate proximal RCA disease. 2.  Left ventricular angiography was not performed.  EF was normal by echo. 3.  Mildly elevated left ventricular end-diastolic pressure at 14 mmHg.   Recommendations: No culprit is identified for elevated troponin which is likely  due to supply demand mismatch in the setting of severe GI symptoms and electrolyte abnormalities.  Continue medical therapy.     Echo 06/2023 1. Left ventricular ejection fraction, by estimation, is 60 to 65%. The  left ventricle has normal function. The left ventricle has no regional  wall motion abnormalities. Left ventricular diastolic parameters are  indeterminate. The average left  ventricular global longitudinal strain is -21.2 %. The global longitudinal  strain is normal.   2. Right ventricular systolic function is normal. The right ventricular  size is normal. Tricuspid regurgitation signal is inadequate for assessing  PA pressure.   3. The mitral valve is normal in structure. Moderate mitral  valve  regurgitation. No evidence of mitral stenosis.   4. Tricuspid valve regurgitation is mild to moderate.   5. The aortic valve is tricuspid. Aortic valve regurgitation is not  visualized. No aortic stenosis is present.   6. The inferior vena cava is normal in size with greater than 50%  respiratory variability, suggesting right atrial pressure of 3 mmHg.     Neuro/Psych Seizures -,  PSYCHIATRIC DISORDERS  Depression     Neuromuscular disease CVA negative neurological ROS  negative psych ROS   GI/Hepatic negative GI ROS, Neg liver ROS, PUD,,,  Endo/Other  negative endocrine ROS    Renal/GU negative Renal ROS  negative genitourinary   Musculoskeletal negative musculoskeletal ROS (+)    Abdominal   Peds negative pediatric ROS (+)  Hematology negative hematology ROS (+)   Anesthesia Other Findings Tobacco abuse quit June 2023  Alcohol abuse--quit June 2023 History of tongue cancer  Coronary artery disease, stents to RCA PAD (peripheral artery disease) stents   Seizure (HCC) Paroxysmal sinus tachycardia (HCC) Palpitations Unspecified mood (affective) disorder SIRS (systemic inflammatory response syndrome) (HCC) Recurrent major depressive disorder  Suicidal ideation 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) Wears dentures  NSTEMI (non-ST elevated myocardial infarction) (HCC) History of heavy alcohol consumption--quit June 2023 Esophagitis H/O heart artery stent     Reproductive/Obstetrics negative OB ROS                              Anesthesia Physical Anesthesia Plan  ASA: 3  Anesthesia Plan: MAC   Post-op Pain Management:    Induction: Intravenous  PONV Risk Score and Plan:   Airway Management Planned: Natural Airway and Nasal Cannula  Additional Equipment:   Intra-op Plan:   Post-operative Plan:   Informed Consent: I  have reviewed the patients History and Physical, chart, labs and discussed the procedure including the risks, benefits and alternatives for the proposed anesthesia with the patient or authorized representative who has indicated his/her understanding and acceptance.     Dental Advisory Given  Plan Discussed with: Anesthesiologist, CRNA and Surgeon  Anesthesia Plan Comments: (Patient consented for risks of anesthesia including but not limited to:  - adverse reactions to medications - damage to eyes, teeth, lips or other oral mucosa - nerve damage due to positioning  - sore throat or hoarseness - Damage to heart, brain, nerves, lungs, other parts of body or loss of life  Patient voiced understanding and assent.)       Anesthesia Quick Evaluation

## 2023-07-20 ENCOUNTER — Ambulatory Visit: Payer: Self-pay | Admitting: Anesthesiology

## 2023-07-20 ENCOUNTER — Encounter
Admission: RE | Disposition: A | Discharge status: Home / Self Care | Payer: Self-pay | Source: Home / Self Care | Attending: Gastroenterology

## 2023-07-20 ENCOUNTER — Ambulatory Visit
Admission: RE | Admit: 2023-07-20 | Discharge: 2023-07-20 | Disposition: A | Discharge status: Home / Self Care | Attending: Gastroenterology | Admitting: Gastroenterology

## 2023-07-20 ENCOUNTER — Encounter: Payer: Self-pay | Admitting: Gastroenterology

## 2023-07-20 DIAGNOSIS — Z79899 Other long term (current) drug therapy: Secondary | ICD-10-CM | POA: Diagnosis not present

## 2023-07-20 DIAGNOSIS — I739 Peripheral vascular disease, unspecified: Secondary | ICD-10-CM | POA: Diagnosis not present

## 2023-07-20 DIAGNOSIS — I10 Essential (primary) hypertension: Secondary | ICD-10-CM | POA: Diagnosis not present

## 2023-07-20 DIAGNOSIS — K3189 Other diseases of stomach and duodenum: Secondary | ICD-10-CM | POA: Diagnosis not present

## 2023-07-20 DIAGNOSIS — K2210 Ulcer of esophagus without bleeding: Secondary | ICD-10-CM | POA: Diagnosis not present

## 2023-07-20 DIAGNOSIS — Z87891 Personal history of nicotine dependence: Secondary | ICD-10-CM | POA: Diagnosis not present

## 2023-07-20 DIAGNOSIS — Z09 Encounter for follow-up examination after completed treatment for conditions other than malignant neoplasm: Secondary | ICD-10-CM | POA: Insufficient documentation

## 2023-07-20 DIAGNOSIS — Z7902 Long term (current) use of antithrombotics/antiplatelets: Secondary | ICD-10-CM | POA: Insufficient documentation

## 2023-07-20 DIAGNOSIS — I252 Old myocardial infarction: Secondary | ICD-10-CM | POA: Diagnosis not present

## 2023-07-20 DIAGNOSIS — K2100 Gastro-esophageal reflux disease with esophagitis, without bleeding: Secondary | ICD-10-CM

## 2023-07-20 DIAGNOSIS — I25119 Atherosclerotic heart disease of native coronary artery with unspecified angina pectoris: Secondary | ICD-10-CM | POA: Diagnosis not present

## 2023-07-20 DIAGNOSIS — I2510 Atherosclerotic heart disease of native coronary artery without angina pectoris: Secondary | ICD-10-CM | POA: Diagnosis not present

## 2023-07-20 HISTORY — DX: Personal history of other specified conditions: Z87.898

## 2023-07-20 HISTORY — DX: Esophagitis, unspecified without bleeding: K20.90

## 2023-07-20 HISTORY — DX: Presence of coronary angioplasty implant and graft: Z95.5

## 2023-07-20 HISTORY — DX: Presence of dental prosthetic device (complete) (partial): Z97.2

## 2023-07-20 HISTORY — PX: ESOPHAGOGASTRODUODENOSCOPY: SHX5428

## 2023-07-20 HISTORY — DX: Non-ST elevation (NSTEMI) myocardial infarction: I21.4

## 2023-07-20 SURGERY — EGD (ESOPHAGOGASTRODUODENOSCOPY)
Anesthesia: Monitor Anesthesia Care

## 2023-07-20 MED ORDER — PANTOPRAZOLE SODIUM 40 MG PO TBEC: 40.0000 mg | DELAYED_RELEASE_TABLET | Freq: Two times a day (BID) | ORAL | 3 refills | Status: DC

## 2023-07-20 MED ORDER — SODIUM CHLORIDE 0.9 % IV SOLN: INTRAVENOUS | Status: DC

## 2023-07-20 MED ORDER — LIDOCAINE HCL (CARDIAC) PF 100 MG/5ML IV SOSY
PREFILLED_SYRINGE | INTRAVENOUS | Status: DC | PRN
  Administered 2023-07-20: 20 mg via INTRAVENOUS
  Administered 2023-07-20: 60 mg via INTRAVENOUS

## 2023-07-20 MED ORDER — GLYCOPYRROLATE 0.2 MG/ML IJ SOLN
INTRAMUSCULAR | Status: DC | PRN
  Administered 2023-07-20: .2 mg via INTRAVENOUS

## 2023-07-20 MED ORDER — PROPOFOL 10 MG/ML IV BOLUS
INTRAVENOUS | Status: AC
  Filled 2023-07-20: qty 20

## 2023-07-20 MED ORDER — PROPOFOL 10 MG/ML IV BOLUS: INTRAVENOUS | Status: DC | PRN

## 2023-07-20 MED ORDER — EPHEDRINE SULFATE (PRESSORS) 50 MG/ML IJ SOLN
INTRAMUSCULAR | Status: DC | PRN
Start: 2023-07-20 — End: 2023-07-20
  Administered 2023-07-20: 5 mg via INTRAVENOUS

## 2023-07-20 MED ORDER — LIDOCAINE HCL (PF) 2 % IJ SOLN
INTRAMUSCULAR | Status: AC
Start: 2023-07-20 — End: ?
  Filled 2023-07-20: qty 5

## 2023-07-20 MED ORDER — EPHEDRINE 5 MG/ML INJ
INTRAVENOUS | Status: AC
  Filled 2023-07-20: qty 5

## 2023-07-20 MED ORDER — PROPOFOL 10 MG/ML IV BOLUS
INTRAVENOUS | Status: DC | PRN
  Administered 2023-07-20: 30 mg via INTRAVENOUS
  Administered 2023-07-20: 100 mg via INTRAVENOUS
  Administered 2023-07-20: 30 mg via INTRAVENOUS

## 2023-07-20 MED ORDER — GLYCOPYRROLATE 0.2 MG/ML IJ SOLN
INTRAMUSCULAR | Status: AC
  Filled 2023-07-20: qty 1

## 2023-07-20 SURGICAL SUPPLY — 5 items
BLOCK BITE 60FR ADLT L/F GRN (MISCELLANEOUS) ×1 IMPLANT
GOWN CVR UNV OPN BCK APRN NK (MISCELLANEOUS) ×2 IMPLANT
KIT PRC NS LF DISP ENDO (KITS) ×1 IMPLANT
MANIFOLD NEPTUNE II (INSTRUMENTS) ×1 IMPLANT
WATER STERILE IRR 250ML POUR (IV SOLUTION) ×1 IMPLANT

## 2023-07-20 NOTE — Anesthesia Postprocedure Evaluation (Signed)
 Anesthesia Post Note  Patient: Sonya Harmon  Procedure(s) Performed: EGD (ESOPHAGOGASTRODUODENOSCOPY)  Patient location during evaluation: PACU Anesthesia Type: MAC Level of consciousness: awake and alert Pain management: pain level controlled Vital Signs Assessment: post-procedure vital signs reviewed and stable Respiratory status: spontaneous breathing, nonlabored ventilation, respiratory function stable and patient connected to nasal cannula oxygen Cardiovascular status: blood pressure returned to baseline and stable Postop Assessment: no apparent nausea or vomiting Anesthetic complications: no   No notable events documented.   Last Vitals:  Vitals:   07/20/23 1000 07/20/23 1005  BP: 117/69   Pulse: 62 62  Resp: 18 15  Temp: 36.6 C   SpO2: 98% 100%    Last Pain:  Vitals:   07/20/23 1000  PainSc: 0-No pain                 Juana Montini C Lon Klippel

## 2023-07-20 NOTE — Addendum Note (Signed)
 Addendum  created 07/20/23 1036 by Danelle Dunning [place], CRNA   Intraprocedure Meds edited

## 2023-07-20 NOTE — Transfer of Care (Signed)
 Immediate Anesthesia Transfer of Care Note  Patient: Sonya Harmon  Procedure(s) Performed: EGD (ESOPHAGOGASTRODUODENOSCOPY)  Patient Location: PACU  Anesthesia Type: MAC  Level of Consciousness: awake, alert  and patient cooperative  Airway and Oxygen Therapy: Patient Spontanous Breathing and Patient connected to supplemental oxygen  Post-op Assessment: Post-op Vital signs reviewed, Patient's Cardiovascular Status Stable, Respiratory Function Stable, Patent Airway and No signs of Nausea or vomiting  Post-op Vital Signs: Reviewed and stable  Complications: No notable events documented.

## 2023-07-20 NOTE — H&P (Signed)
 Arlyss Repress, MD [address]  [address]  [place], [place] [address]  Main: [phone]  Fax: [phone] Pager: [phone]  Primary Care Physician:  Ailene Ravel, MD Primary Gastroenterologist:  Dr. Arlyss Repress  Pre-Procedure History & Physical: HPI:  Sonya Harmon is a 53 y.o. female is here for an endoscopy.   Past Medical History:  Diagnosis Date   Alcohol abuse    Coronary artery disease 09/2021   s/p PCI to RCA in setting of unstable angina   Esophagitis    H/O heart artery stent    History of heavy alcohol consumption    History of tongue cancer    Myocardial infarction (HCC) 06/11/2023   NSTEMI   NSTEMI (non-ST elevated myocardial infarction) (HCC)    PAD (peripheral artery disease) (HCC)    Palpitations 07/15/2017   Paroxysmal sinus tachycardia (HCC) 07/16/2017   Polyneuropathy 12/02/2017   PRES (posterior reversible encephalopathy syndrome) 12/02/2017   Recurrent major depressive disorder (HCC) 01/01/2018   Seizure (HCC) 12/22/2017   SIRS (systemic inflammatory response syndrome) (HCC) 08/01/2017   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   Suicidal ideation 01/01/2018   Tobacco abuse    Unspecified mood (affective) disorder (HCC) 01/01/2018   Wears dentures    full upper    Past Surgical History:  Procedure Laterality Date   ABDOMINAL AORTOGRAM W/LOWER EXTREMITY N/A 10/15/2021   Procedure: ABDOMINAL AORTOGRAM W/LOWER EXTREMITY;  Surgeon: Iran Ouch, MD;  Location: [facility];  Service: Cardiovascular;  Laterality: N/A;   ABDOMINAL AORTOGRAM W/LOWER EXTREMITY N/A 12/30/2022   Procedure: ABDOMINAL AORTOGRAM W/LOWER EXTREMITY;  Surgeon: Iran Ouch, MD;  Location: [facility];  Service: Cardiovascular;  Laterality: N/A;   BILATERAL SALPINGECTOMY     BREAST BIOPSY Left 06/03/2023   [place] Bx, Heart Clip, Path pending   BREAST BIOPSY Left 06/03/2023   [place] LT BREAST BX W LOC DEV 1ST LESION IMG BX SPEC  US GUIDE 06/03/2023 [facility]   COLONOSCOPY WITH PROPOFOL N/A 03/11/2022   Procedure: COLONOSCOPY WITH PROPOFOL;  Surgeon: Toney Reil, MD;  Location: [facility] ENDOSCOPY;  Service: Gastroenterology;  Laterality: N/A;   COLONOSCOPY WITH PROPOFOL N/A 03/18/2023   Procedure: COLONOSCOPY WITH PROPOFOL;  Surgeon: Toney Reil, MD;  Location: [facility] ENDOSCOPY;  Service: Gastroenterology;  Laterality: N/A;   CORONARY BALLOON ANGIOPLASTY N/A 09/08/2021   Procedure: CORONARY BALLOON ANGIOPLASTY - ABORTED;  Surgeon: Iran Ouch, MD;  Location: [facility] INVASIVE CV LAB;  Service: Cardiovascular;  Laterality: N/A;   CORONARY STENT INTERVENTION N/A 09/10/2021   Procedure: CORONARY STENT INTERVENTION;  Surgeon: Iran Ouch, MD;  Location: [facility] INVAS[facility] CV LAB;  Service: Cardiovascular;  Laterality: N/A;   ESOPHAGOGASTRODUODENOSCOPY (EGD) WITH PROPOFOL N/A 11/01/2021   Procedure: ESOPHAGOGASTRODUODENOSCOPY (EGD) WITH PROPOFOL;  Surgeon: Toney Reil, MD;  Location: [facility] ENDOSCOPY;  Service: Gastroenterology;  Laterality: N/A;   ESOPHAGOGASTRODUODENOSCOPY (EGD) WITH PROPOFOL N/A 03/11/2022   Procedure: ESOPHAGOGASTRODUODENOSCOPY (EGD) WITH PROPOFOL;  Surgeon: Toney Reil, MD;  Location: [facility] ENDOSCOPY;  Service: Gastroenterology;  Laterality: N/A;   LEFT HEART CATH AND CORONARY ANGIOGRAPHY N/A 09/08/2021   Procedure: LEFT HEART CATH AND CORONARY ANGIOGRAPHY;  Surgeon: Laurier Nancy, MD;  Location: [facility] INVAS[facility] CV [facility];  Service: Cardiovascular;  Laterality: N/A;   LEFT HEART CATH AND CORONARY ANGIOGRAPHY N/A 06/14/2023   Procedure: LEFT HEART CATH AND CORONARY ANGIOGRAPHY;  Surgeon: Iran Ouch, MD;  Location: [facility] CV LAB;  Service: Cardiovascular;  Laterality: N/A;   PERIPHERAL VASCULAR INTERVENTION  10/15/2021   Procedure: PERIPHERAL VASCULAR INTERVENTION;  Surgeon: Iran Ouch, MD;  Location: [facility];  Service: Cardiovascular;;    POLYPECTOMY  03/18/2023   Procedure: POLYPECTOMY;  Surgeon: Toney Reil, MD;  Location: [facility] ENDOSCOPY;  Service: Gastroenterology;;   TONGUE SURGERY      Prior to Admission medications   Medication Sig Start Date End Date Taking? Authorizing Provider  atorvastatin (LIPITOR) 80 MG tablet TAKE 1 TABLET BY MOUTH EVERY DAY 12/17/22  Yes Hammock, Sheri, NP  buPROPion (WELLBUTRIN SR) 150 MG 12 hr tablet TAKE 1 TABLET BY MOUTH TWICE A DAY 02/04/23  Yes Hammock, Sheri, NP  clopidogrel (PLAVIX) 75 MG tablet Take 1 tablet (75 mg total) by mouth daily. 08/13/22 08/13/23 Yes Gollan, Tollie Pizza, MD  escitalopram (LEXAPRO) 10 MG tablet Take 10 mg by mouth daily. 05/25/23  Yes [provider]  folic acid (FOLVITE) 1 MG tablet Take 1 tablet (1 mg total) by mouth daily. 06/15/23  Yes Arnetha Courser, MD  gabapentin (NEURONTIN) 300 MG capsule Take 300 mg by mouth 3 (three) times daily.   Yes [provider]  metoprolol tartrate (LOPRESSOR) 25 MG tablet Take 1 tablet (25 mg total) by mouth 2 (two) times daily. 06/15/23  Yes Arnetha Courser, MD  Multiple Vitamin (MULTIVITAMIN WITH MINERALS) TABS tablet Take 1 tablet by mouth daily. 06/15/23  Yes Arnetha Courser, MD  pantoprazole (PROTONIX) 40 MG tablet Take 1 tablet (40 mg total) by mouth 2 (two) times daily. 06/25/23  Yes Antonieta Iba, MD  thiamine (VITAMIN B-1) 100 MG tablet Take 1 tablet (100 mg total) by mouth daily. 06/15/23  Yes Arnetha Courser, MD  varenicline (CHANTIX CONTINUING MONTH PAK) 1 MG tablet Take 1 tablet (1 mg total) by mouth 2 (two) times daily. 05/02/23  Yes Iran Ouch, MD  ondansetron (ZOFRAN-ODT) 4 MG disintegrating tablet Take 1 tablet (4 mg total) by mouth every 8 (eight) hours as needed for nausea or vomiting. Patient not taking: Reported on 07/12/2023 05/21/23   Claybon Jabs, MD  sucralfate (CARAFATE) 1 GM/10ML suspension Take 10 mLs (1 g total) by mouth 4 (four) times daily -  with meals and at bedtime. Patient not taking:  Reported on 07/12/2023 06/17/23   Celso Amy, PA-C    Allergies as of 06/17/2023 - Review Complete 06/17/2023  Allergen Reaction Noted   Aspirin Other (See Comments) 12/30/2022   Amoxicill-clarithro-lansopraz Nausea And Vomiting 11/22/2015   Amoxicillin Rash 09/07/2021    Family History  Problem Relation Age of Onset   Cancer Mother    Cancer Father    Stroke Sister    Aneurysm Sister    Breast cancer Neg Hx     Social History   Socioeconomic History   Marital status: Married    Spouse name: Not on file   Number of children: Not on file   Years of education: Not on file   Highest education level: Not on file  Occupational History   Not on file  Tobacco Use   Smoking status: Former    Current packs/day: 0.00    Average packs/day: 0.5 packs/day for 36.8 years (18.4 ttl pk-yrs)    Types: Cigarettes    Start date: 09/08/1986    Quit date: 06/11/2023    Years since quitting: 0.1   Smokeless tobacco: Former  Building services engineer status: Former  Substance and Sexual Activity   Alcohol use: Not Currently  Alcohol/week: 42.0 standard drinks of alcohol    Types: 42 Cans of beer per week    Comment: quit in Mar 2025   Drug use: Not Currently    Types: Marijuana   Sexual activity: Yes    Partners: Male  Other Topics Concern   Not on file  Social History Narrative   ** Merged History Encounter **       Social Drivers of Health   Financial Resource Strain: Not on file  Food Insecurity: No Food Insecurity (06/11/2023)   Hunger Vital Sign    Worried About Running Out of Food in the Last Year: Never true    Ran Out of Food in the Last Year: Never true  Transportation Needs: No Transportation Needs (06/11/2023)   PRAPARE - Administrator, Civil Service (Medical): No    Lack of Transportation (Non-Medical): No  Physical Activity: Not on file  Stress: Not on file  Social Connections: Unknown (08/17/2021)   [facility] from [facility]   Social  Network    Social Network: Not on file  Intimate Partner Violence: Not At Risk (06/11/2023)   Humiliation, Afraid, Rape, and Kick questionnaire    Fear of Current or Ex-Partner: No    Emotionally Abused: No    Physically Abused: No    Sexually Abused: No    Review of Systems: See HPI, otherwise negative ROS  Physical Exam: BP 106/76   Pulse (!) 53   Temp 98.8 F (37.1 C)   Ht 5\' 2"  (1.575 m)   Wt 44 kg   SpO2 100%   BMI 17.74 kg/m  General:   Alert,  pleasant and cooperative in NAD Head:  Normocephalic and atraumatic. Neck:  Supple; no masses or thyromegaly. Lungs:  Clear throughout to auscultation.    Heart:  Regular rate and rhythm. Abdomen:  Soft, nontender and nondistended. Normal bowel sounds, without guarding, and without rebound.   Neurologic:  Alert and  oriented x4;  grossly normal neurologically.  Impression/Plan: Maraya D Marzec is here for an endoscopy to be performed for follow up of esophagitis  Risks, benefits, limitations, and alternatives regarding  endoscopy have been reviewed with the patient.  Questions have been answered.  All parties agreeable.   Ellis Guys, MD  07/20/2023, 8:42 AM

## 2023-07-20 NOTE — Op Note (Signed)
 [facility] Gastroenterology Patient Name: Sonya Harmon Procedure Date: 07/20/2023 9:38 AM MRN: [id number] Account #: [id number] Date of Birth: [birth year] Admit Type: Outpatient Age: 53 Room: [facility] OR ROOM 01 Gender: Female Note Status: Finalized Instrument Name: [id number] Procedure:             Upper GI endoscopy Indications:           Follow-up of esophagitis Providers:             Toney Reil MD, MD Referring MD:          Durward Fortes. Hamrick (Referring MD) Medicines:             General Anesthesia Complications:         No immediate complications. Estimated blood loss: None. Procedure:             Pre-Anesthesia Assessment:                        - Prior to the procedure, a History and Physical was                         performed, and patient medications and allergies were                         reviewed. The patient is competent. The risks and                         benefits of the procedure and the sedation options and                         risks were discussed with the patient. All questions                         were answered and informed consent was obtained.                         Patient identification and proposed procedure were                         verified by the physician, the nurse, the                         anesthesiologist, the anesthetist and the technician                         in the pre-procedure area in the procedure room in the                         endoscopy suite. Mental Status Examination: alert and                         oriented. Airway Examination: normal oropharyngeal                         airway and neck mobility. Respiratory Examination:                         clear to auscultation. CV Examination: normal.  Prophylactic Antibiotics: The patient does not require                         prophylactic antibiotics. Prior Anticoagulants: The                         patient has taken no  anticoagulant or antiplatelet                         agents. ASA Grade Assessment: III - A patient with                         severe systemic disease. After reviewing the risks and                         benefits, the patient was deemed in satisfactory                         condition to undergo the procedure. The anesthesia                         plan was to use general anesthesia. Immediately prior                         to administration of medications, the patient was                         re-assessed for adequacy to receive sedatives. The                         heart rate, respiratory rate, oxygen saturations,                         blood pressure, adequacy of pulmonary ventilation, and                         response to care were monitored throughout the                         procedure. The physical status of the patient was                         re-assessed after the procedure.                        After obtaining informed consent, the endoscope was                         passed under direct vision. Throughout the procedure,                         the patient's blood pressure, pulse, and oxygen                         saturations were monitored continuously. The Endoscope                         was introduced through the mouth, and advanced to the  third part of duodenum. The upper GI endoscopy was                         accomplished without difficulty. The patient tolerated                         the procedure well. Findings:      Diffuse mildly erythematous mucosa without active bleeding and with no       stigmata of bleeding was found in the duodenal bulb and in the second       portion of the duodenum.      The entire examined stomach was normal.      The cardia and gastric fundus were normal on retroflexion.      Esophagogastric landmarks were identified: the gastroesophageal junction       was found at 40 cm from the incisors.       The gastroesophageal junction was normal.      One superficial esophageal ulcer with no bleeding and no stigmata of       recent bleeding was found 35 cm from the incisors. The lesion was 20 mm       in largest dimension. Impression:            - Erythematous duodenopathy.                        - Normal stomach.                        - Esophagogastric landmarks identified.                        - Normal gastroesophageal junction.                        - Esophageal ulcer with no bleeding and no stigmata of                         recent bleeding.                        - No specimens collected. Recommendation:        - Discharge patient to home (with escort).                        - Resume previous diet today.                        - Continue present medications.                        - Continue PPI BID long term                        - Repeat upper endoscopy in 3 months to check healing. Procedure Code(s):     --- Professional ---                        [phone], Esophagogastroduodenoscopy, flexible,                         transoral; diagnostic, including collection of  specimen(s) by brushing or washing, when performed                         (separate procedure) Diagnosis Code(s):     --- Professional ---                        K31.89, Other diseases of stomach and duodenum                        K22.10, Ulcer of esophagus without bleeding                        K20.90, Esophagitis, unspecified without bleeding CPT copyright 2022 American Medical Association. All rights reserved. The codes documented in this report are preliminary and upon coder review may  be revised to meet current compliance requirements. Dr. Evia Hof Selena Daily MD, MD 07/20/2023 9:53:31 AM This report has been signed electronically. Number of Addenda: 0 Note Initiated On: 07/20/2023 9:38 AM Total Procedure Duration: 0 hours 2 minutes 39 seconds  Estimated Blood Loss:   Estimated blood loss: none.      [facility]

## 2023-07-21 ENCOUNTER — Telehealth: Payer: Self-pay | Admitting: Gastroenterology

## 2023-07-21 NOTE — Telephone Encounter (Signed)
 She does not need to see me for follow-up.  She needs EGD in 3 months to confirm healing of esophageal ulcer.  She has to continue Protonix 40 mg twice daily before meals long-term  RV

## 2023-07-21 NOTE — Telephone Encounter (Signed)
 Put a reminder for 3 months for repeat EGD. Called patient and left a message for call back

## 2023-07-21 NOTE — Telephone Encounter (Signed)
 Pt requesting call back to see if her appointment for 07/27/2023 is still needed

## 2023-07-21 NOTE — Telephone Encounter (Signed)
 Please advised you did a EGD on patient on 07/20/2023

## 2023-07-22 NOTE — Telephone Encounter (Signed)
 Informed patient and patient verbalized understanding of instructions. Cancelled appointment for 07/27/2023

## 2023-07-27 ENCOUNTER — Ambulatory Visit: Admitting: Gastroenterology

## 2023-08-27 DIAGNOSIS — R059 Cough, unspecified: Secondary | ICD-10-CM | POA: Diagnosis not present

## 2023-08-27 DIAGNOSIS — J439 Emphysema, unspecified: Secondary | ICD-10-CM | POA: Diagnosis not present

## 2023-09-20 ENCOUNTER — Encounter: Payer: Self-pay | Admitting: *Deleted

## 2023-09-30 ENCOUNTER — Encounter: Payer: Self-pay | Admitting: Cardiovascular Disease

## 2023-09-30 ENCOUNTER — Ambulatory Visit: Payer: 59 | Attending: Cardiovascular Disease | Admitting: Cardiovascular Disease

## 2023-09-30 VITALS — BP 150/48 | HR 70 | Ht 62.0 in | Wt 97.8 lb

## 2023-09-30 DIAGNOSIS — I739 Peripheral vascular disease, unspecified: Secondary | ICD-10-CM | POA: Diagnosis not present

## 2023-09-30 DIAGNOSIS — E785 Hyperlipidemia, unspecified: Secondary | ICD-10-CM

## 2023-09-30 DIAGNOSIS — I872 Venous insufficiency (chronic) (peripheral): Secondary | ICD-10-CM

## 2023-09-30 DIAGNOSIS — K2100 Gastro-esophageal reflux disease with esophagitis, without bleeding: Secondary | ICD-10-CM

## 2023-09-30 DIAGNOSIS — I2510 Atherosclerotic heart disease of native coronary artery without angina pectoris: Secondary | ICD-10-CM | POA: Diagnosis not present

## 2023-09-30 DIAGNOSIS — R0989 Other specified symptoms and signs involving the circulatory and respiratory systems: Secondary | ICD-10-CM | POA: Diagnosis not present

## 2023-09-30 DIAGNOSIS — I10 Essential (primary) hypertension: Secondary | ICD-10-CM | POA: Diagnosis not present

## 2023-09-30 DIAGNOSIS — K2210 Ulcer of esophagus without bleeding: Secondary | ICD-10-CM | POA: Diagnosis not present

## 2023-09-30 MED ORDER — PANTOPRAZOLE SODIUM 40 MG PO TBEC: 40.0000 mg | DELAYED_RELEASE_TABLET | Freq: Two times a day (BID) | ORAL | 3 refills | Status: AC

## 2023-09-30 MED ORDER — EZETIMIBE 10 MG PO TABS: 10.0000 mg | ORAL_TABLET | Freq: Every day | ORAL | 3 refills | Status: AC

## 2023-09-30 NOTE — Progress Notes (Signed)
 Cardiology Office Note   Date:  09/30/2023   ID:  [name], DOB 1970-06-18, MRN [id number]  PCP:  Sonya Charlene CROME, MD  Cardiologist:   Sonya Cage, MD   Chief Complaint  Patient presents with   Follow-up    6 month f/u. Meds reviewed verbally with pt. Pt has no complaint today feeling well.      History of Present Illness: Sonya Harmon is a 53 y.o. female who presents for a follow-up visit regarding coronary artery disease and peripheral arterial disease. She has history of tongue cancer, tobacco use, alcohol abuse and hyperlipidemia. She was hospitalized in June, 2023 with unstable angina.  She underwent cardiac catheterization by Dr. Fernand from the right femoral artery which showed severe mid RCA stenosis.  PCI was planned but patient had a large groin hematoma.  She was noted to have significant bilateral iliac disease.  She underwent staged RCA PCI via the right radial artery.    The patient had significant bilateral leg claudication due to iliac disease. Angiography was performed in July 2023 which showed severely calcified aorta with no obstructive disease.  There was severe ostial left renal artery stenosis, no significant iliac disease on the right side and severe left external iliac artery stenosis extending into the distal left common iliac artery.  I performed successful drug-eluting stent placement to the left external iliac artery and drug-coated balloon angioplasty to the distal left common iliac artery.  A stent was not placed there to avoid jailing the internal iliac artery which was severely diseased at the ostium.  Postprocedure duplex showed normal iliac velocities.  She was hospitalized in July 2023 with syncope due to blood loss anemia.   She was seen in July, 2024 for lower extremity edema as well as burning sensation in both thighs and legs especially on the right side.  ABI was mildly reduced on the right side with borderline disease in the right  common iliac artery.  Angiography in August 2024 showed patent left iliac stent with no obstructive disease.  On the right side, there was moderate common iliac artery stenosis not significant by gradient with minimal SFA and popliteal disease.  She was hospitalized in March with chest pain and mildly elevated troponin.  Cardiac catheterization showed widely patent mid RCA stent with no significant restenosis with stable moderate proximal RCA disease.  No obstructive disease overall.  EF was normal by echo.  LVEDP was 14.  Elevated troponin was felt to be due to supply demand ischemia.  She has severe esophagitis followed by gastroenterology.  She has been doing reasonably well with no chest pain or worsening dyspnea.  She reports stable bilateral leg claudication.  She was prescribed Chantix  but could not quit smoking and she had some side effects.  Past Medical History:  Diagnosis Date   Alcohol abuse    Coronary artery disease 09/2021   s/p PCI to RCA in setting of unstable angina   Esophagitis    H/O heart artery stent    History of heavy alcohol consumption    History of tongue cancer    Myocardial infarction [facility]) 06/11/2023   NSTEMI   NSTEMI (non-ST elevated myocardial infarction) [facility])    PAD (peripheral artery disease) (HCC)    Palpitations 07/15/2017   Paroxysmal sinus tachycardia (HCC) 07/16/2017   Polyneuropathy 12/02/2017   PRES (posterior reversible encephalopathy syndrome) 12/02/2017   Recurrent major depressive disorder (HCC) 01/01/2018   Seizure (HCC) 12/22/2017   SIRS (  systemic inflammatory response syndrome) (HCC) 08/01/2017   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   Suicidal ideation 01/01/2018   Tobacco abuse    Unspecified mood (affective) disorder (HCC) 01/01/2018   Wears dentures    full upper    Past Surgical History:  Procedure Laterality Date   ABDOMINAL AORTOGRAM W/LOWER EXTREMITY N/A 10/15/2021   Procedure: ABDOMINAL AORTOGRAM  W/LOWER EXTREMITY;  Surgeon: Sonya Sonya LABOR, MD;  [facility]: [facility];  Service: Cardiovascular;  Laterality: N/A;   ABDOMINAL AORTOGRAM W/LOWER EXTREMITY N/A 12/30/2022   Procedure: ABDOMINAL AORTOGRAM W/LOWER EXTREMITY;  Surgeon: Sonya Sonya LABOR, MD;  Location: [facility];  Service: Cardiovascular;  Laterality: N/A;   BILATERAL SALPINGECTOMY     BREAST BIOPSY Left 06/03/2023   US  Bx, Heart Clip, Path pending   BREAST BIOPSY Left 06/03/2023   US  LT BREAST BX W LOC DEV 1ST LESION IMG BX SPEC US  GUIDE 06/03/2023 [facility]   COLONOSCOPY WITH PROPOFOL  N/A 03/11/2022   Procedure: COLONOSCOPY WITH PROPOFOL ;  Surgeon: Sonya Sonya Skiff, MD;  Location: [facility] ENDOS[facility];  Service: Gastroenterology;  Laterality: N/A;   COLONOSCOPY WITH PROPOFOL  N/A 03/18/2023   Procedure: COLONOSCOPY WITH PROPOFOL ;  Surgeon: Sonya Sonya Skiff, MD;  Location: [facility];  Service: Gastroenterology;  Laterality: N/A;   CORONARY BALLOON ANGIOPLASTY N/A 09/08/2021   Procedure: CORONARY BALLOON ANGIOPLASTY - ABORTED;  Surgeon: Sonya Sonya LABOR, MD;  Location: [facility];  Service: Cardiovascular;  Laterality: N/A;   CORONARY STENT INTERVENTION N/A 09/10/2021   Procedure: CORONARY STENT INTERVENTION;  Surgeon: Sonya Sonya LABOR, MD;  Location: [facility];  Service: Cardiovascular;  Laterality: N/A;   ESOPHAGOGASTRODUODENOSCOPY N/A 07/20/2023   Procedure: EGD (ESOPHAGOGASTRODUODENOSCOPY);  Surgeon: Sonya Sonya Skiff, MD;  Location: [facility];  Service: Endoscopy;  Laterality: N/A;   ESOPHAGOGASTRODUODENOSCOPY (EGD) WITH PROPOFOL  N/A 11/01/2021   Procedure: ESOPHAGOGASTRODUODENOSCOPY (EGD) WITH PROPOFOL ;  Surgeon: Sonya Sonya Skiff, MD;  Location: [facility];  Service: Gastroenterology;  Laterality: N/A;   ESOPHAGOGASTRODUODENOSCOPY (EGD) WITH PROPOFOL  N/A 03/11/2022   Procedure: ESOPHAGOGASTRODUODENOSCOPY (EGD) WITH PROPOFOL ;  Surgeon: Sonya Sonya Skiff,  MD;  Location: [facility];  Service: Gastroenterology;  Laterality: N/A;   LEFT HEART CATH AND CORONARY ANGIOGRAPHY N/A 09/08/2021   Procedure: LEFT HEART CATH AND CORONARY ANGIOGRAPHY;  Surgeon: Sonya Harmon Denyse LABOR, MD;  Location: [facility];  Service: Cardiovascular;  Laterality: N/A;   LEFT HEART CATH AND CORONARY ANGIOGRAPHY N/A 06/14/2023   Procedure: LEFT HEART CATH AND CORONARY ANGIOGRAPHY;  Surgeon: Sonya Sonya LABOR, MD;  Location: [facility];  Service: Cardiovascular;  Laterality: N/A;   PERIPHERAL VASCULAR INTERVENTION  10/15/2021   Procedure: PERIPHERAL VASCULAR INTERVENTION;  Surgeon: Sonya Sonya LABOR, MD;  [facility];  Service: Cardiovascular;;   POLYPECTOMY  03/18/2023   Procedure: POLYPECTOMY;  Surgeon: Sonya Sonya Skiff, MD;  Location: [facility];  Service: Gastroenterology;;   TONGUE SURGERY       Current Outpatient Medications  Medication Sig Dispense Refill   atorvastatin  (LIPITOR ) 80 MG tablet TAKE 1 TABLET BY MOUTH EVERY DAY 90 tablet 0   buPROPion  (WELLBUTRIN  SR) 150 MG 12 hr tablet TAKE 1 TABLET BY MOUTH TWICE A DAY 180 tablet 1   escitalopram  (LEXAPRO ) 10 MG tablet Take 10 mg by mouth daily.     folic acid  (FOLVITE ) 1 MG tablet Take 1 tablet (1 mg total) by mouth daily. 90 tablet 1   gabapentin  (NEURONTIN ) 300 MG capsule Take  300 mg by mouth 3 (three) times daily.     metoprolol  tartrate (LOPRESSOR ) 25 MG tablet Take 1 tablet (25 mg total) by mouth 2 (two) times daily. 60 tablet 2   Multiple Vitamin (MULTIVITAMIN WITH MINERALS) TABS tablet Take 1 tablet by mouth daily. 90 tablet 1   pantoprazole  (PROTONIX ) 40 MG tablet Take 1 tablet (40 mg total) by mouth 2 (two) times daily before a meal. 180 tablet 3   thiamine  (VITAMIN B-1) 100 MG tablet Take 1 tablet (100 mg total) by mouth daily. 90 tablet 1   varenicline  (CHANTIX  CONTINUING MONTH PAK) 1 MG tablet Take 1 tablet (1 mg total) by mouth 2 (two) times daily. 60 tablet 1    No current facility-administered medications for this visit.    Allergies:   Aspirin , Amoxicill-clarithro-lansopraz, and Amoxicillin    Social History:  The patient  reports that she quit smoking about 3 months ago. Her smoking use included cigarettes. She started smoking about 37 years ago. She has a 18.4 pack-year smoking history. She has quit using smokeless tobacco. She reports that she does not currently use alcohol after a past usage of about 42.0 standard drinks of alcohol per week. She reports that she does not currently use drugs after having used the following drugs: Marijuana.   Family History:  The patient's family history includes Aneurysm in her sister; Cancer in her father and mother; Stroke in her sister.    ROS:  Please see the history of present illness.   Otherwise, review of systems are positive for none.   All other systems are reviewed and negative.    PHYSICAL EXAM: VS:  BP (!) 150/48 (BP Location: Right Arm, Patient Position: Sitting, Cuff Size: Normal)   Pulse (!) 140   Ht 5' 2 (1.575 m)   Wt 97 lb 12.8 oz (44.4 kg)   SpO2 99%   BMI 17.89 kg/m  , BMI Body mass index is 17.89 kg/m. GEN: Well-developed in no acute distress. HEENT: normal  Neck: no JVD or masses.  Left carotid bruit. Cardiac: RRR; no murmurs, rubs, or gallops,no edema  Respiratory:  clear to auscultation bilaterally, normal work of breathing GI: soft, nontender, nondistended, + BS MS: no deformity or atrophy  Skin: warm and dry, no rash Neuro:  Strength and sensation are intact Psych: Alert and oriented with normal affect. Vascular: Femoral pulse: +1 bilaterally.    EKG:  EKG is ordered today.  Sinus rhythm Possible Left atrial enlargement ST elevation consider inferior injury or acute infarct When compared with ECG of 01-Jul-2023 15:01, T wave amplitude has decreased in Lateral leads ST changes are not new   Recent Labs: 06/15/2023: Magnesium  2.4 06/22/2023: ALT 17; BUN 9;  Creatinine, Ser 0.70; Potassium 3.5; Sodium 135 07/01/2023: Hemoglobin 12.8; Platelets 536    Lipid Panel    Component Value Date/Time   CHOL 202 (H) 04/01/2023 1059   TRIG 54 04/01/2023 1059   HDL 101 04/01/2023 1059   CHOLHDL 2.0 04/01/2023 1059   CHOLHDL 1.9 09/09/2021 0541   VLDL 6 09/09/2021 0541   LDLCALC 91 04/01/2023 1059      Wt Readings from Last 3 Encounters:  09/30/23 97 lb 12.8 oz (44.4 kg)  07/20/23 97 lb (44 kg)  07/01/23 96 lb 12.8 oz (43.9 kg)           No data to display            ASSESSMENT AND PLAN:  1.   Peripheral arterial  disease: Status post  left iliac stent.  Angiography last year showed patent left iliac stent and moderate right iliac disease.  There was no evidence of obstructive disease to explain her symptoms of burning and numbness in her legs.  I refilled clopidogrel .  2. Chronic venous insufficiency: Continue leg elevation and knee-high support stockings.    3.  Coronary artery disease involving native coronary arteries without angina: She had RCA stent in 2023.  Cardiac catheterization in March of this year showed patent RCA stents with stable moderate proximal disease.  4.  Tobacco use: I again discussed the importance of smoking cessation .  She could not quit with Chantix   5.  Hyperlipidemia: Continue treatment with atorvastatin  80 mg daily.  Most recent lipid profile showed an LDL of 91.  I elected to add ezetimibe  10 mg daily.  Recheck labs in 2 months.  6.  Left carotid bruit: Carotid Doppler in January 2024 showed mild nonobstructive disease.    Disposition: Follow-up in 6 months.  Signed,  Sonya Cage, MD  09/30/2023 9:56 AM    [facility]

## 2023-09-30 NOTE — Progress Notes (Signed)
 Order(s) created erroneously. Erroneous order ID: [id number]  Order moved by: JADINE ORIE CROME  Order move date/time: 09/30/2023 12:11 PM  Source Patient: [id number]  Source Contact: 09/30/2023  Destination Patient: [id number]  Destination Contact: 09/16/2022

## 2023-09-30 NOTE — Patient Instructions (Signed)
 Medication Instructions:  START Ezetimbie (Zetia ) 10 mg once daily  *If you need a refill on your cardiac medications before your next appointment, please call your pharmacy*  Lab Work: Your provider would like for you to return in 2 months to have the following labs drawn: Fasting Lipid and Liver.   Please go to [facility] [address] ([facility] [address], [place] [address] You do not need an appointment.  They are open from 8 am- 4:30 pm.  Lunch from 1:00 pm- 2:00 pm You will need to be fasting.   You may also go to one of the following [facility]:  [address] [place], [place] [address] Phone: [phone] Lab hours: Mon-Fri 8 am- 5 pm    Lunch 12 pm- 1 pm  [address] [place],  [place]  [address]  [place]  Phone: [phone] Lab hours: 7 am- 4 pm Lunch 12 pm-1 pm   [address] [place],  [place]  [address]  [place]  Phone: [phone] Lab hours: Mon-Fri 8 am- 5 pm    Lunch 12 pm- 1 pm  If you have labs (blood work) drawn today and your tests are completely normal, you will receive your results only by: MyChart Message (if you have MyChart) OR A paper copy in the mail If you have any lab test that is abnormal or we need to change your treatment, we will call you to review the results.  Testing/Procedures: None ordered  Follow-Up: At [facility], you and your health needs are our priority.  As part of our continuing mission to provide you with exceptional heart care, our providers are all part of one team.  This team includes your primary Cardiologist (physician) and Advanced Practice Providers or APPs (Physician Assistants and Nurse Practitioners) who all work together to provide you with the care you need, when you need it.  Your next appointment:   6 month(s)  Provider:   Dr. Darron  We recommend signing up for the patient portal called MyChart.  Sign up information is provided on this After Visit Summary.  MyChart is used to connect  with patients for Virtual Visits (Telemedicine).  Patients are able to view lab/test results, encounter notes, upcoming appointments, etc.  Non-urgent messages can be sent to your provider as well.   To learn more about what you can do with MyChart, go to [email or web address].

## 2023-12-13 DIAGNOSIS — K219 Gastro-esophageal reflux disease without esophagitis: Secondary | ICD-10-CM | POA: Diagnosis not present

## 2023-12-14 ENCOUNTER — Telehealth: Payer: Self-pay

## 2023-12-14 NOTE — Telephone Encounter (Signed)
 Dr. Darron,  Sonya Harmon is requesting preoperative cardiac evaluation for EGD.  Procedure is planned for 01/20/2024.  She was last seen in clinic by you on 09/30/2023.  During that time she was stable from a cardiac standpoint.  She denied chest pain and worsening shortness of breath.  She noted stable bilateral leg claudication.  Her PMH includes coronary artery disease status post PCI to RCA in the setting of unstable angina 6/23.  Repeat catheterization 06/14/2023 showed widely patent RCA stent.  Her PMH also includes tobacco abuse, alcohol abuse, esophagitis, hyperlipidemia, peripheral arterial disease, suicidal ideation, and polyneuropathy.  May her Plavix  be held prior to her procedure?  Thank you for your help.  Please direct your response to CVD IV preop pool.  Sonya Harmon. Sonya Mentink NP-C     12/14/2023, 1:04 PM [facility] [address] [place], [place] [address] Office [phone]  '

## 2023-12-14 NOTE — Telephone Encounter (Signed)
   Pre-operative Risk Assessment    Patient Name: Sonya Harmon  DOB: Aug 17, 1970 MRN: [id number]   Date of last office visit: 09/30/23 [name], MD Date of next office visit: NONE   Request for Surgical Clearance    Procedure:  EGD  Date of Surgery:  Clearance 01/20/24                                Surgeon:  NOT INDICATED Surgeon's Group or Practice Name:  [facility] Phone number:  [phone] Fax number:  [phone]   Type of Clearance Requested:   - Medical  - Pharmacy:  Hold Clopidogrel  (Plavix ) 01/15/24   Type of Anesthesia:  Not Indicated   Additional requests/questions:    Signed, [name]   12/14/2023, 12:18 PM

## 2023-12-16 NOTE — Telephone Encounter (Signed)
Hold Plavix 5 days before and resume after.

## 2023-12-16 NOTE — Telephone Encounter (Signed)
 Left message to call back to schedule tele pre op appt.

## 2023-12-16 NOTE — Telephone Encounter (Signed)
   Name: Sonya Harmon  DOB: [birth year]  MRN: [id number]  Primary Cardiologist: Timothy Gollan, MD   Preoperative team, please contact this patient and set up a phone call appointment for further preoperative risk assessment. Please obtain consent and complete medication review. Thank you for your help.  I confirm that guidance regarding antiplatelet and oral anticoagulation therapy has been completed and, if necessary, noted below.  Per Dr. Darron: Hold Plavix  5 days before and resume after.   I also confirmed the patient resides in the state of [place] . As per [facility] Medical Board telemedicine laws, the patient must reside in the state in which the provider is licensed.   Jon Nat Hails, PA 12/16/2023, 2:20 PM [facility]

## 2023-12-17 ENCOUNTER — Telehealth: Payer: Self-pay

## 2023-12-17 NOTE — Telephone Encounter (Signed)
 S/W pt and scheduled TELE preop appt 01/03/24. Med Rec and consent done   Will update the surgeons office

## 2023-12-17 NOTE — Telephone Encounter (Signed)
 Pt returning call

## 2023-12-17 NOTE — Telephone Encounter (Signed)
 Med Rec and consent done     Patient Consent for Virtual Visit        Sonya Harmon has provided verbal consent on 12/17/2023 for a virtual visit (video or telephone).   CONSENT FOR VIRTUAL VISIT FOR:  Sonya Harmon  By participating in this virtual visit I agree to the following:  I hereby voluntarily request, consent and authorize [facility] and its employed or contracted physicians, physician assistants, nurse practitioners or other licensed health care professionals (the Practitioner), to provide me with telemedicine health care services (the "Services) as deemed necessary by the treating Practitioner. I acknowledge and consent to receive the Services by the Practitioner via telemedicine. I understand that the telemedicine visit will involve communicating with the Practitioner through live audiovisual communication technology and the disclosure of certain medical information by electronic transmission. I acknowledge that I have been given the opportunity to request an in-person assessment or other available alternative prior to the telemedicine visit and am voluntarily participating in the telemedicine visit.  I understand that I have the right to withhold or withdraw my consent to the use of telemedicine in the course of my care at any time, without affecting my right to future care or treatment, and that the Practitioner or I may terminate the telemedicine visit at any time. I understand that I have the right to inspect all information obtained and/or recorded in the course of the telemedicine visit and may receive copies of available information for a reasonable fee.  I understand that some of the potential risks of receiving the Services via telemedicine include:  Delay or interruption in medical evaluation due to technological equipment failure or disruption; Information transmitted may not be sufficient (e.g. poor resolution of images) to allow for appropriate medical  decision making by the Practitioner; and/or  In rare instances, security protocols could fail, causing a breach of personal health information.  Furthermore, I acknowledge that it is my responsibility to provide information about my medical history, conditions and care that is complete and accurate to the best of my ability. I acknowledge that Practitioner's advice, recommendations, and/or decision may be based on factors not within their control, such as incomplete or inaccurate data provided by me or distortions of diagnostic images or specimens that may result from electronic transmissions. I understand that the practice of medicine is not an exact science and that Practitioner makes no warranties or guarantees regarding treatment outcomes. I acknowledge that a copy of this consent can be made available to me via my patient portal [facility] MyChart), or I can request a printed copy by calling the office of [facility].    I understand that my insurance will be billed for this visit.   I have read or had this consent read to me. I understand the contents of this consent, which adequately explains the benefits and risks of the Services being provided via telemedicine.  I have been provided ample opportunity to ask questions regarding this consent and the Services and have had my questions answered to my satisfaction. I give my informed consent for the services to be provided through the use of telemedicine in my medical care

## 2023-12-20 DIAGNOSIS — Z23 Encounter for immunization: Secondary | ICD-10-CM | POA: Diagnosis not present

## 2023-12-20 DIAGNOSIS — G621 Alcoholic polyneuropathy: Secondary | ICD-10-CM | POA: Diagnosis not present

## 2023-12-20 DIAGNOSIS — Z87898 Personal history of other specified conditions: Secondary | ICD-10-CM | POA: Diagnosis not present

## 2023-12-20 DIAGNOSIS — L299 Pruritus, unspecified: Secondary | ICD-10-CM | POA: Diagnosis not present

## 2023-12-20 DIAGNOSIS — J439 Emphysema, unspecified: Secondary | ICD-10-CM | POA: Diagnosis not present

## 2023-12-20 DIAGNOSIS — I2510 Atherosclerotic heart disease of native coronary artery without angina pectoris: Secondary | ICD-10-CM | POA: Diagnosis not present

## 2023-12-20 DIAGNOSIS — F411 Generalized anxiety disorder: Secondary | ICD-10-CM | POA: Diagnosis not present

## 2024-01-03 ENCOUNTER — Ambulatory Visit: Attending: Cardiology | Admitting: Nurse Practitioner

## 2024-01-03 ENCOUNTER — Encounter: Payer: Self-pay | Admitting: Nurse Practitioner

## 2024-01-03 DIAGNOSIS — Z01810 Encounter for preprocedural cardiovascular examination: Secondary | ICD-10-CM | POA: Diagnosis not present

## 2024-01-03 NOTE — Progress Notes (Signed)
 Virtual Visit via Telephone Note   Because of Sonya Harmon co-morbid illnesses, she is at least at moderate risk for complications without adequate follow up.  This format is felt to be most appropriate for this patient at this time.  Due to technical limitations with video connection (technology), today's appointment will be conducted as an audio only telehealth visit, and Sonya Harmon verbally agreed to proceed in this manner.   All issues noted in this document were discussed and addressed.  No physical exam could be performed with this format.  Evaluation Performed:  Preoperative cardiovascular risk assessment _____________   Date:  01/03/2024   Patient ID:  Sonya Harmon, DOB 06/26/1970, MRN [id number] Patient Location:  Home Provider location:   Office  Primary Care Provider:  [name], MD Primary Cardiologist:  Evalene Lunger, MD  Chief Complaint / Patient Profile   53 y.o. y/o female with a h/o tobacco abuse, PAD, hyperlipidemia, CAD with staged PCI to RCA 09/2021, repeat cath 06/14/23 with prox RCA lesion 40%, mid RCA widely patent, Etoh abuse, chronic venous insufficiency, mild carotid artery disease who is pending EGD on 01/20/24 and presents today for telephonic preoperative cardiovascular risk assessment.  History of Present Illness    Sonya Harmon is a 53 y.o. female who presents via audio/video conferencing for a telehealth visit today.  Pt was last seen in cardiology clinic on 09/30/23 by Dr. Darron.  At that time Sonya Harmon was doing well The patient is now pending procedure as outlined above. Since her last visit, she  denies chest pain, shortness of breath, lower extremity edema, fatigue, palpitations, melena, hematuria, hemoptysis, diaphoresis, weakness, presyncope, syncope, orthopnea, and PND. She is active and is able to complete > 4 METS Activity without concerning cardiac symptoms.   Past Medical History    Past Medical History:  Diagnosis Date    Alcohol abuse    Coronary artery disease 09/2021   s/p PCI to RCA in setting of unstable angina   Esophagitis    H/O heart artery stent    History of heavy alcohol consumption    History of tongue cancer    Myocardial infarction (HCC) 06/11/2023   NSTEMI   NSTEMI (non-ST elevated myocardial infarction) (HCC)    PAD (peripheral artery disease)    Palpitations 07/15/2017   Paroxysmal sinus tachycardia 07/16/2017   Polyneuropathy 12/02/2017   PRES (posterior reversible encephalopathy syndrome) 12/02/2017   Recurrent major depressive disorder 01/01/2018   Seizure (HCC) 12/22/2017   SIRS (systemic inflammatory response syndrome) (HCC) 08/01/2017   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   Suicidal ideation 01/01/2018   Tobacco abuse    Unspecified mood (affective) disorder 01/01/2018   Wears dentures    full upper   Past Surgical History:  Procedure Laterality Date   ABDOMINAL AORTOGRAM W/LOWER EXTREMITY N/A 10/15/2021   Procedure: ABDOMINAL AORTOGRAM W/LOWER EXTREMITY;  Surgeon: Darron Deatrice LABOR, MD;  [facility];  Service: Cardiovascular;  Laterality: N/A;   ABDOMINAL AORTOGRAM W/LOWER EXTREMITY N/A 12/30/2022   Procedure: ABDOMINAL AORTOGRAM W/LOWER EXTREMITY;  Surgeon: Darron Deatrice LABOR, MD;  Location: [facility];  Service: Cardiovascular;  Laterality: N/A;   BILATERAL SALPINGECTOMY     BREAST BIOPSY Left 06/03/2023   US  Bx, Heart Clip, Path pending   BREAST BIOPSY Left 06/03/2023   US  LT BREAST BX W LOC DEV 1ST LESION IMG BX SPEC US  GUIDE 06/03/2023 [facility]   COLONOSCOPY WITH  PROPOFOL  N/A 03/11/2022   Procedure: COLONOSCOPY WITH PROPOFOL ;  Surgeon: Unk Corinn Skiff, MD;  Location: [facility];  Service: Gastroenterology;  Laterality: N/A;   COLONOSCOPY WITH PROPOFOL  N/A 03/18/2023   Procedure: COLONOSCOPY WITH PROPOFOL ;  Surgeon: Unk Corinn Skiff, MD;  Location: [facility] ENDOSCOPY;  Service: Gastroenterology;  Laterality:  N/A;   CORONARY BALLOON ANGIOPLASTY N/A 09/08/2021   Procedure: CORONARY BALLOON ANGIOPLASTY - ABORTED;  Surgeon: Darron Deatrice LABOR, MD;  Location: [facility];  Service: Cardiovascular;  Laterality: N/A;   CORONARY STENT INTERVENTION N/A 09/10/2021   Procedure: CORONARY STENT INTERVENTION;  Surgeon: Darron Deatrice LABOR, MD;  Location: [facility];  Service: Cardiovascular;  Laterality: N/A;   ESOPHAGOGASTRODUODENOSCOPY N/A 07/20/2023   Procedure: EGD (ESOPHAGOGASTRODUODENOSCOPY);  Surgeon: Unk Corinn Skiff, MD;  Location[facility];  Service: Endoscopy;  Laterality: N/A;   ESOPHAGOGASTRODUODENOSCOPY (EGD) WITH PROPOFOL  N/A 11/01/2021   Procedure: ESOPHAGOGASTRODUODENOSCOPY (EGD) WITH PROPOFOL ;  Surgeon: Unk Corinn Skiff, MD;  Location: [facility];  Service: Gastroenterology;  Laterality: N/A;   ESOPHAGOGASTRODUODENOSCOPY (EGD) WITH PROPOFOL  N/A 03/11/2022   Procedure: ESOPHAGOGASTRODUODENOSCOPY (EGD) WITH PROPOFOL ;  Surgeon: Unk Corinn Skiff, MD;  Location: [facility];  Service: Gastroenterology;  Laterality: N/A;   LEFT HEART CATH AND CORONARY ANGIOGRAPHY N/A 09/08/2021   Procedure: LEFT HEART CATH AND CORONARY ANGIOGRAPHY;  Surgeon: Fernand Denyse LABOR, MD;  Location: [facility];  Service: Cardiovascular;  Laterality: N/A;   LEFT HEART CATH AND CORONARY ANGIOGRAPHY N/A 06/14/2023   Procedure: LEFT HEART CATH AND CORONARY ANGIOGRAPHY;  Surgeon: Darron Deatrice LABOR, MD;  Location: [facility];  Service: Cardiovascular;  Laterality: N/A;   PERIPHERAL VASCULAR INTERVENTION  10/15/2021   Procedure: PERIPHERAL VASCULAR INTERVENTION;  Surgeon: Darron Deatrice LABOR, MD;  [facility];  Service: Cardiovascular;;   POLYPECTOMY  03/18/2023   Procedure: POLYPECTOMY;  Surgeon: Unk Corinn Skiff, MD;  Location: [facility] ENDOSCOPY;  Service: Gastroenterology;;   TONGUE SURGERY      Allergies  Allergies  Allergen Reactions   Aspirin  Other  (See Comments)    Bleeding ulcers   Amoxicill-Clarithro-Lansopraz Nausea And Vomiting   Amoxicillin Rash    Home Medications    Prior to Admission medications   Medication Sig Start Date End Date Taking? Authorizing Provider  atorvastatin  (LIPITOR ) 80 MG tablet TAKE 1 TABLET BY MOUTH EVERY DAY 12/17/22   Hammock, Tylene, NP  buPROPion  (WELLBUTRIN  SR) 150 MG 12 hr tablet TAKE 1 TABLET BY MOUTH TWICE A DAY 02/04/23   Hammock, Sheri, NP  clopidogrel  (PLAVIX ) 75 MG tablet Take 75 mg by mouth daily.    [provider]  escitalopram  (LEXAPRO ) 10 MG tablet Take 10 mg by mouth daily. 05/25/23   [provider]  ezetimibe  (ZETIA ) 10 MG tablet Take 1 tablet (10 mg total) by mouth daily. 09/30/23 12/29/23  Darron Deatrice LABOR, MD  folic acid  (FOLVITE ) 1 MG tablet Take 1 tablet (1 mg total) by mouth daily. 06/15/23   Amin, Sumayya, MD  gabapentin  (NEURONTIN ) 300 MG capsule Take 300 mg by mouth 3 (three) times daily.    [provider]  metoprolol  tartrate (LOPRESSOR ) 25 MG tablet Take 1 tablet (25 mg total) by mouth 2 (two) times daily. 06/15/23   Amin, Sumayya, MD  Multiple Vitamin (MULTIVITAMIN WITH MINERALS) TABS tablet Take 1 tablet by mouth daily. 06/15/23   Amin, Sumayya, MD  pantoprazole  (PROTONIX ) 40 MG tablet Take 1 tablet (40 mg total) by mouth 2 (two) times daily before a  meal. 09/30/23   Darron Deatrice LABOR, MD  thiamine  (VITAMIN B-1) 100 MG tablet Take 1 tablet (100 mg total) by mouth daily. 06/15/23   Amin, Sumayya, MD  varenicline  (CHANTIX  CONTINUING MONTH PAK) 1 MG tablet Take 1 tablet (1 mg total) by mouth 2 (two) times daily. 05/02/23   Darron Deatrice LABOR, MD    Physical Exam    Vital Signs:  Sonya Harmon does not have vital signs available for review today.  Given telephonic nature of communication, physical exam is limited. AAOx3. NAD. Normal affect.  Speech and respirations are unlabored.  Accessory Clinical Findings    None  Assessment & Plan    1.   Preoperative Cardiovascular Risk Assessment: According to the Revised Cardiac Risk Index (RCRI), her Perioperative Risk of Major Cardiac Event is (%): 0.9. Her Functional Capacity in METs is: 7.99 according to the Duke Activity Status Index (DASI). The patient is doing well from a cardiac perspective. Therefore, based on ACC/AHA guidelines, the patient would be at acceptable risk for the planned procedure without further cardiovascular testing.   The patient was advised that if she develops new symptoms prior to surgery to contact our office to arrange for a follow-up visit, and she verbalized understanding.  Per Dr. Arida, she may hold Plavix  for 5 days and should resume as soon as hemodynamically stable post procedure.   A copy of this note will be routed to requesting surgeon.  Time:   Today, I have spent 10 minutes with the patient with telehealth technology discussing medical history, symptoms, and management plan.     Sonya EMERSON Bane, NP-C  01/03/2024, 9:03 AM [address], [address] [place], [place] [address] Office [phone] Fax [phone]

## 2024-01-20 DIAGNOSIS — Z8711 Personal history of peptic ulcer disease: Secondary | ICD-10-CM | POA: Diagnosis not present

## 2024-01-20 DIAGNOSIS — K2990 Gastroduodenitis, unspecified, without bleeding: Secondary | ICD-10-CM | POA: Diagnosis not present

## 2024-01-20 DIAGNOSIS — I10 Essential (primary) hypertension: Secondary | ICD-10-CM | POA: Diagnosis not present

## 2024-01-20 DIAGNOSIS — R1310 Dysphagia, unspecified: Secondary | ICD-10-CM | POA: Diagnosis not present

## 2024-01-20 DIAGNOSIS — K222 Esophageal obstruction: Secondary | ICD-10-CM | POA: Diagnosis not present

## 2024-04-19 ENCOUNTER — Ambulatory Visit: Payer: Self-pay | Admitting: Cardiovascular Disease

## 2024-04-19 ENCOUNTER — Encounter: Payer: Self-pay | Admitting: *Deleted

## 2024-04-19 NOTE — Progress Notes (Unsigned)
 "    Cardiology Office Note   Date:  04/19/2024   ID:  Sonya Harmon, DOB 05-27-1970, MRN [id number]  PCP:  [name], MD  Cardiologist:   [name], MD   No chief complaint on file.     History of Present Illness: Sonya Harmon is a 54 y.o. female who presents for a follow-up visit regarding coronary artery disease and peripheral arterial disease. She has history of tongue cancer, tobacco use, alcohol abuse and hyperlipidemia. She was hospitalized in June, 2023 with unstable angina.  She underwent cardiac catheterization by Dr. Fernand from the right femoral artery which showed severe mid RCA stenosis.  PCI was planned but patient had a large groin hematoma.  She was noted to have significant bilateral iliac disease.  She underwent staged RCA PCI via the right radial artery.    The patient had significant bilateral leg claudication due to iliac disease. Angiography was performed in July 2023 which showed severely calcified aorta with no obstructive disease.  There was severe ostial left renal artery stenosis, no significant iliac disease on the right side and severe left external iliac artery stenosis extending into the distal left common iliac artery.  I performed successful drug-eluting stent placement to the left external iliac artery and drug-coated balloon angioplasty to the distal left common iliac artery.  A stent was not placed there to avoid jailing the internal iliac artery which was severely diseased at the ostium.  Postprocedure duplex showed normal iliac velocities.  She was hospitalized in July 2023 with syncope due to blood loss anemia.   She was seen in July, 2024 for lower extremity edema as well as burning sensation in both thighs and legs especially on the right side.  ABI was mildly reduced on the right side with borderline disease in the right common iliac artery.  Angiography in August 2024 showed patent left iliac stent with no obstructive disease.  On the  right side, there was moderate common iliac artery stenosis not significant by gradient with minimal SFA and popliteal disease.  She was hospitalized in March with chest pain and mildly elevated troponin.  Cardiac catheterization showed widely patent mid RCA stent with no significant restenosis with stable moderate proximal RCA disease.  No obstructive disease overall.  EF was normal by echo.  LVEDP was 14.  Elevated troponin was felt to be due to supply demand ischemia.  She has severe esophagitis followed by gastroenterology.  She has been doing reasonably well with no chest pain or worsening dyspnea.  She reports stable bilateral leg claudication.  She was prescribed Chantix  but could not quit smoking and she had some side effects.  Past Medical History:  Diagnosis Date   Alcohol abuse    Coronary artery disease 09/2021   s/p PCI to RCA in setting of unstable angina   Esophagitis    H/O heart artery stent    History of heavy alcohol consumption    History of tongue cancer    Myocardial infarction (HCC) 06/11/2023   NSTEMI   NSTEMI (non-ST elevated myocardial infarction) (HCC)    PAD (peripheral artery disease)    Palpitations 07/15/2017   Paroxysmal sinus tachycardia 07/16/2017   Polyneuropathy 12/02/2017   PRES (posterior reversible encephalopathy syndrome) 12/02/2017   Recurrent major depressive disorder 01/01/2018   Seizure (HCC) 12/22/2017   SIRS (systemic inflammatory response syndrome) (HCC) 08/01/2017   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   Suicidal ideation 01/01/2018  Tobacco abuse    Unspecified mood (affective) disorder 01/01/2018   Wears dentures    full upper    Past Surgical History:  Procedure Laterality Date   ABDOMINAL AORTOGRAM W/LOWER EXTREMITY N/A 10/15/2021   Procedure: ABDOMINAL AORTOGRAM W/LOWER EXTREMITY;  Surgeon: Darron Deatrice LABOR, MD;  [facility]: [facility];  Service: Cardiovascular;  Laterality: N/A;   ABDOMINAL  AORTOGRAM W/LOWER EXTREMITY N/A 12/30/2022   Procedure: ABDOMINAL AORTOGRAM W/LOWER EXTREMITY;  Surgeon: Darron Deatrice LABOR, MD;  Location: [facility];  Service: Cardiovascular;  Laterality: N/A;   BILATERAL SALPINGECTOMY     BREAST BIOPSY Left 06/03/2023   US  Bx, Heart Clip, Path pending   BREAST BIOPSY Left 06/03/2023   US  LT BREAST BX W LOC DEV 1ST LESION IMG BX SPEC US  GUIDE 06/03/2023 [facility]   COLONOSCOPY WITH PROPOFOL  N/A 03/11/2022   Procedure: COLONOSCOPY WITH PROPOFOL ;  Surgeon: Unk Corinn Skiff, MD;  Location: [facility];  Service: Gastroenterology;  Laterality: N/A;   COLONOSCOPY WITH PROPOFOL  N/A 03/18/2023   Procedure: COLONOSCOPY WITH PROPOFOL ;  Surgeon: Unk Corinn Skiff, MD;  Location: [facility] ENDOSCOPY;  Service: Gastroenterology;  Laterality: N/A;   CORONARY BALLOON ANGIOPLASTY N/A 09/08/2021   Procedure: CORONARY BALLOON ANGIOPLASTY - ABORTED;  Surgeon: Darron Deatrice LABOR, MD;  Location: [facility];  Service: Cardiovascular;  Laterality: N/A;   CORONARY STENT INTERVENTION N/A 09/10/2021   Procedure: CORONARY STENT INTERVENTION;  Surgeon: Darron Deatrice LABOR, MD;  Location: [facility];  Service: Cardiovascular;  Laterality: N/A;   ESOPHAGOGASTRODUODENOSCOPY N/A 07/20/2023   Procedure: EGD (ESOPHAGOGASTRODUODENOSCOPY);  Surgeon: Unk Corinn Skiff, MD;  Location: [facility];  Service: Endoscopy;  Laterality: N/A;   ESOPHAGOGASTRODUODENOSCOPY (EGD) WITH PROPOFOL  N/A 11/01/2021   Procedure: ESOPHAGOGASTRODUODENOSCOPY (EGD) WITH PROPOFOL ;  Surgeon: Unk Corinn Skiff, MD;  Location: [facility];  Service: Gastroenterology;  Laterality: N/A;   ESOPHAGOGASTRODUODENOSCOPY (EGD) WITH PROPOFOL  N/A 03/11/2022   Procedure: ESOPHAGOGASTRODUODENOSCOPY (EGD) WITH PROPOFOL ;  Surgeon: Unk Corinn Skiff, MD;  Location: [facility];  Service: Gastroenterology;  Laterality: N/A;   LEFT HEART CATH AND CORONARY ANGIOGRAPHY N/A 09/08/2021    Procedure: LEFT HEART CATH AND CORONARY ANGIOGRAPHY;  Surgeon: Fernand Denyse LABOR, MD;  Location: [facility];  Service: Cardiovascular;  Laterality: N/A;   LEFT HEART CATH AND CORONARY ANGIOGRAPHY N/A 06/14/2023   Procedure: LEFT HEART CATH AND CORONARY ANGIOGRAPHY;  Surgeon: Darron Deatrice LABOR, MD;  Location: [facility];  Service: Cardiovascular;  Laterality: N/A;   PERIPHERAL VASCULAR INTERVENTION  10/15/2021   Procedure: PERIPHERAL VASCULAR INTERVENTION;  Surgeon: Darron Deatrice LABOR, MD;  [facility];  Service: Cardiovascular;;   POLYPECTOMY  03/18/2023   Procedure: POLYPECTOMY;  Surgeon: Unk Corinn Skiff, MD;  Location: [facility];  Service: Gastroenterology;;   TONGUE SURGERY       Current Outpatient Medications  Medication Sig Dispense Refill   atorvastatin  (LIPITOR ) 80 MG tablet TAKE 1 TABLET BY MOUTH EVERY DAY 90 tablet 0   buPROPion  (WELLBUTRIN  SR) 150 MG 12 hr tablet TAKE 1 TABLET BY MOUTH TWICE A DAY 180 tablet 1   clopidogrel  (PLAVIX ) 75 MG tablet Take 75 mg by mouth daily.     escitalopram  (LEXAPRO ) 10 MG tablet Take 10 mg by mouth daily.     ezetimibe  (ZETIA ) 10 MG tablet Take 1 tablet (10 mg total) by mouth daily. 90 tablet 3   folic acid  (FOLVITE ) 1 MG tablet Take 1 tablet (1 mg total) by mouth daily. 90 tablet  1   gabapentin  (NEURONTIN ) 300 MG capsule Take 300 mg by mouth 3 (three) times daily.     metoprolol  tartrate (LOPRESSOR ) 25 MG tablet Take 1 tablet (25 mg total) by mouth 2 (two) times daily. 60 tablet 2   Multiple Vitamin (MULTIVITAMIN WITH MINERALS) TABS tablet Take 1 tablet by mouth daily. 90 tablet 1   pantoprazole  (PROTONIX ) 40 MG tablet Take 1 tablet (40 mg total) by mouth 2 (two) times daily before a meal. 180 tablet 3   thiamine  (VITAMIN B-1) 100 MG tablet Take 1 tablet (100 mg total) by mouth daily. 90 tablet 1   varenicline  (CHANTIX  CONTINUING MONTH PAK) 1 MG tablet Take 1 tablet (1 mg total) by mouth 2 (two) times daily.  60 tablet 1   No current facility-administered medications for this visit.    Allergies:   Aspirin , Amoxicill-clarithro-lansopraz, and Amoxicillin    Social History:  The patient  reports that she quit smoking about 10 months ago. Her smoking use included cigarettes. She started smoking about 37 years ago. She has a 18.4 pack-year smoking history. She has quit using smokeless tobacco. She reports that she does not currently use alcohol after a past usage of about 42.0 standard drinks of alcohol per week. She reports that she does not currently use drugs after having used the following drugs: Marijuana.   Family History:  The patient's family history includes Aneurysm in her sister; Cancer in her father and mother; Stroke in her sister.    ROS:  Please see the history of present illness.   Otherwise, review of systems are positive for none.   All other systems are reviewed and negative.    PHYSICAL EXAM: VS:  There were no vitals taken for this visit. , BMI There is no height or weight on file to calculate BMI. GEN: Well-developed in no acute distress. HEENT: normal  Neck: no JVD or masses.  Left carotid bruit. Cardiac: RRR; no murmurs, rubs, or gallops,no edema  Respiratory:  clear to auscultation bilaterally, normal work of breathing GI: soft, nontender, nondistended, + BS MS: no deformity or atrophy  Skin: warm and dry, no rash Neuro:  Strength and sensation are intact Psych: Alert and oriented with normal affect. Vascular: Femoral pulse: +1 bilaterally.    EKG:  EKG is ordered today.  Sinus rhythm Possible Left atrial enlargement ST elevation consider inferior injury or acute infarct When compared with ECG of 01-Jul-2023 15:01, T wave amplitude has decreased in Lateral leads ST changes are not new   Recent Labs: 06/15/2023: Magnesium  2.4 06/22/2023: ALT 17; BUN 9; Creatinine, Ser 0.70; Potassium 3.5; Sodium 135 07/01/2023: Hemoglobin 12.8; Platelets 536    Lipid Panel     Component Value Date/Time   CHOL 202 (H) 04/01/2023 1059   TRIG 54 04/01/2023 1059   HDL 101 04/01/2023 1059   CHOLHDL 2.0 04/01/2023 1059   CHOLHDL 1.9 09/09/2021 0541   VLDL 6 09/09/2021 0541   LDLCALC 91 04/01/2023 1059      Wt Readings from Last 3 Encounters:  09/30/23 97 lb 12.8 oz (44.4 kg)  07/20/23 97 lb (44 kg)  07/01/23 96 lb 12.8 oz (43.9 kg)           No data to display            ASSESSMENT AND PLAN:  1.   Peripheral arterial disease: Status post  left iliac stent.  Angiography last year showed patent left iliac stent and moderate right iliac disease.  There was no evidence of obstructive disease to explain her symptoms of burning and numbness in her legs.  I refilled clopidogrel .  2. Chronic venous insufficiency: Continue leg elevation and knee-high support stockings.    3.  Coronary artery disease involving native coronary arteries without angina: She had RCA stent in 2023.  Cardiac catheterization in March of this year showed patent RCA stents with stable moderate proximal disease.  4.  Tobacco use: I again discussed the importance of smoking cessation .  She could not quit with Chantix   5.  Hyperlipidemia: Continue treatment with atorvastatin  80 mg daily.  Most recent lipid profile showed an LDL of 91.  I elected to add ezetimibe  10 mg daily.  Recheck labs in 2 months.  6.  Left carotid bruit: Carotid Doppler in January 2024 showed mild nonobstructive disease.    Disposition: Follow-up in 6 months.  Signed,  [name], MD  04/19/2024 3:07 PM    [facility] "

## 2024-05-05 ENCOUNTER — Telehealth: Payer: Self-pay | Admitting: Cardiovascular Disease

## 2024-05-05 NOTE — Telephone Encounter (Signed)
 Pt c/o of Chest Pain: STAT if active (IN THIS MOMENT) CP, including tightness, pressure, jaw pain, shoulder/upper arm/back pain, SOB, nausea, and vomiting.  1. Are you having CP right now (tightness, pressure, or discomfort)? No   2. Are you experiencing any other symptoms (ex. SOB, nausea, vomiting, sweating)? SOB   3. How long have you been experiencing CP? Started when she fell two weeks ago   4. Is your CP continuous or coming and going? Continuous   5. Have you taken Nitroglycerin ? No   6. If CP returns before callback, please consider calling 911. ?   Please advise.
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
